# Patient Record
Sex: Female | Born: 1955
Health system: Southern US, Community
[De-identification: ages and names within clinical notes are randomized; demographics above are authoritative.]

## PROBLEM LIST (undated history)

## (undated) DIAGNOSIS — Z86011 Personal history of benign neoplasm of the brain: Secondary | ICD-10-CM

## (undated) DIAGNOSIS — E785 Hyperlipidemia, unspecified: Secondary | ICD-10-CM

## (undated) DIAGNOSIS — R112 Nausea with vomiting, unspecified: Secondary | ICD-10-CM

## (undated) DIAGNOSIS — I491 Atrial premature depolarization: Secondary | ICD-10-CM

## (undated) DIAGNOSIS — O24419 Gestational diabetes mellitus in pregnancy, unspecified control: Secondary | ICD-10-CM

## (undated) DIAGNOSIS — R55 Syncope and collapse: Secondary | ICD-10-CM

## (undated) DIAGNOSIS — H9192 Unspecified hearing loss, left ear: Secondary | ICD-10-CM

## (undated) DIAGNOSIS — G43909 Migraine, unspecified, not intractable, without status migrainosus: Secondary | ICD-10-CM

## (undated) DIAGNOSIS — I4891 Unspecified atrial fibrillation: Secondary | ICD-10-CM

## (undated) DIAGNOSIS — Z9889 Other specified postprocedural states: Secondary | ICD-10-CM

## (undated) DIAGNOSIS — K219 Gastro-esophageal reflux disease without esophagitis: Secondary | ICD-10-CM

## (undated) DIAGNOSIS — G709 Myoneural disorder, unspecified: Secondary | ICD-10-CM

## (undated) DIAGNOSIS — I471 Supraventricular tachycardia, unspecified: Secondary | ICD-10-CM

## (undated) DIAGNOSIS — T783XXA Angioneurotic edema, initial encounter: Secondary | ICD-10-CM

## (undated) DIAGNOSIS — R51 Headache: Secondary | ICD-10-CM

## (undated) DIAGNOSIS — M952 Other acquired deformity of head: Secondary | ICD-10-CM

## (undated) DIAGNOSIS — N3010 Interstitial cystitis (chronic) without hematuria: Secondary | ICD-10-CM

## (undated) DIAGNOSIS — E039 Hypothyroidism, unspecified: Secondary | ICD-10-CM

## (undated) DIAGNOSIS — I499 Cardiac arrhythmia, unspecified: Secondary | ICD-10-CM

## (undated) DIAGNOSIS — I493 Ventricular premature depolarization: Secondary | ICD-10-CM

## (undated) DIAGNOSIS — L509 Urticaria, unspecified: Secondary | ICD-10-CM

## (undated) HISTORY — DX: Supraventricular tachycardia: I47.1

## (undated) HISTORY — DX: Urticaria, unspecified: L50.9

## (undated) HISTORY — PX: OTHER SURGICAL HISTORY: SHX169

## (undated) HISTORY — DX: Atrial premature depolarization: I49.1

## (undated) HISTORY — DX: Angioneurotic edema, initial encounter: T78.3XXA

## (undated) HISTORY — PX: BRAIN SURGERY: SHX531

## (undated) HISTORY — DX: Supraventricular tachycardia, unspecified: I47.10

## (undated) HISTORY — PX: OVARIAN CYST SURGERY: SHX726

## (undated) HISTORY — DX: Ventricular premature depolarization: I49.3

---

## 1978-06-02 HISTORY — PX: WISDOM TOOTH EXTRACTION: SHX21

## 2002-06-02 HISTORY — PX: ACOUSTIC NEUROMA RESECTION: SHX5713

## 2003-06-03 HISTORY — PX: HEMANGIOMA EXCISION: SHX1734

## 2003-06-03 HISTORY — PX: MASTOIDECTOMY: SHX711

## 2010-05-16 ENCOUNTER — Ambulatory Visit
Admission: RE | Admit: 2010-05-16 | Discharge: 2010-05-16 | Payer: Self-pay | Source: Home / Self Care | Attending: Urology | Admitting: Urology

## 2010-08-12 LAB — POCT HEMOGLOBIN-HEMACUE: Hemoglobin: 13.1 g/dL (ref 12.0–15.0)

## 2013-06-10 ENCOUNTER — Other Ambulatory Visit: Payer: Self-pay | Admitting: Obstetrics and Gynecology

## 2013-06-10 DIAGNOSIS — Z1231 Encounter for screening mammogram for malignant neoplasm of breast: Secondary | ICD-10-CM

## 2013-07-18 ENCOUNTER — Telehealth (HOSPITAL_COMMUNITY): Payer: Self-pay | Admitting: *Deleted

## 2013-07-18 NOTE — Telephone Encounter (Signed)
Telephoned patient at home # and left message to return call to [facility] 

## 2013-07-19 ENCOUNTER — Ambulatory Visit (HOSPITAL_COMMUNITY): Payer: Self-pay

## 2013-07-19 ENCOUNTER — Ambulatory Visit (HOSPITAL_COMMUNITY): Payer: Self-pay | Attending: Obstetrics and Gynecology

## 2013-07-20 ENCOUNTER — Telehealth (HOSPITAL_COMMUNITY): Payer: Self-pay | Admitting: *Deleted

## 2013-07-20 NOTE — Telephone Encounter (Signed)
Telephoned patient at home # and left message to return call to [facility] 

## 2013-07-26 ENCOUNTER — Ambulatory Visit (HOSPITAL_COMMUNITY)
Admission: RE | Admit: 2013-07-26 | Discharge: 2013-07-26 | Disposition: A | Discharge status: Home / Self Care | Payer: Self-pay | Source: Ambulatory Visit | Attending: Obstetrics and Gynecology | Admitting: Obstetrics and Gynecology

## 2013-07-26 ENCOUNTER — Ambulatory Visit (HOSPITAL_COMMUNITY): Payer: Self-pay | Attending: Obstetrics and Gynecology

## 2013-07-26 NOTE — Progress Notes (Signed)
Opened in error by Mayo Ao, RN.

## 2013-08-16 ENCOUNTER — Ambulatory Visit (HOSPITAL_COMMUNITY)
Admission: RE | Admit: 2013-08-16 | Discharge: 2013-08-16 | Disposition: A | Discharge status: Home / Self Care | Payer: Self-pay | Source: Ambulatory Visit | Attending: Obstetrics and Gynecology | Admitting: Obstetrics and Gynecology

## 2013-08-16 ENCOUNTER — Other Ambulatory Visit: Payer: Self-pay | Admitting: Obstetrics and Gynecology

## 2013-08-16 ENCOUNTER — Encounter (HOSPITAL_COMMUNITY): Payer: Self-pay

## 2013-08-16 VITALS — BP 110/64 | Temp 98.3°F | Ht 71.0 in | Wt 190.8 lb

## 2013-08-16 DIAGNOSIS — Z1239 Encounter for other screening for malignant neoplasm of breast: Secondary | ICD-10-CM

## 2013-08-16 DIAGNOSIS — Z1231 Encounter for screening mammogram for malignant neoplasm of breast: Secondary | ICD-10-CM

## 2013-08-16 NOTE — Patient Instructions (Signed)
Taught Kristin Bradford how to perform BSE and gave educational materials to take home. Patient did not need a Pap smear today due to last Pap smear was in February 2014 per patient. Told patient about free cervical cancer screenings to receive a Pap smear if would like one next year. Let her know [facility] will cover Pap smears every 3 years unless has a history of abnormal Pap smears. Let [facility] know [facility] will [facility] with her within the next couple weeks with results. [name] verbalized understanding. Patient escorted to mammography for a screening mammogram.  [place], Arvil Chaco, RN 2:54 PM

## 2013-08-16 NOTE — Progress Notes (Signed)
No complaints today.  Pap Smear:  Pap smear not completed today. Last Pap smear was February 2014 at [facility] and normal per patient. Per patient has a history of an abnormal Pap smear 30 years ago that required a colposcopy and [facility] for follow-up. Patient stated all Pap smears since the CKC have been normal. No Pap smear results in EPIC.  Physical exam: Breasts Breasts symmetrical. No skin abnormalities bilateral breasts. No nipple retraction bilateral breasts. No nipple discharge bilateral breasts. No lymphadenopathy. No lumps palpated bilateral breasts. No complaints of pain or tenderness on exam. Patient escorted to mammography for a screening mammogram.        Pelvic/Bimanual No Pap smear completed today since last Pap smear was February 2014 per patient. Pap smear not indicated per [facility] guidelines.

## 2013-12-01 ENCOUNTER — Other Ambulatory Visit: Payer: Self-pay | Admitting: Family Medicine

## 2013-12-01 DIAGNOSIS — D333 Benign neoplasm of cranial nerves: Secondary | ICD-10-CM

## 2013-12-12 ENCOUNTER — Other Ambulatory Visit: Payer: Self-pay

## 2014-01-31 HISTORY — PX: CATARACT EXTRACTION W/ INTRAOCULAR LENS IMPLANT: SHX1309

## 2014-04-03 ENCOUNTER — Encounter (HOSPITAL_COMMUNITY): Payer: Self-pay

## 2014-05-03 ENCOUNTER — Other Ambulatory Visit: Payer: Self-pay | Admitting: Family Medicine

## 2014-05-03 ENCOUNTER — Other Ambulatory Visit (HOSPITAL_COMMUNITY)
Admission: RE | Admit: 2014-05-03 | Discharge: 2014-05-03 | Disposition: A | Discharge status: Home / Self Care | Payer: PRIVATE HEALTH INSURANCE | Source: Ambulatory Visit | Attending: Family Medicine | Admitting: Family Medicine

## 2014-05-03 DIAGNOSIS — Z1151 Encounter for screening for human papillomavirus (HPV): Secondary | ICD-10-CM | POA: Insufficient documentation

## 2014-05-03 DIAGNOSIS — Z124 Encounter for screening for malignant neoplasm of cervix: Secondary | ICD-10-CM | POA: Diagnosis present

## 2014-05-04 ENCOUNTER — Other Ambulatory Visit: Payer: Self-pay | Admitting: Family Medicine

## 2014-05-04 DIAGNOSIS — D259 Leiomyoma of uterus, unspecified: Secondary | ICD-10-CM

## 2014-05-04 DIAGNOSIS — Z86018 Personal history of other benign neoplasm: Secondary | ICD-10-CM

## 2014-05-05 LAB — CYTOLOGY - PAP

## 2014-05-08 ENCOUNTER — Ambulatory Visit
Admission: RE | Admit: 2014-05-08 | Discharge: 2014-05-08 | Disposition: A | Discharge status: Home / Self Care | Payer: PRIVATE HEALTH INSURANCE | Source: Ambulatory Visit | Attending: Family Medicine | Admitting: Family Medicine

## 2014-05-08 DIAGNOSIS — D259 Leiomyoma of uterus, unspecified: Secondary | ICD-10-CM

## 2014-05-10 ENCOUNTER — Ambulatory Visit
Admission: RE | Admit: 2014-05-10 | Discharge: 2014-05-10 | Disposition: A | Discharge status: Home / Self Care | Payer: PRIVATE HEALTH INSURANCE | Source: Ambulatory Visit | Attending: Family Medicine | Admitting: Family Medicine

## 2014-05-10 DIAGNOSIS — Z86018 Personal history of other benign neoplasm: Secondary | ICD-10-CM

## 2014-05-10 MED ORDER — GADOBENATE DIMEGLUMINE 529 MG/ML IV SOLN
17.0000 mL | Freq: Once | INTRAVENOUS | Status: AC | PRN
  Administered 2014-05-10: 17 mL via INTRAVENOUS

## 2014-11-01 HISTORY — PX: COLONOSCOPY: SHX174

## 2014-11-01 HISTORY — PX: ESOPHAGOGASTRODUODENOSCOPY: SHX1529

## 2015-05-02 ENCOUNTER — Other Ambulatory Visit: Payer: Self-pay | Admitting: Urology

## 2015-05-03 HISTORY — PX: HEMORRHOID BANDING: SHX5850

## 2015-05-03 MED ORDER — TRIAMCINOLONE ACETONIDE 40 MG/ML IJ SUSP: Freq: Once | INTRAMUSCULAR | Status: AC

## 2015-05-03 MED ORDER — PHENAZOPYRIDINE HCL 200 MG PO TABS: Freq: Once | ORAL | Status: AC

## 2015-05-16 ENCOUNTER — Encounter (HOSPITAL_BASED_OUTPATIENT_CLINIC_OR_DEPARTMENT_OTHER): Payer: Self-pay | Admitting: *Deleted

## 2015-05-17 NOTE — Progress Notes (Signed)
Unable to reach pt.  Lm on pt phone to arrive at [place] and npo after mn.  From current hx pt would need hg.

## 2015-05-18 ENCOUNTER — Other Ambulatory Visit: Payer: Self-pay

## 2015-05-18 ENCOUNTER — Encounter (HOSPITAL_COMMUNITY)
Admission: AD | Disposition: A | Discharge status: Home / Self Care | Payer: Self-pay | Source: Ambulatory Visit | Attending: Internal Medicine

## 2015-05-18 ENCOUNTER — Ambulatory Visit (HOSPITAL_BASED_OUTPATIENT_CLINIC_OR_DEPARTMENT_OTHER): Payer: PRIVATE HEALTH INSURANCE | Admitting: Anesthesiology

## 2015-05-18 ENCOUNTER — Inpatient Hospital Stay (HOSPITAL_BASED_OUTPATIENT_CLINIC_OR_DEPARTMENT_OTHER)
Admission: AD | Admit: 2015-05-18 | Discharge: 2015-05-19 | DRG: 982 | Disposition: A | Discharge status: Home / Self Care | Payer: PRIVATE HEALTH INSURANCE | Source: Ambulatory Visit | Attending: Internal Medicine | Admitting: Internal Medicine

## 2015-05-18 ENCOUNTER — Encounter (HOSPITAL_BASED_OUTPATIENT_CLINIC_OR_DEPARTMENT_OTHER): Payer: Self-pay | Admitting: Anesthesiology

## 2015-05-18 DIAGNOSIS — E039 Hypothyroidism, unspecified: Secondary | ICD-10-CM

## 2015-05-18 DIAGNOSIS — Z8632 Personal history of gestational diabetes: Secondary | ICD-10-CM

## 2015-05-18 DIAGNOSIS — Z888 Allergy status to other drugs, medicaments and biological substances status: Secondary | ICD-10-CM

## 2015-05-18 DIAGNOSIS — Z886 Allergy status to analgesic agent status: Secondary | ICD-10-CM

## 2015-05-18 DIAGNOSIS — Z880 Allergy status to penicillin: Secondary | ICD-10-CM

## 2015-05-18 DIAGNOSIS — R682 Dry mouth, unspecified: Secondary | ICD-10-CM | POA: Diagnosis present

## 2015-05-18 DIAGNOSIS — I9789 Other postprocedural complications and disorders of the circulatory system, not elsewhere classified: Principal | ICD-10-CM | POA: Diagnosis present

## 2015-05-18 DIAGNOSIS — D259 Leiomyoma of uterus, unspecified: Secondary | ICD-10-CM | POA: Diagnosis present

## 2015-05-18 DIAGNOSIS — Z91041 Radiographic dye allergy status: Secondary | ICD-10-CM

## 2015-05-18 DIAGNOSIS — Z86011 Personal history of benign neoplasm of the brain: Secondary | ICD-10-CM | POA: Diagnosis not present

## 2015-05-18 DIAGNOSIS — N3010 Interstitial cystitis (chronic) without hematuria: Secondary | ICD-10-CM | POA: Diagnosis present

## 2015-05-18 DIAGNOSIS — H918X2 Other specified hearing loss, left ear: Secondary | ICD-10-CM | POA: Diagnosis present

## 2015-05-18 DIAGNOSIS — I4891 Unspecified atrial fibrillation: Secondary | ICD-10-CM | POA: Diagnosis not present

## 2015-05-18 DIAGNOSIS — Z635 Disruption of family by separation and divorce: Secondary | ICD-10-CM

## 2015-05-18 DIAGNOSIS — Y838 Other surgical procedures as the cause of abnormal reaction of the patient, or of later complication, without mention of misadventure at the time of the procedure: Secondary | ICD-10-CM | POA: Diagnosis present

## 2015-05-18 DIAGNOSIS — F419 Anxiety disorder, unspecified: Secondary | ICD-10-CM | POA: Diagnosis present

## 2015-05-18 DIAGNOSIS — I480 Paroxysmal atrial fibrillation: Secondary | ICD-10-CM | POA: Diagnosis not present

## 2015-05-18 DIAGNOSIS — Z887 Allergy status to serum and vaccine status: Secondary | ICD-10-CM

## 2015-05-18 DIAGNOSIS — Z91013 Allergy to seafood: Secondary | ICD-10-CM

## 2015-05-18 DIAGNOSIS — Z885 Allergy status to narcotic agent status: Secondary | ICD-10-CM | POA: Diagnosis not present

## 2015-05-18 DIAGNOSIS — E785 Hyperlipidemia, unspecified: Secondary | ICD-10-CM | POA: Diagnosis present

## 2015-05-18 DIAGNOSIS — D333 Benign neoplasm of cranial nerves: Secondary | ICD-10-CM

## 2015-05-18 DIAGNOSIS — K219 Gastro-esophageal reflux disease without esophagitis: Secondary | ICD-10-CM | POA: Diagnosis present

## 2015-05-18 DIAGNOSIS — K2210 Ulcer of esophagus without bleeding: Secondary | ICD-10-CM | POA: Diagnosis present

## 2015-05-18 HISTORY — DX: Other acquired deformity of head: M95.2

## 2015-05-18 HISTORY — DX: Benign neoplasm of cranial nerves: D33.3

## 2015-05-18 HISTORY — PX: CYSTO WITH HYDRODISTENSION: SHX5453

## 2015-05-18 HISTORY — DX: Interstitial cystitis (chronic) without hematuria: N30.10

## 2015-05-18 HISTORY — DX: Personal history of benign neoplasm of the brain: Z86.011

## 2015-05-18 HISTORY — DX: Unspecified hearing loss, left ear: H91.92

## 2015-05-18 HISTORY — DX: Unspecified atrial fibrillation: I48.91

## 2015-05-18 LAB — COMPREHENSIVE METABOLIC PANEL
ALBUMIN: 4 g/dL (ref 3.5–5.0)
ALT: 19 U/L (ref 14–54)
AST: 34 U/L (ref 15–41)
Alkaline Phosphatase: 46 U/L (ref 38–126)
Anion gap: 11 (ref 5–15)
BUN: 11 mg/dL (ref 6–20)
CHLORIDE: 101 mmol/L (ref 101–111)
CO2: 27 mmol/L (ref 22–32)
CREATININE: 0.77 mg/dL (ref 0.44–1.00)
Calcium: 8.8 mg/dL — ABNORMAL LOW (ref 8.9–10.3)
GFR calc Af Amer: >60 mL/min (ref >60)
GLUCOSE: 167 mg/dL — AB (ref 65–99)
POTASSIUM: 3.7 mmol/L (ref 3.5–5.1)
Sodium: 139 mmol/L (ref 135–145)
Total Bilirubin: 0.8 mg/dL (ref 0.3–1.2)
Total Protein: 7.1 g/dL (ref 6.5–8.1)

## 2015-05-18 LAB — T4, FREE: Free T4: 0.86 ng/dL (ref 0.61–1.12)

## 2015-05-18 LAB — POCT I-STAT, CHEM 8
BUN: 15 mg/dL (ref 6–20)
Calcium, Ion: 1.21 mmol/L (ref 1.12–1.23)
Chloride: 104 mmol/L (ref 101–111)
Creatinine, Ser: 0.7 mg/dL (ref 0.44–1.00)
GLUCOSE: 100 mg/dL — AB (ref 65–99)
HCT: 43 % (ref 36.0–46.0)
HEMOGLOBIN: 14.6 g/dL (ref 12.0–15.0)
POTASSIUM: 3.9 mmol/L (ref 3.5–5.1)
Sodium: 143 mmol/L (ref 135–145)
TCO2: 26 mmol/L (ref 0–100)

## 2015-05-18 LAB — CBC WITH DIFFERENTIAL/PLATELET
Basophils Absolute: 0 10*3/uL (ref 0.0–0.1)
Basophils Relative: 0 %
EOS ABS: 0 10*3/uL (ref 0.0–0.7)
EOS PCT: 0 %
HCT: 39.1 % (ref 36.0–46.0)
Hemoglobin: 13 g/dL (ref 12.0–15.0)
LYMPHS ABS: 0.4 10*3/uL — AB (ref 0.7–4.0)
LYMPHS PCT: 5 %
MCH: 29.2 pg (ref 26.0–34.0)
MCHC: 33.2 g/dL (ref 30.0–36.0)
MCV: 87.9 fL (ref 78.0–100.0)
MONO ABS: 0 10*3/uL — AB (ref 0.1–1.0)
MONOS PCT: 0 %
Neutro Abs: 6.6 10*3/uL (ref 1.7–7.7)
Neutrophils Relative %: 95 %
PLATELETS: 257 10*3/uL (ref 150–400)
RBC: 4.45 MIL/uL (ref 3.87–5.11)
RDW: 12.9 % (ref 11.5–15.5)
WBC: 7 10*3/uL (ref 4.0–10.5)

## 2015-05-18 LAB — MRSA PCR SCREENING: MRSA BY PCR: NEGATIVE

## 2015-05-18 LAB — TROPONIN I

## 2015-05-18 LAB — TSH: TSH: 0.469 u[IU]/mL (ref 0.350–4.500)

## 2015-05-18 LAB — MAGNESIUM: MAGNESIUM: 1.8 mg/dL (ref 1.7–2.4)

## 2015-05-18 SURGERY — CYSTOSCOPY, WITH BLADDER HYDRODISTENSION
Anesthesia: General

## 2015-05-18 MED ORDER — DILTIAZEM HCL 30 MG PO TABS
30.0000 mg | ORAL_TABLET | Freq: Three times a day (TID) | ORAL | Status: DC
  Administered 2015-05-18 – 2015-05-19 (×3): 30 mg via ORAL
  Filled 2015-05-18 (×4): qty 1

## 2015-05-18 MED ORDER — PROPOFOL 10 MG/ML IV BOLUS
INTRAVENOUS | Status: DC | PRN
  Administered 2015-05-18: 200 mg via INTRAVENOUS

## 2015-05-18 MED ORDER — ACETAMINOPHEN 500 MG PO TABS
ORAL_TABLET | ORAL | Status: AC
  Filled 2015-05-18: qty 2

## 2015-05-18 MED ORDER — DILTIAZEM LOAD VIA INFUSION
10.0000 mg | Freq: Once | INTRAVENOUS | Status: DC
  Filled 2015-05-18 (×2): qty 10

## 2015-05-18 MED ORDER — STERILE WATER FOR IRRIGATION IR SOLN
Status: DC | PRN
  Administered 2015-05-18: 3000 mL

## 2015-05-18 MED ORDER — LIDOCAINE HCL (CARDIAC) 20 MG/ML IV SOLN
INTRAVENOUS | Status: AC
  Filled 2015-05-18: qty 5

## 2015-05-18 MED ORDER — LEVOTHYROXINE SODIUM 50 MCG PO TABS
50.0000 ug | ORAL_TABLET | Freq: Every day | ORAL | Status: DC
  Administered 2015-05-19: 50 ug via ORAL
  Filled 2015-05-18: qty 1

## 2015-05-18 MED ORDER — METOCLOPRAMIDE HCL 5 MG/ML IJ SOLN
INTRAMUSCULAR | Status: AC
  Filled 2015-05-18: qty 2

## 2015-05-18 MED ORDER — ACETAMINOPHEN 10 MG/ML IV SOLN
INTRAVENOUS | Status: AC
  Filled 2015-05-18: qty 100

## 2015-05-18 MED ORDER — TRIAMCINOLONE ACETONIDE 40 MG/ML IJ SUSP
INTRAMUSCULAR | Status: DC | PRN
  Administered 2015-05-18: 40 mg via INTRAMUSCULAR

## 2015-05-18 MED ORDER — KETOROLAC TROMETHAMINE 30 MG/ML IJ SOLN
INTRAMUSCULAR | Status: AC
  Filled 2015-05-18: qty 1

## 2015-05-18 MED ORDER — BUPIVACAINE HCL 0.5 % IJ SOLN
INTRAMUSCULAR | Status: DC | PRN
  Administered 2015-05-18: 10 mL

## 2015-05-18 MED ORDER — LIDOCAINE HCL (CARDIAC) 20 MG/ML IV SOLN
INTRAVENOUS | Status: DC | PRN
  Administered 2015-05-18: 60 mg via INTRAVENOUS

## 2015-05-18 MED ORDER — PANTOPRAZOLE SODIUM 40 MG PO TBEC
40.0000 mg | DELAYED_RELEASE_TABLET | Freq: Every day | ORAL | Status: DC
  Administered 2015-05-18: 40 mg via ORAL
  Filled 2015-05-18: qty 1

## 2015-05-18 MED ORDER — CIPROFLOXACIN IN D5W 400 MG/200ML IV SOLN
INTRAVENOUS | Status: AC
  Filled 2015-05-18: qty 200

## 2015-05-18 MED ORDER — DEXAMETHASONE SODIUM PHOSPHATE 4 MG/ML IJ SOLN
INTRAMUSCULAR | Status: DC | PRN
Start: 2015-05-18 — End: 2015-05-18
  Administered 2015-05-18: 10 mg via INTRAVENOUS

## 2015-05-18 MED ORDER — DEXAMETHASONE SODIUM PHOSPHATE 10 MG/ML IJ SOLN
INTRAMUSCULAR | Status: AC
  Filled 2015-05-18: qty 1

## 2015-05-18 MED ORDER — ONDANSETRON HCL 4 MG/2ML IJ SOLN
INTRAMUSCULAR | Status: AC
  Filled 2015-05-18: qty 2

## 2015-05-18 MED ORDER — HYDROMORPHONE HCL 1 MG/ML IJ SOLN
0.2500 mg | INTRAMUSCULAR | Status: DC | PRN
  Filled 2015-05-18: qty 1

## 2015-05-18 MED ORDER — PRAVASTATIN SODIUM 20 MG PO TABS
20.0000 mg | ORAL_TABLET | Freq: Every day | ORAL | Status: DC
  Administered 2015-05-18: 20 mg via ORAL
  Filled 2015-05-18: qty 1

## 2015-05-18 MED ORDER — KETOROLAC TROMETHAMINE 30 MG/ML IJ SOLN
INTRAMUSCULAR | Status: DC | PRN
  Administered 2015-05-18: 30 mg via INTRAVENOUS

## 2015-05-18 MED ORDER — DEXTROSE 5 % IV SOLN: 5.0000 mg/h | INTRAVENOUS | Status: DC

## 2015-05-18 MED ORDER — PHENAZOPYRIDINE HCL 200 MG PO TABS
ORAL | Status: DC | PRN
  Administered 2015-05-18: 15 mL via INTRAVESICAL

## 2015-05-18 MED ORDER — LABETALOL HCL 5 MG/ML IV SOLN
INTRAVENOUS | Status: AC
  Filled 2015-05-18: qty 4

## 2015-05-18 MED ORDER — HYDROMORPHONE HCL 4 MG/ML IJ SOLN
INTRAMUSCULAR | Status: AC
  Filled 2015-05-18: qty 1

## 2015-05-18 MED ORDER — MIDAZOLAM HCL 5 MG/5ML IJ SOLN
INTRAMUSCULAR | Status: DC | PRN
  Administered 2015-05-18: 2 mg via INTRAVENOUS

## 2015-05-18 MED ORDER — LACTATED RINGERS IV SOLN
1000.0000 mL | INTRAVENOUS | Status: DC
  Administered 2015-05-18: 1000 mL via INTRAVENOUS

## 2015-05-18 MED ORDER — BUPIVACAINE HCL (PF) 0.25 % IJ SOLN
INTRAMUSCULAR | Status: AC
  Filled 2015-05-18: qty 30

## 2015-05-18 MED ORDER — ENOXAPARIN SODIUM 40 MG/0.4ML ~~LOC~~ SOLN
40.0000 mg | SUBCUTANEOUS | Status: DC
  Filled 2015-05-18: qty 0.4

## 2015-05-18 MED ORDER — FENTANYL CITRATE (PF) 100 MCG/2ML IJ SOLN
INTRAMUSCULAR | Status: AC
  Filled 2015-05-18: qty 2

## 2015-05-18 MED ORDER — DILTIAZEM HCL 100 MG IV SOLR
5.0000 mg/h | INTRAVENOUS | Status: DC
  Administered 2015-05-18: 15 mg/h via INTRAVENOUS
  Administered 2015-05-18: 10 mg/h via INTRAVENOUS
  Filled 2015-05-18 (×2): qty 100

## 2015-05-18 MED ORDER — ONDANSETRON HCL 4 MG/2ML IJ SOLN: 4.0000 mg | Freq: Four times a day (QID) | INTRAMUSCULAR | Status: DC | PRN

## 2015-05-18 MED ORDER — ACETAMINOPHEN 325 MG PO TABS: 650.0000 mg | ORAL_TABLET | Freq: Four times a day (QID) | ORAL | Status: DC | PRN

## 2015-05-18 MED ORDER — DILTIAZEM HCL 100 MG IV SOLR: 10.0000 mg/h | INTRAVENOUS | Status: DC

## 2015-05-18 MED ORDER — PROMETHAZINE HCL 25 MG/ML IJ SOLN
6.2500 mg | INTRAMUSCULAR | Status: DC | PRN
  Filled 2015-05-18: qty 1

## 2015-05-18 MED ORDER — ACETAMINOPHEN 500 MG PO TABS
1000.0000 mg | ORAL_TABLET | Freq: Once | ORAL | Status: AC
  Administered 2015-05-18: 1000 mg via ORAL
  Filled 2015-05-18: qty 2

## 2015-05-18 MED ORDER — BUPIVACAINE HCL (PF) 0.5 % IJ SOLN
INTRAMUSCULAR | Status: AC
  Filled 2015-05-18: qty 30

## 2015-05-18 MED ORDER — MIDAZOLAM HCL 2 MG/2ML IJ SOLN
INTRAMUSCULAR | Status: AC
  Filled 2015-05-18: qty 2

## 2015-05-18 MED ORDER — ONDANSETRON HCL 4 MG/2ML IJ SOLN
4.0000 mg | Freq: Once | INTRAMUSCULAR | Status: AC
  Administered 2015-05-18: 4 mg via INTRAVENOUS
  Filled 2015-05-18: qty 2

## 2015-05-18 MED ORDER — METOCLOPRAMIDE HCL 5 MG/ML IJ SOLN
INTRAMUSCULAR | Status: DC | PRN
  Administered 2015-05-18: 10 mg via INTRAVENOUS

## 2015-05-18 MED ORDER — ACETAMINOPHEN 325 MG PO TABS: 650.0000 mg | ORAL_TABLET | ORAL | Status: DC | PRN

## 2015-05-18 MED ORDER — ONDANSETRON HCL 4 MG/2ML IJ SOLN
INTRAMUSCULAR | Status: DC | PRN
  Administered 2015-05-18: 4 mg via INTRAVENOUS

## 2015-05-18 MED ORDER — DEXLANSOPRAZOLE 60 MG PO CPDR
60.0000 mg | DELAYED_RELEASE_CAPSULE | Freq: Every day | ORAL | Status: DC
  Administered 2015-05-19: 60 mg via ORAL
  Filled 2015-05-18 (×3): qty 1

## 2015-05-18 MED ORDER — LABETALOL HCL 5 MG/ML IV SOLN
2.5000 mg | Freq: Once | INTRAVENOUS | Status: AC
  Administered 2015-05-18: 2.5 mg via INTRAVENOUS
  Filled 2015-05-18: qty 4

## 2015-05-18 MED ORDER — LACTATED RINGERS IV SOLN
INTRAVENOUS | Status: DC
  Administered 2015-05-18 (×2): via INTRAVENOUS
  Filled 2015-05-18: qty 1000

## 2015-05-18 MED ORDER — CIPROFLOXACIN IN D5W 400 MG/200ML IV SOLN
400.0000 mg | INTRAVENOUS | Status: AC
  Administered 2015-05-18: 400 mg via INTRAVENOUS
  Filled 2015-05-18: qty 200

## 2015-05-18 MED ORDER — HYDROMORPHONE HCL 1 MG/ML IJ SOLN
INTRAMUSCULAR | Status: DC | PRN
  Administered 2015-05-18: 1 mg via INTRAVENOUS

## 2015-05-18 MED ORDER — PROPOFOL 10 MG/ML IV BOLUS
INTRAVENOUS | Status: AC
  Filled 2015-05-18: qty 20

## 2015-05-18 SURGICAL SUPPLY — 30 items
ADAPTER CATH WHT DISP STRL (CATHETERS) IMPLANT
ADPR CATH MP STRL LF DISP BD (CATHETERS)
BAG DRAIN URO-CYSTO SKYTR STRL (DRAIN) ×3 IMPLANT
BAG DRN UROCATH (DRAIN) ×1
BOOTIES KNEE HIGH SLOAN (MISCELLANEOUS) ×3 IMPLANT
CANISTER SUCT LVC 12 LTR MEDI- (MISCELLANEOUS) IMPLANT
CATH ROBINSON RED A/P 16FR (CATHETERS) IMPLANT
CLOTH BEACON ORANGE TIMEOUT ST (SAFETY) ×3 IMPLANT
ELECT REM PT RETURN 9FT ADLT (ELECTROSURGICAL) ×3
ELECTRODE REM PT RTRN 9FT ADLT (ELECTROSURGICAL) ×1 IMPLANT
GLOVE BIO SURGEON STRL SZ7.5 (GLOVE) ×3 IMPLANT
GLOVE ECLIPSE 8.0 STRL XLNG CF (GLOVE) IMPLANT
GOWN STRL REUS W/ TWL LRG LVL3 (GOWN DISPOSABLE) ×1 IMPLANT
GOWN STRL REUS W/ TWL XL LVL3 (GOWN DISPOSABLE) ×1 IMPLANT
GOWN STRL REUS W/TWL LRG LVL3 (GOWN DISPOSABLE) ×3
GOWN STRL REUS W/TWL XL LVL3 (GOWN DISPOSABLE) ×3
KIT ROOM TURNOVER WOR (KITS) ×3 IMPLANT
MANIFOLD NEPTUNE II (INSTRUMENTS) IMPLANT
NDL SAFETY ECLIPSE 18X1.5 (NEEDLE) ×1 IMPLANT
NDL SPNL 22GX7 QUINCKE BK (NEEDLE) IMPLANT
NEEDLE HYPO 18GX1.5 SHARP (NEEDLE) ×3
NEEDLE HYPO 22GX1.5 SAFETY (NEEDLE) IMPLANT
NEEDLE SPNL 22GX7 QUINCKE BK (NEEDLE) IMPLANT
NS IRRIG 500ML POUR BTL (IV SOLUTION) IMPLANT
PACK CYSTO (CUSTOM PROCEDURE TRAY) ×3 IMPLANT
SYR 20CC LL (SYRINGE) ×6 IMPLANT
SYR BULB IRRIGATION 50ML (SYRINGE) IMPLANT
TUBE CONNECTING 12'X1/4 (SUCTIONS)
TUBE CONNECTING 12X1/4 (SUCTIONS) IMPLANT
WATER STERILE IRR 3000ML UROMA (IV SOLUTION) ×3 IMPLANT

## 2015-05-18 NOTE — Progress Notes (Signed)
Ms. Owens underwent uneventful cystoscopy and hydro-distension this morning. Postoperatively, she complained of nausea. Her HR and BP were normal and she was moved from first stage to second stage recovery(up in a chair and not monitored) in expectation of discharge. About 1305, I was asked to see her for continued nausea. She stated she did not feel well and felt like her heart rate was elevated. She was placed back on cardiac monitor which showed HR about 130 and irregular. Labetalol 2.5 mg IV ordered and after five minutes of no effect a 12 lead ECG was ordered which showed apparent atrial fibrillation at rate of 128. Blood pressure was 137/83 at this time.  Blood pressure has remained stable and she denies SOB. Carotid massage and coughing were ineffective. A diltiazem bolus and infusion were ordered. Oxygen administered.  Dr. Gaynelle Arabian was informed of need for admission.  Hospitalist (Dr. Dyann Kief) was consulted and will accept patient once she is transferred to [facility] stepdown unit.

## 2015-05-18 NOTE — H&P (Signed)
Active Problems Problems  1. Chronic interstitial cystitis without hematuria (N30.10) 2. Increased urinary frequency (R35.0) 3. Urinary urgency (R39.15)  History of Present Illness    Kristin Bradford is a 59 year-old female with a hx of IC. Previous PUFF score of 16, and now PUFF=15 in 2016. and a complaint " IC flair". She has urinary frequency, difficulty postponing urination, dysuria, and nocturia, as well as weak urinary stream. She has GERD, anxiety, and arrhythmia, history of gestational diabetes, hypothyroidism,.      Note that she is postop acoustic neuroma removal on 2004, with chronic dry mouth. Note that she has particular stressors including recent marital separation, brother with cancer, and mother worsening dementia. In addition she has GERD, fatigue, and headaches.      Urodynamics in the past have shown a maximum bladder capacity of 202 cc. The bladder is hypersensitive, with first sensation at 83 cc. The lead point pressure is non-an abdominal pressure of 105 cm water. The ECG is normal. Fluoroscopy was not accomplished secondary to iodine allergy and seafood allergy.     This patient appears to have a small capacity, hypersensitive bladder. She had pain with catheterization, and a maximum capacity. There is no instability, no stress incontinence, normal EMG.     She has not taken Buspar. Biggest problem is frequency and urgency. She failed Vesicare because of headache.   Past Medical History Problems  1. History of Anxiety (F41.9) 2. History of Hearing Loss 3. History of cardiac arrhythmia (Z86.79) 4. History of diabetes mellitus (Z86.39) 5. History of esophageal reflux (Z87.19) 6. History of hypercholesterolemia (Z86.39) 7. History of hypothyroidism (Z86.39)  Surgical History Problems  1. History of Bladder Irrigation 2. History of Complete Colonoscopy 3. History of Cystoscopy With Dilation Of Bladder 4. History of Cystoscopy With Dilation Of Bladder 5. History of Dental  Surgery 6. History of Dilation And Curettage 7. History of Facial Surgery 8. History of Vestibular Nerve Section  Current Meds 1. Dexilant 60 MG Oral Capsule Delayed Release;  Therapy: (Recorded:30Nov2016) to Recorded 2. Levothyroxine Sodium 50 MCG Oral Tablet;  Therapy: (Recorded:02Nov2011) to Recorded 3. Melatonin TABS;  Therapy: (Recorded:02Nov2011) to Recorded 4. Pravastatin Sodium 20 MG Oral Tablet;  Therapy: (Recorded:02Nov2011) to Recorded 5. Prometrium 100 MG Oral Capsule;  Therapy: 13Sep2011 to Recorded  Allergies Medication  1. Betadine RTU SOLN 2. BuSpar TABS 3. Gelnique GEL 4. Penicillins 5. Tetanus Toxoids 6. VESIcare TABS  Family History Problems  1. Family history of Family Health Status Number Of Children   1 daughter 2. Family history of FH Unobtainable - Patient Adopted  Social History Problems  1. Alcohol Use (History)   2 per wk 2. Caffeine Use   1-2 per day 3. Never a smoker 4. Occupation:   Medical records//talent buyer/booker 5. Separated from significant other (Z63.5) 6. Denied: History of Tobacco Use  Alcohol Use (History)   - 2 per wk    Caffeine Use   - 1-2 per day    Marital History - Currently Married    Occupation:   - Water quality scientist buyer/booker   Review of Systems Genitourinary, constitutional, skin, eye, otolaryngeal, hematologic/lymphatic, cardiovascular, pulmonary, endocrine, musculoskeletal, gastrointestinal, neurological and psychiatric system(s) were reviewed and pertinent findings if present are noted and are otherwise negative.  Genitourinary: urinary frequency, urinary urgency, dysuria and nocturia, but no hematuria, no urethral discharge, no foul smelling urine and no pelvic pain and weak flow.  Gastrointestinal: heartburn, but no diarrhea and no constipation.  Constitutional: feeling tired (fatigue), but  no fever and no night sweats.  Neurological: headache.    Physical Exam Constitutional: Well  nourished and well developed . No acute distress.  ENT:. The ears and nose are normal in appearance.  Pulmonary: No respiratory distress and normal respiratory rhythm and effort.  Cardiovascular: Heart rate and rhythm are normal . No peripheral edema.  Abdomen: The abdomen is soft and nontender. No masses are palpated. No CVA tenderness. No hernias are palpable. No hepatosplenomegaly noted.  Lymphatics: The femoral and inguinal nodes are not enlarged or tender.  Skin: Normal skin turgor, no visible rash and no visible skin lesions.  Neuro/Psych:. Mood and affect are appropriate.    Assessment Assessed  1. Chronic interstitial cystitis without hematuria (N30.10) 2. Increased urinary frequency (R35.0) 3. Urinary urgency (R39.15)  Kristin Bradford is a 59 yo female with IC, and recent stressors of marital separation, brother with cancer, and mother with worsening dementia. She is living by herself,and feels the need for cysto, HOD, which has helped her IC symptoms of urinary frequency, urgency, dysuria, weak stream, and nocturia in the past, PUFF score = 15 today.    Note her additionsl problems of vestibvular surgery (L) 2004; titanium tooth implant, D and C; GERD.   Plan Cysto, HOD, place Pyridium, Marcaine, and Marcaine/Kenalog.   Signatures Electronically signed by : Carolan Clines, M.D.; May 02 2015  3:51PM EST

## 2015-05-18 NOTE — Progress Notes (Signed)
Patient c/o nausea, MD Denenny notified order for Zofran obtained. Will continue to monitor. C.Roddie Riegler,RN

## 2015-05-18 NOTE — Anesthesia Postprocedure Evaluation (Signed)
Anesthesia Post Note  Patient: Kristin Bradford  Procedure(s) Performed: Procedure(s) (LRB): CYSTOSCOPY/HYDRODISTENSION (N/A)  Patient location during evaluation: PACU Anesthesia Type: General Level of consciousness: awake and alert Pain management: pain level controlled Vital Signs Assessment: post-procedure vital signs reviewed and stable Respiratory status: spontaneous breathing, nonlabored ventilation, respiratory function stable and patient connected to nasal cannula oxygen Cardiovascular status: blood pressure returned to baseline and stable Postop Assessment: no signs of nausea or vomiting Anesthetic complications: no Comments: Nausea requiring second dose of zofran. She declined phenergan which she says can make her blood pressure fall.    Last Vitals:  Filed Vitals:   05/18/15 0747 05/18/15 0945  BP: 103/59 104/66  Pulse: 70 66  Temp: 36.9 C 36.5 C  Resp: 16 11    Last Pain: There were no vitals filed for this visit.               Keylor Rands J

## 2015-05-18 NOTE — Transfer of Care (Signed)
Immediate Anesthesia Transfer of Care Note  Patient: Kristin Bradford  Procedure(s) Performed: Procedure(s): CYSTOSCOPY/HYDRODISTENSION (N/A)  Patient Location: PACU  Anesthesia Type:General  Level of Consciousness: awake, alert , oriented and patient cooperative  Airway & Oxygen Therapy: Patient Spontanous Breathing and Patient connected to nasal cannula oxygen  Post-op Assessment: Report given to RN and Post -op Vital signs reviewed and stable  Post vital signs: Reviewed and stable  Last Vitals:  Filed Vitals:   05/18/15 0747  BP: 103/59  Pulse: 70  Temp: 36.9 C  Resp: 16    Complications: No apparent anesthesia complications

## 2015-05-18 NOTE — Progress Notes (Signed)
Ekg obtained, Dr. Delma Post in room. Placed on stretcher and moved back into recovery area and place on monitor.

## 2015-05-18 NOTE — Addendum Note (Signed)
Addendum  created 05/18/15 1356 by Franne Grip, MD   Modules edited: Orders

## 2015-05-18 NOTE — Progress Notes (Signed)
Received call from anesthesiologist Dr. Delma Post regarding Kristin Bradford who presented for Cystoscopy and HOD, due to recurrent chronic cystitis. After procedure patient developed atrial fibrillation and [facility] was called to assist with treatment and work up. Per. Dr. Delma Post VS are stable and patient main complaint is nausea. Plan is to admit her to step down for further evaluation and treatment.  Plan: -will need troponin, TSH, CBC, CMET and 2-D echo -CHADsVasc Score is 1 -patient is on cardizem drip currently  -Cardiology consult if she failed to convert or further needs  -?? Use of adenosine    -urology will follow her along  Barton Dubois [id number]

## 2015-05-18 NOTE — H&P (Signed)
[facility] History and Physical  Kristin Bradford [id number] DOB: 01-01-1956 DOA: 05/18/2015  Referring physician: Dr. Gaynelle Arabian PCP: No primary care provider on file.   Chief Complaint: new a fib  HPI: Kristin Bradford is a 59 y.o. female  With PMHx of IC, acoustic neuroma.  She was undergoing a cystoscopy today.  After the procedure, she developed nausea and dizziness.  She then described feels of her heart racing.  EKG showed atrial fibrillation with rate of 125-150.  She was given dose of labetalol.  And then started on cardiazem gtt.    Patient denies h/o atrial fib or heart racing in this same manner although, patient has h/o PVCs-- has worn a holter monitor x 2 (In [place]).  She also had a normal stress test in early 2000s.  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  Once arriving to the SDU at [facility], she converted around 4:30 back to NSR.     Hospitalist were asked to admit for atrial fib evaluation.     Review of Systems:  All systems reviewed, negative unless stated above    Past Medical History  Diagnosis Date  . IC (interstitial cystitis)   . Acquired deafness of left ear     S/P  RESECTION ACOUSTIC NEUROMA  2004  . 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 Acquired facial asymmetry     post surgery   Past Surgical History  Procedure Laterality Date  . Craniotomy w/ left acoustic neuroma removal  2004  . Left facial angioma removal /  left mastoidectomy  2005  . Cysto/  hydrodistention/  instillation therapy  x4  last one 05-16-2010    since 1990's  . Colonoscopy  JUNE 1016  . Upper gi endoscopy  JUNE 1016  . Cataract extraction w/ intraocular lens implant  SEPTEMBER 1015   Social History:  reports that she has never smoked. She has never used smokeless tobacco. She reports that she drinks alcohol. She reports that she does not use illicit drugs.  Allergies  Allergen Reactions  . Betadine [Povidone  Iodine] Rash  . Codeine   . Fentanyl Nausea And Vomiting  . Penicillins Itching  . Tetanus Toxoids Nausea And Vomiting and Other (See Comments)    High fever    Family History  Problem Relation Age of Onset  . Adopted: Yes    Prior to Admission medications   Medication Sig Start Date End Date Taking? Authorizing Provider  acetaminophen (TYLENOL) 325 MG tablet Take 650 mg by mouth every 6 (six) hours as needed.   Yes Historical Provider, MD  dexlansoprazole (DEXILANT) 60 MG capsule Take 60 mg by mouth daily.   Yes Historical Provider, MD  levothyroxine (SYNTHROID, LEVOTHROID) 50 MCG tablet Take 50 mcg by mouth daily before breakfast.   Yes Historical Provider, MD  pravastatin (PRAVACHOL) 20 MG tablet Take 20 mg by mouth at bedtime.   Yes Historical Provider, MD  progesterone (PROMETRIUM) 100 MG capsule Take 100 mg by mouth at bedtime.   Yes Historical Provider, MD   Physical Exam: Filed Vitals:   05/18/15 1515 05/18/15 1530 05/18/15 1545 05/18/15 1600  BP: 111/69  135/77   Pulse: 142 135 137 145  Temp: 98.1 F (36.7 C)     TempSrc:      Resp: 16 16 17 14   Height:      Weight:      SpO2: 99% 100% 99% 100%  Wt Readings from Last 3 Encounters:  05/18/15 87.544 kg (193 lb)  05/10/14 83.915 kg (185 lb)  08/16/13 86.546 kg (190 lb 12.8 oz)    General:  Appears calm and comfortable Eyes: PERRL, normal lids, irises & conjunctiva ENT: grossly normal hearing, lips & tongue Neck: no LAD, masses or thyromegaly Cardiovascular: RRR, no m/r/g. No LE edema. Telemetry: currently sinus Respiratory: CTA bilaterally, no w/r/r. Normal respiratory effort. Abdomen: soft, ntnd Skin: no rash or induration seen on limited exam Musculoskeletal: grossly normal tone BUE/BLE Psychiatric: grossly normal mood and affect, speech fluent and appropriate Neurologic: grossly non-focal.          Labs on Admission:  Basic Metabolic Panel:  Recent Labs Lab 05/18/15 0817  NA 143  K 3.9  CL  104  GLUCOSE 100*  BUN 15  CREATININE 0.70   Liver Function Tests: No results for input(s): AST, ALT, ALKPHOS, BILITOT, PROT, ALBUMIN in the last 168 hours. No results for input(s): LIPASE, AMYLASE in the last 168 hours. No results for input(s): AMMONIA in the last 168 hours. CBC:  Recent Labs Lab 05/18/15 0817  HGB 14.6  HCT 43.0   Cardiac Enzymes: No results for input(s): CKTOTAL, CKMB, CKMBINDEX, TROPONINI in the last 168 hours.  BNP (last 3 results) No results for input(s): BNP in the last 8760 hours.  ProBNP (last 3 results) No results for input(s): PROBNP in the last 8760 hours.  CBG: No results for input(s): GLUCAP in the last 168 hours.  Radiological Exams on Admission: No results found.  EKG: Independently reviewed. A fib rate of 128 on EKG at 13:30---- NSR on EKG at 4:51PM  Assessment/Plan Active Problems:   Atrial fibrillation, new onset (HCC)   Atrial fibrillation (HCC)   Hypothyroidism   Interstitial cystitis   Acoustic neuroma (HCC)   Atrial fib- new - converted on cardiazem gtt when arrived at hospital -[place] VASC2-- female (1) + old infarct from MRI in 2015 (2)--- 3 -echo -full dose lovenox -cycle CE  Hypothyroid -check TSH -on synthroid  IC -s/p cystoscopy  Acoustic neuroma -s/p resection  Fibroid -takes progesterone per GYN  GERD with esophageal erosions -protonix vs patient's home med  HLD Statin   Code Status: full DVT Prophylaxis: Family Communication: daughter and husband at bedside Disposition Plan:   Time spent: 44 min  [facility] Pager [phone]

## 2015-05-18 NOTE — Progress Notes (Signed)
Patient continues to c/o nausea, and now states she feels dizzy  And like her heart is racing. MD Denenny notified. Per MD patient placed on monitor. HR shows 125, BP 139/84, O2 100% RA. Denenny notified of vitals, order for Labatelol given. Will continue to monitor patient. CLexine Baton

## 2015-05-18 NOTE — Interval H&P Note (Signed)
History and Physical Interval Note:  05/18/2015 8:48 AM  Kristin Bradford  has presented today for surgery, with the diagnosis of INTERSTITIAL CYSTITIS  The various methods of treatment have been discussed with the patient and family. After consideration of risks, benefits and other options for treatment, the patient has consented to  Procedure(s): CYSTOSCOPY/HYDRODISTENSION (N/A) as a surgical intervention .  The patient's history has been reviewed, patient examined, no change in status, stable for surgery.  I have reviewed the patient's chart and labs.  Questions were answered to the patient's satisfaction.     Gar Kristin Bradford

## 2015-05-18 NOTE — Addendum Note (Signed)
Addendum  created 05/18/15 1452 by Franne Grip, MD   Modules edited: Clinical Notes, Orders   Clinical Notes:  File: [id number]; Pend: [id number]; Raelyn Number: [id number]; Pend: [id number]

## 2015-05-18 NOTE — Anesthesia Preprocedure Evaluation (Signed)
Anesthesia Evaluation  Patient identified by MRN, date of birth, ID band Patient awake    Reviewed: Allergy & Precautions, NPO status , Patient's Chart, lab work & pertinent test results  Airway Mallampati: II  TM Distance: >3 FB Neck ROM: Full    Dental no notable dental hx.    Pulmonary neg pulmonary ROS,    Pulmonary exam normal breath sounds clear to auscultation       Cardiovascular negative cardio ROS Normal cardiovascular exam Rhythm:Regular Rate:Normal     Neuro/Psych negative neurological ROS  negative psych ROS   GI/Hepatic negative GI ROS, Neg liver ROS,   Endo/Other  negative endocrine ROS  Renal/GU negative Renal ROS  negative genitourinary   Musculoskeletal negative musculoskeletal ROS (+)   Abdominal   Peds negative pediatric ROS (+)  Hematology negative hematology ROS (+)   Anesthesia Other Findings   Reproductive/Obstetrics negative OB ROS                             Anesthesia Physical Anesthesia Plan  ASA: I  Anesthesia Plan: General   Post-op Pain Management:    Induction: Intravenous  Airway Management Planned: LMA  Additional Equipment:   Intra-op Plan:   Post-operative Plan: Extubation in OR  Informed Consent: I have reviewed the patients History and Physical, chart, labs and discussed the procedure including the risks, benefits and alternatives for the proposed anesthesia with the patient or authorized representative who has indicated his/her understanding and acceptance.   Dental advisory given  Plan Discussed with: CRNA  Anesthesia Plan Comments:         Anesthesia Quick Evaluation  

## 2015-05-19 ENCOUNTER — Inpatient Hospital Stay (HOSPITAL_COMMUNITY): Payer: PRIVATE HEALTH INSURANCE

## 2015-05-19 DIAGNOSIS — I4891 Unspecified atrial fibrillation: Secondary | ICD-10-CM

## 2015-05-19 DIAGNOSIS — I480 Paroxysmal atrial fibrillation: Secondary | ICD-10-CM

## 2015-05-19 LAB — BASIC METABOLIC PANEL
ANION GAP: 9 (ref 5–15)
BUN: 16 mg/dL (ref 6–20)
CHLORIDE: 105 mmol/L (ref 101–111)
CO2: 26 mmol/L (ref 22–32)
Calcium: 9.2 mg/dL (ref 8.9–10.3)
Creatinine, Ser: 0.74 mg/dL (ref 0.44–1.00)
GFR calc Af Amer: >60 mL/min (ref >60)
GFR calc non Af Amer: >60 mL/min (ref >60)
GLUCOSE: 138 mg/dL — AB (ref 65–99)
POTASSIUM: 4.1 mmol/L (ref 3.5–5.1)
Sodium: 140 mmol/L (ref 135–145)

## 2015-05-19 LAB — CBC
HEMATOCRIT: 37.2 % (ref 36.0–46.0)
HEMOGLOBIN: 12.3 g/dL (ref 12.0–15.0)
MCH: 29.2 pg (ref 26.0–34.0)
MCHC: 33.1 g/dL (ref 30.0–36.0)
MCV: 88.4 fL (ref 78.0–100.0)
Platelets: 253 10*3/uL (ref 150–400)
RBC: 4.21 MIL/uL (ref 3.87–5.11)
RDW: 13.3 % (ref 11.5–15.5)
WBC: 12.5 10*3/uL — ABNORMAL HIGH (ref 4.0–10.5)

## 2015-05-19 LAB — TROPONIN I
Troponin I: <0.03 ng/mL (ref <0.031)
Troponin I: <0.03 ng/mL (ref <0.031)

## 2015-05-19 MED ORDER — APIXABAN 5 MG PO TABS
5.0000 mg | ORAL_TABLET | Freq: Two times a day (BID) | ORAL | Status: DC
  Administered 2015-05-19: 5 mg via ORAL
  Filled 2015-05-19: qty 1

## 2015-05-19 MED ORDER — DILTIAZEM HCL ER COATED BEADS 120 MG PO CP24: 120.0000 mg | ORAL_CAPSULE | Freq: Every day | ORAL | Status: DC

## 2015-05-19 MED ORDER — APIXABAN 5 MG PO TABS: 5.0000 mg | ORAL_TABLET | Freq: Two times a day (BID) | ORAL | Status: DC

## 2015-05-19 NOTE — Discharge Instructions (Signed)
CYSTOSCOPY HOME CARE INSTRUCTIONS  Activity: Rest for the remainder of the day.  Do not drive or operate equipment today.  You may resume normal activities in one to two days as instructed by your physician.   Meals: Drink plenty of liquids and eat light foods such as gelatin or soup this evening.  You may return to a normal meal plan tomorrow.  Return to Work: You may return to work in one to two days or as instructed by your physician.  Special Instructions / Symptoms: Call your physician if any of these symptoms occur:   -persistent or heavy bleeding  -bleeding which continues after first few urination  -large blood clots that are difficult to pass  -urine stream diminishes or stops completely  -fever equal to or higher than 101 degrees Farenheit.  -cloudy urine with a strong, foul odor  -severe pain  Females should always wipe from front to back after elimination.  You may feel some burning pain when you urinate.  This should disappear with time.  Applying moist heat to the lower abdomen or a hot tub bath may help relieve the pain. \  Follow-Up / Date of Return Visit to Your Physician:   Call for an appointment to arrange follow-up.  Patient Signature:  ________________________________________________________  Nurse's Signature:  ________________________________________________________  Post Anesthesia Home Care Instructions  Activity: Get plenty of rest for the remainder of the day. A responsible adult should stay with you for 24 hours following the procedure.  For the next 24 hours, DO NOT: -Drive a car -Paediatric nurse -Drink alcoholic beverages -Take any medication unless instructed by your physician -Make any legal decisions or sign important papers.  Meals: Start with liquid foods such as gelatin or soup. Progress to regular foods as tolerated. Avoid greasy, spicy, heavy foods. If nausea and/or vomiting occur, drink only clear liquids until the nausea and/or  vomiting subsides. Call your physician if vomiting continues.  Special Instructions/Symptoms: Your throat may feel dry or sore from the anesthesia or the breathing tube placed in your throat during surgery. If this causes discomfort, gargle with warm salt water. The discomfort should disappear within 24 hours.  If you had a scopolamine patch placed behind your ear for the management of post- operative nausea and/or vomiting:  1. The medication in the patch is effective for 72 hours, after which it should be removed.  Wrap patch in a tissue and discard in the trash. Wash hands thoroughly with soap and water. 2. You may remove the patch earlier than 72 hours if you experience unpleasant side effects which may include dry mouth, dizziness or visual disturbances. 3. Avoid touching the patch. Wash your hands with soap and water after contact with the patch.    Information on my medicine - ELIQUIS (apixaban)  This medication education was reviewed with me or my healthcare representative as part of my discharge preparation.    Why was Eliquis prescribed for you? Eliquis was prescribed for you to reduce the risk of a blood clot forming that can cause a stroke if you have a medical condition called atrial fibrillation (a type of irregular heartbeat).  What do You need to know about Eliquis ? Take your Eliquis TWICE DAILY - one tablet in the morning and one tablet in the evening with or without food. If you have difficulty swallowing the tablet whole please discuss with your pharmacist how to take the medication safely.  Take Eliquis exactly as prescribed by your doctor and DO  NOT stop taking Eliquis without talking to the doctor who prescribed the medication.  Stopping may increase your risk of developing a stroke.  Refill your prescription before you run out.  After discharge, you should have regular check-up appointments with your healthcare provider that is prescribing your Eliquis.  In the  future your dose may need to be changed if your kidney function or weight changes by a significant amount or as you get older.  What do you do if you miss a dose? If you miss a dose, take it as soon as you remember on the same day and resume taking twice daily.  Do not take more than one dose of ELIQUIS at the same time to make up a missed dose.  Important Safety Information A possible side effect of Eliquis is bleeding. You should call your healthcare provider right away if you experience any of the following: ? Bleeding from an injury or your nose that does not stop. ? Unusual colored urine (red or dark brown) or unusual colored stools (red or black). ? Unusual bruising for unknown reasons. ? A serious fall or if you hit your head (even if there is no bleeding).  Some medicines may interact with Eliquis and might increase your risk of bleeding or clotting while on Eliquis. To help avoid this, consult your healthcare provider or pharmacist prior to using any new prescription or non-prescription medications, including herbals, vitamins, non-steroidal anti-inflammatory drugs (NSAIDs) and supplements.  This website has more information on Eliquis (apixaban): http://www.eliquis.com/eliquis/home

## 2015-05-19 NOTE — Progress Notes (Signed)
No urologic events overnight. Tachycardia improving  Filed Vitals:   05/18/15 2036 05/19/15 0400 05/19/15 0746 05/19/15 0800  BP:  109/47  108/70  Pulse:  68  70  Temp: 98.2 F (36.8 C)  98.5 F (36.9 C)   TempSrc: Oral     Resp:  18  17  Height:      Weight:      SpO2:  96%  98%   I/O last 3 completed shifts: In: 1439.1 [I.V.:1439.1] Out: 1800 [Urine:1800]     NAD Soft NT ND No foley  CBC    Component Value Date/Time   WBC 12.5* 05/19/2015 0502   RBC 4.21 05/19/2015 0502   HGB 12.3 05/19/2015 0502   HCT 37.2 05/19/2015 0502   PLT 253 05/19/2015 0502   MCV 88.4 05/19/2015 0502   MCH 29.2 05/19/2015 0502   MCHC 33.1 05/19/2015 0502   RDW 13.3 05/19/2015 0502   LYMPHSABS 0.4* 05/18/2015 1657   MONOABS 0.0* 05/18/2015 1657   EOSABS 0.0 05/18/2015 1657   BASOSABS 0.0 05/18/2015 1657    BMP Latest Ref Rng 05/19/2015 05/18/2015 05/18/2015  Glucose 65 - 99 mg/dL 138(H) 167(H) 100(H)  BUN 6 - 20 mg/dL 16 11 15   Creatinine 0.44 - 1.00 mg/dL 0.74 0.77 0.70  Sodium 135 - 145 mmol/L 140 139 143  Potassium 3.5 - 5.1 mmol/L 4.1 3.7 3.9  Chloride 101 - 111 mmol/L 105 101 104  CO2 22 - 32 mmol/L 26 27 -  Calcium 8.9 - 10.3 mg/dL 9.2 8.8(L) -    POD 1 cysto, hydrodistension with post op afib -doing better -ok for d/c home from urology standpoint when cleared by internal medicine

## 2015-05-19 NOTE — Discharge Summary (Signed)
Physician Discharge Summary  Kristin Bradford [id number] DOB: [birth year] DOA: 05/18/2015  PCP: No primary care provider on file.  Admit date: 05/18/2015 Discharge date: 05/19/2015  Time spent: 35 minutes  Recommendations for Outpatient Follow-up:  1. 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 female  With PMHx of IC, acoustic neuroma. She was undergoing a cystoscopy today. After the procedure, she developed nausea and dizziness. She then described feels of her heart racing. EKG showed atrial fibrillation with rate of 125-150. She was given dose of labetalol. And then started on cardiazem gtt.   Patient denies h/o atrial fib or heart racing in this same manner although, patient has h/o PVCs-- has worn a holter monitor x 2 (In [place]). She also had a normal stress test in early 2000s. 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  Once arriving to the SDU at [facility], she converted around 4:30 back to NSR.    Hospital Course:  A fib- new after urology procedure -[place] VASC2-- female (1) + old infarcts from MRI in 2015 (2)--- 3 -cardiazem 120 CD -eliquis -echo: Left ventricle: The cavity size was normal. Wall thickness was normal. Systolic function was normal. The estimated ejection fraction was in the range of 60% to 65%. Wall motion was normal; there were no regional wall motion abnormalities. Left ventricular diastolic function parameters were normal for the patient&'s age. - Right ventricle: The cavity size was mildly dilated. Wall thickness was normal.  Procedures:  echo  Consultations:  Cards  (phone)  Discharge Exam: Filed Vitals:   05/19/15 0800 05/19/15 1200  BP: 108/70 136/49  Pulse: 70   Temp:  98.3 F (36.8 C)  Resp: 17 12     Discharge Instructions   Discharge Instructions    Diet - low sodium heart healthy    Complete by:  As directed      Discharge instructions    Complete by:  As directed   Follow up with [facility]     Increase activity slowly    Complete by:  As directed           Current Discharge Medication List    START taking these medications   Details  apixaban (ELIQUIS) 5 MG TABS tablet Take 1 tablet (5 mg total) by mouth 2 (two) times daily. Qty: 60 tablet, Refills: 0    diltiazem (CARDIZEM CD) 120 MG 24 hr capsule Take 1 capsule (120 mg total) by mouth daily. Qty: 30 capsule, Refills: 0      CONTINUE these medications which have NOT CHANGED   Details  acetaminophen (TYLENOL) 325 MG tablet Take 650 mg by mouth every 6 (six) hours as needed for moderate pain.     dexlansoprazole (DEXILANT) 60 MG capsule Take 60 mg by mouth daily.    levothyroxine (SYNTHROID, LEVOTHROID) 50 MCG tablet Take 50 mcg by mouth daily before breakfast.    pravastatin (PRAVACHOL) 20 MG tablet Take 20 mg by mouth at bedtime.    progesterone (PROMETRIUM) 100 MG capsule Take 100 mg by mouth at bedtime.       Allergies  Allergen Reactions  .  Betadine [Povidone Iodine] Rash  . Codeine   . Fentanyl Nausea And Vomiting  . Penicillins Itching  . Tetanus Toxoids Nausea And Vomiting and Other (See Comments)    High fever   Follow-up Information    Please follow up.   Why:  PCP 1 week      Follow up with [facility].   Why:  you should hear from them regarding an appointment early next week, if not call: [phone]   Contact information:   [address] [place] [phone]        The results of significant diagnostics from this hospitalization (including imaging, microbiology, ancillary and laboratory) are  listed below for reference.    Significant Diagnostic Studies: No results found.  Microbiology: Recent Results (from the past 240 hour(s))  MRSA PCR Screening     Status: None   Collection Time: 05/18/15  4:26 PM  Result Value Ref Range Status   MRSA by PCR NEGATIVE NEGATIVE Final    Comment:        The GeneXpert MRSA Assay (FDA approved for NASAL specimens only), is one component of a comprehensive MRSA colonization surveillance program. It is not intended to diagnose MRSA infection nor to guide or monitor treatment for MRSA infections.      Labs: Basic Metabolic Panel:  Recent Labs Lab 05/18/15 0817 05/18/15 1724 05/19/15 0502  NA 143 139 140  K 3.9 3.7 4.1  CL 104 101 105  CO2  --  27 26  GLUCOSE 100* 167* 138*  BUN 15 11 16   CREATININE 0.70 0.77 0.74  CALCIUM  --  8.8* 9.2  MG  --  1.8  --    Liver Function Tests:  Recent Labs Lab 05/18/15 1724  AST 34  ALT 19  ALKPHOS 46  BILITOT 0.8  PROT 7.1  ALBUMIN 4.0   No results for input(s): LIPASE, AMYLASE in the last 168 hours. No results for input(s): AMMONIA in the last 168 hours. CBC:  Recent Labs Lab 05/18/15 0817 05/18/15 1657 05/19/15 0502  WBC  --  7.0 12.5*  NEUTROABS  --  6.6  --   HGB 14.6 13.0 12.3  HCT 43.0 39.1 37.2  MCV  --  87.9 88.4  PLT  --  257 253   Cardiac Enzymes:  Recent Labs Lab 05/18/15 1724 05/18/15 2250 05/19/15 0502  TROPONINI <0.03 <0.03 <0.03   BNP: BNP (last 3 results) No results for input(s): BNP in the last 8760 hours.  ProBNP (last 3 results) No results for input(s): PROBNP in the last 8760 hours.  CBG: No results for input(s): GLUCAP in the last 168 hours.     Signed:  Geradine Girt  [facility] 05/19/2015, 2:21 PM

## 2015-05-19 NOTE — Progress Notes (Signed)
Echocardiogram 2D Echocardiogram has been performed.  Tresa Res 05/19/2015, 9:23 AM

## 2015-05-19 NOTE — Progress Notes (Signed)
ANTICOAGULATION CONSULT NOTE - Initial Consult  Pharmacy Consult for apixaban Indication: atrial fibrillation  Allergies  Allergen Reactions  . Betadine [Povidone Iodine] Rash  . Codeine   . Fentanyl Nausea And Vomiting  . Penicillins Itching  . Tetanus Toxoids Nausea And Vomiting and Other (See Comments)    High fever    Patient Measurements: Height: 5\' 11"  (180.3 cm) Weight: 193 lb (87.544 kg) IBW/kg (Calculated) : 70.8 Heparin Dosing Weight:   Vital Signs: Temp: 98.3 F (36.8 C) (12/17 1200) Temp Source: Oral (12/17 1200) BP: 136/49 mmHg (12/17 1200) Pulse Rate: 70 (12/17 0800)  Labs:  Recent Labs  05/18/15 0817 05/18/15 1657 05/18/15 1724 05/18/15 2250 05/19/15 0502  HGB 14.6 13.0  --   --  12.3  HCT 43.0 39.1  --   --  37.2  PLT  --  257  --   --  253  CREATININE 0.70  --  0.77  --  0.74  TROPONINI  --   --  <0.03 <0.03 <0.03    Estimated Creatinine Clearance: 94.9 mL/min (by C-G formula based on Cr of 0.74).   Medical History: Past Medical History  Diagnosis Date  . IC (interstitial cystitis)   . Acquired deafness of left ear     S/P  RESECTION ACOUSTIC NEUROMA  2004  . 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 Acquired facial asymmetry     post surgery    Assessment: 5 yoF with new onset atrial fibrillation.  CHADS-VASc = 3 (female + old infarct from MRI in 2015).  Pharmacy consulted to dose apixaban.  CrCl~94 ml/min.  Hgb, platelets WNL.  Goal of Therapy:  Prevention of stroke and systemic embolism   Plan:  1.  Apixaban 5 mg PO BID. 2.  Pharmacy will provide education prior to discharge.  Hershal Coria 05/19/2015,1:59 PM

## 2015-05-19 NOTE — Care Management Note (Signed)
Case Management Note  Patient Details  Name: Kristin Bradford MRN: [id number] Date of Birth: 1955-11-24  Subjective/Objective:                  new a fib  Action/Plan: CM spoke with patient at the bedside. Patient provided with an Eliquis 30-day free trial card.   Expected Discharge Date:    05/19/15              Expected Discharge Plan:  Home/Self Care  In-House Referral:     Discharge planning Services  CM Consult, Medication Assistance  Post Acute Care Choice:    Choice offered to:  NA  DME Arranged:  N/A DME Agency:  NA  HH Arranged:  NA HH Agency:     Status of Service:  Completed, signed off  Medicare Important Message Given:    Date Medicare IM Given:    Medicare IM give by:    Date Additional Medicare IM Given:    Additional Medicare Important Message give by:     If discussed at [place] of Stay Meetings, dates discussed:    Additional Comments:  Apolonio Schneiders, RN 05/19/2015, 2:40 PM

## 2015-05-19 NOTE — Progress Notes (Signed)
Patient reviewed discharge instructions, and also reviewed Eliquis information.  Patient signed discharge instructions  And pharmacy came to review information on Eliquis.  Husband at bedside.  Patient with no questions and discharged to home with husband.  Prescriptions sent to [facility].  Both NSL's d/c'd intact.  Lidiya Reise Roselie Awkward RN

## 2015-05-20 NOTE — Op Note (Signed)
Pre-operative diagnosis :  Interstitial cystitis  Postoperative diagnosis:  Same  Operation: cystourethroscopy,hydrodistention of bladder (600 cc); insertion of Marcaine, Pyridium solution;injection of Marcaine Kenalog in the subtrigonal space  Surgeon:  S. Gaynelle Arabian, MD  First assistant:  none  Anesthesia: General LMA  Preparation:  After appropriate pre-anesthesia, the patient was brought the operative room,placed on the operating table in the dorsal supine position where general LMA anesthesia was introduced.  She was replaced in the dorsal lithotomy position with pubis was prepped with Betadine solution and draped in the usual fashion.  The history was reviewed.  Review history:   Kristin Bradford is a 59 year-old female with a hx of IC. Previous PUFF score of 16, and now PUFF=15 in 2016. and a complaint " IC flair". She has urinary frequency, difficulty postponing urination, dysuria, and nocturia, as well as weak urinary stream. She has GERD, anxiety, and arrhythmia, history of gestational diabetes, hypothyroidism,.     Note that she is postop acoustic neuroma removal on 2004, with chronic dry mouth. Note that she has particular stressors including recent marital separation, brother with cancer, and mother worsening dementia. In addition she has GERD, fatigue, and headaches.   Urodynamics in the past have shown a maximum bladder capacity of 202 cc. The bladder is hypersensitive, with first sensation at 83 cc. The lead point pressure is non-an abdominal pressure of 105 cm water. The ECG is normal. Fluoroscopy was not accomplished secondary to iodine allergy and seafood allergy.   This patient appears to have a small capacity, hypersensitive bladder. She had pain with catheterization, and a maximum capacity. There is no instability, no stress incontinence, normal EMG.    She has not taken Buspar. Biggest problem is frequency and urgency. She failed Vesicare because of headache.   Past  Medical History Problems  1. History of Anxiety (F41.9) 2. History of Hearing Loss 3. History of cardiac arrhythmia (Z86.79) 4. History of diabetes mellitus (Z86.39) 5. History of esophageal reflux (Z87.19) 6. History of hypercholesterolemia (Z86.39) 7. History of hypothyroidism (Z86.39)  Statement of  Likelihood of Success: Excellent. TIME-OUT observed.:  Procedure:  ystourethroscopy was accomplished.  The external vagina was normal.  The urethra appeared normal.  The bladder base was normal.  Clear reflux was seen from both overseas. The bladder was filled to 600 cc, but would fill no more.  The bladder was refilled to hydrodistended the bladder, bore holes more than 600 cc with hydrodistention under gravity distention.  Photodocumentation was made of multiple areas of bladder mucosal ulcerations.  The bladder was drained of fluid, and a red Robinson catheter was placed.  Pyridium Marcaine solution was inserted in the bladder (20 cc), and Marcaine Kenalog solution was injectedat the bladder base(20 cc).  The patient was then awakened and taken to recovery room in good condition.  She received IV antibiotics.

## 2015-05-21 ENCOUNTER — Inpatient Hospital Stay (HOSPITAL_COMMUNITY)
Admission: EM | Admit: 2015-05-21 | Discharge: 2015-05-24 | DRG: 392 | Disposition: A | Discharge status: Home / Self Care | Payer: PRIVATE HEALTH INSURANCE | Attending: Internal Medicine | Admitting: Internal Medicine

## 2015-05-21 ENCOUNTER — Telehealth: Payer: Self-pay | Admitting: Cardiovascular Disease

## 2015-05-21 ENCOUNTER — Encounter (HOSPITAL_BASED_OUTPATIENT_CLINIC_OR_DEPARTMENT_OTHER): Payer: Self-pay | Admitting: Urology

## 2015-05-21 ENCOUNTER — Emergency Department (HOSPITAL_COMMUNITY): Payer: PRIVATE HEALTH INSURANCE

## 2015-05-21 ENCOUNTER — Observation Stay (HOSPITAL_COMMUNITY): Payer: PRIVATE HEALTH INSURANCE

## 2015-05-21 DIAGNOSIS — E039 Hypothyroidism, unspecified: Secondary | ICD-10-CM | POA: Diagnosis not present

## 2015-05-21 DIAGNOSIS — K529 Noninfective gastroenteritis and colitis, unspecified: Secondary | ICD-10-CM

## 2015-05-21 DIAGNOSIS — K219 Gastro-esophageal reflux disease without esophagitis: Secondary | ICD-10-CM | POA: Diagnosis present

## 2015-05-21 DIAGNOSIS — R55 Syncope and collapse: Secondary | ICD-10-CM | POA: Diagnosis not present

## 2015-05-21 DIAGNOSIS — E860 Dehydration: Secondary | ICD-10-CM | POA: Diagnosis present

## 2015-05-21 DIAGNOSIS — I480 Paroxysmal atrial fibrillation: Secondary | ICD-10-CM

## 2015-05-21 DIAGNOSIS — A084 Viral intestinal infection, unspecified: Secondary | ICD-10-CM | POA: Diagnosis not present

## 2015-05-21 DIAGNOSIS — Z880 Allergy status to penicillin: Secondary | ICD-10-CM

## 2015-05-21 DIAGNOSIS — H9192 Unspecified hearing loss, left ear: Secondary | ICD-10-CM | POA: Diagnosis present

## 2015-05-21 DIAGNOSIS — Z79899 Other long term (current) drug therapy: Secondary | ICD-10-CM

## 2015-05-21 DIAGNOSIS — N3010 Interstitial cystitis (chronic) without hematuria: Secondary | ICD-10-CM | POA: Diagnosis not present

## 2015-05-21 DIAGNOSIS — I4891 Unspecified atrial fibrillation: Secondary | ICD-10-CM | POA: Diagnosis present

## 2015-05-21 DIAGNOSIS — D333 Benign neoplasm of cranial nerves: Secondary | ICD-10-CM | POA: Diagnosis present

## 2015-05-21 DIAGNOSIS — I951 Orthostatic hypotension: Secondary | ICD-10-CM | POA: Diagnosis present

## 2015-05-21 DIAGNOSIS — E785 Hyperlipidemia, unspecified: Secondary | ICD-10-CM | POA: Diagnosis present

## 2015-05-21 DIAGNOSIS — R197 Diarrhea, unspecified: Secondary | ICD-10-CM | POA: Diagnosis present

## 2015-05-21 DIAGNOSIS — R112 Nausea with vomiting, unspecified: Secondary | ICD-10-CM | POA: Diagnosis present

## 2015-05-21 DIAGNOSIS — Z7901 Long term (current) use of anticoagulants: Secondary | ICD-10-CM

## 2015-05-21 DIAGNOSIS — Z887 Allergy status to serum and vaccine status: Secondary | ICD-10-CM

## 2015-05-21 DIAGNOSIS — Z888 Allergy status to other drugs, medicaments and biological substances status: Secondary | ICD-10-CM

## 2015-05-21 DIAGNOSIS — Z885 Allergy status to narcotic agent status: Secondary | ICD-10-CM

## 2015-05-21 HISTORY — DX: Unspecified atrial fibrillation: I48.91

## 2015-05-21 HISTORY — DX: Gestational diabetes mellitus in pregnancy, unspecified control: O24.419

## 2015-05-21 HISTORY — DX: Gastro-esophageal reflux disease without esophagitis: K21.9

## 2015-05-21 HISTORY — DX: Hyperlipidemia, unspecified: E78.5

## 2015-05-21 HISTORY — DX: Syncope and collapse: R55

## 2015-05-21 HISTORY — DX: Hypothyroidism, unspecified: E03.9

## 2015-05-21 HISTORY — DX: Other specified postprocedural states: R11.2

## 2015-05-21 HISTORY — DX: Headache: R51

## 2015-05-21 HISTORY — DX: Migraine, unspecified, not intractable, without status migrainosus: G43.909

## 2015-05-21 HISTORY — DX: Other specified postprocedural states: Z98.890

## 2015-05-21 LAB — BASIC METABOLIC PANEL
Anion gap: 10 (ref 5–15)
BUN: 16 mg/dL (ref 6–20)
CHLORIDE: 108 mmol/L (ref 101–111)
CO2: 21 mmol/L — AB (ref 22–32)
CREATININE: 0.94 mg/dL (ref 0.44–1.00)
Calcium: 9.3 mg/dL (ref 8.9–10.3)
GFR calc non Af Amer: >60 mL/min (ref >60)
Glucose, Bld: 121 mg/dL — ABNORMAL HIGH (ref 65–99)
Potassium: 3.6 mmol/L (ref 3.5–5.1)
Sodium: 139 mmol/L (ref 135–145)

## 2015-05-21 LAB — URINALYSIS, ROUTINE W REFLEX MICROSCOPIC
Bilirubin Urine: NEGATIVE
GLUCOSE, UA: NEGATIVE mg/dL
Ketones, ur: 15 mg/dL — AB
Nitrite: NEGATIVE
PROTEIN: 30 mg/dL — AB
Specific Gravity, Urine: 1.025 (ref 1.005–1.030)
pH: 6 (ref 5.0–8.0)

## 2015-05-21 LAB — CBC
HCT: 43.5 % (ref 36.0–46.0)
Hemoglobin: 14.5 g/dL (ref 12.0–15.0)
MCH: 29.3 pg (ref 26.0–34.0)
MCHC: 33.3 g/dL (ref 30.0–36.0)
MCV: 87.9 fL (ref 78.0–100.0)
PLATELETS: 261 10*3/uL (ref 150–400)
RBC: 4.95 MIL/uL (ref 3.87–5.11)
RDW: 13.6 % (ref 11.5–15.5)
WBC: 9.3 10*3/uL (ref 4.0–10.5)

## 2015-05-21 LAB — HEPATIC FUNCTION PANEL
ALBUMIN: 4 g/dL (ref 3.5–5.0)
ALK PHOS: 47 U/L (ref 38–126)
ALT: 20 U/L (ref 14–54)
AST: 33 U/L (ref 15–41)
Bilirubin, Direct: 0.1 mg/dL (ref 0.1–0.5)
Indirect Bilirubin: 0.7 mg/dL (ref 0.3–0.9)
TOTAL PROTEIN: 6.9 g/dL (ref 6.5–8.1)
Total Bilirubin: 0.8 mg/dL (ref 0.3–1.2)

## 2015-05-21 LAB — TSH: TSH: 0.355 u[IU]/mL (ref 0.350–4.500)

## 2015-05-21 LAB — LIPASE, BLOOD: Lipase: 30 U/L (ref 11–51)

## 2015-05-21 LAB — URINE MICROSCOPIC-ADD ON

## 2015-05-21 LAB — I-STAT TROPONIN, ED: Troponin i, poc: 0.01 ng/mL (ref 0.00–0.08)

## 2015-05-21 LAB — CBG MONITORING, ED: Glucose-Capillary: 78 mg/dL (ref 65–99)

## 2015-05-21 MED ORDER — SODIUM CHLORIDE 0.9 % IV SOLN
INTRAVENOUS | Status: DC
  Administered 2015-05-22 – 2015-05-23 (×2): via INTRAVENOUS

## 2015-05-21 MED ORDER — SODIUM CHLORIDE 0.9 % IJ SOLN
3.0000 mL | Freq: Two times a day (BID) | INTRAMUSCULAR | Status: DC
  Administered 2015-05-22 – 2015-05-23 (×3): 3 mL via INTRAVENOUS

## 2015-05-21 MED ORDER — LEVOTHYROXINE SODIUM 50 MCG PO TABS
50.0000 ug | ORAL_TABLET | Freq: Every day | ORAL | Status: DC
  Administered 2015-05-22 – 2015-05-24 (×3): 50 ug via ORAL
  Filled 2015-05-21 (×3): qty 1

## 2015-05-21 MED ORDER — ONDANSETRON HCL 4 MG/2ML IJ SOLN: 4.0000 mg | Freq: Four times a day (QID) | INTRAMUSCULAR | Status: DC | PRN

## 2015-05-21 MED ORDER — ACETAMINOPHEN 325 MG PO TABS
650.0000 mg | ORAL_TABLET | Freq: Four times a day (QID) | ORAL | Status: DC | PRN
Start: 2015-05-21 — End: 2015-05-24
  Administered 2015-05-22: 650 mg via ORAL
  Filled 2015-05-21: qty 2

## 2015-05-21 MED ORDER — PRAVASTATIN SODIUM 20 MG PO TABS
20.0000 mg | ORAL_TABLET | Freq: Every day | ORAL | Status: DC
  Administered 2015-05-21 – 2015-05-23 (×3): 20 mg via ORAL
  Filled 2015-05-21 (×3): qty 1

## 2015-05-21 MED ORDER — SODIUM CHLORIDE 0.9 % IV BOLUS (SEPSIS)
1000.0000 mL | Freq: Once | INTRAVENOUS | Status: AC
  Administered 2015-05-21: 1000 mL via INTRAVENOUS

## 2015-05-21 MED ORDER — APIXABAN 5 MG PO TABS
5.0000 mg | ORAL_TABLET | Freq: Two times a day (BID) | ORAL | Status: DC
  Administered 2015-05-21 – 2015-05-24 (×6): 5 mg via ORAL
  Filled 2015-05-21 (×6): qty 1

## 2015-05-21 MED ORDER — PROGESTERONE MICRONIZED 100 MG PO CAPS
100.0000 mg | ORAL_CAPSULE | Freq: Every day | ORAL | Status: DC
  Administered 2015-05-21 – 2015-05-23 (×3): 100 mg via ORAL
  Filled 2015-05-21 (×4): qty 1

## 2015-05-21 MED ORDER — DILTIAZEM HCL 25 MG/5ML IV SOLN
5.0000 mg | Freq: Three times a day (TID) | INTRAVENOUS | Status: DC | PRN
  Filled 2015-05-21: qty 5

## 2015-05-21 MED ORDER — SODIUM CHLORIDE 0.9 % IV SOLN: Freq: Once | INTRAVENOUS | Status: DC

## 2015-05-21 MED ORDER — ONDANSETRON HCL 4 MG PO TABS: 4.0000 mg | ORAL_TABLET | Freq: Four times a day (QID) | ORAL | Status: DC | PRN

## 2015-05-21 MED ORDER — ALUM & MAG HYDROXIDE-SIMETH 200-200-20 MG/5ML PO SUSP: 30.0000 mL | Freq: Four times a day (QID) | ORAL | Status: DC | PRN

## 2015-05-21 MED ORDER — DILTIAZEM HCL ER COATED BEADS 120 MG PO CP24
120.0000 mg | ORAL_CAPSULE | Freq: Every day | ORAL | Status: DC
  Administered 2015-05-21: 120 mg via ORAL
  Filled 2015-05-21: qty 1

## 2015-05-21 MED ORDER — ACETAMINOPHEN 650 MG RE SUPP: 650.0000 mg | Freq: Four times a day (QID) | RECTAL | Status: DC | PRN

## 2015-05-21 NOTE — Telephone Encounter (Signed)
He says he needs to talk to you again.

## 2015-05-21 NOTE — ED Notes (Signed)
Pt "passed out" at least 7 times during the night time. With violent vomiting . And diarrhea. per husband

## 2015-05-21 NOTE — ED Notes (Signed)
Pt has had a cystoscopy on Friday. Went into a fib, got admitted to [facility], went home Saturday, was on cardizem. Sent home on elaquis and cardizem. Pt states has had vomiting all night and diarrhea also.

## 2015-05-21 NOTE — ED Provider Notes (Signed)
CSN: [id number]     Arrival date & time 05/21/15  1132 History   First MD Initiated Contact with Patient 05/21/15 1204     Chief Complaint  Patient presents with  . Loss of Consciousness  . Emesis  . Diarrhea     (Consider location/radiation/quality/duration/timing/severity/associated sxs/prior Treatment) HPI  59 year old female presents with multiple syncopal episodes. Had a cystoscopy performed on 12/17. Went into A. fib afterwards. This is a first-time she's never been in A. fib. Was admitted to the hospital and placed on Cardizem and Eliquis. As far as patient no she has been out of A. fib since. Patient then developed nausea, vomiting, diarrhea, and crampy abdominal pain yesterday. Has been having multiple bowel movements as well as multiple doses of emesis. Has passed out around 7 times. One time she passed out 4 times back-to-back to back-to-back. Most of the time it seems to be related to when she is having diarrhea. She does have a history of vasovagal syncope per her. Denies new palpitations. Her chest pain or shortness of breath. One time she thinks she maybe her head when she passed out. Has been having tingling in her fingertips bilaterally. Has cardiology follow up scheduled but has not seen yet.  Past Medical History  Diagnosis Date  . IC (interstitial cystitis)   . Acquired deafness of left ear     S/P  RESECTION ACOUSTIC NEUROMA  2004  . 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 Acquired facial asymmetry     post surgery   Past Surgical History  Procedure Laterality Date  . Craniotomy w/ left acoustic neuroma removal  2004  . Left facial angioma removal /  left mastoidectomy  2005  . Cysto/  hydrodistention/  instillation therapy  x4  last one 05-16-2010    since 1990's  . Colonoscopy  JUNE 1016  . Upper gi endoscopy  JUNE 1016  . Cataract extraction w/ intraocular lens implant  SEPTEMBER 1015  . Cysto with hydrodistension N/A  05/18/2015    Procedure: CYSTOSCOPY/HYDRODISTENSION;  Surgeon: Carolan Clines, MD;  Location: [facility];  Service: Urology;  Laterality: N/A;   Family History  Problem Relation Age of Onset  . Adopted: Yes   Social History  Substance Use Topics  . Smoking status: Never Smoker   . Smokeless tobacco: Never Used  . Alcohol Use: Yes     Comment: rarely   OB History    Gravida Para Term Preterm AB TAB SAB Ectopic Multiple Living   3 2 2  1  1   2      Review of Systems  Constitutional: Negative for fever.  Respiratory: Negative for shortness of breath.   Cardiovascular: Negative for chest pain.  Gastrointestinal: Positive for nausea, vomiting, abdominal pain and diarrhea.  All other systems reviewed and are negative.     Allergies  Betadine; Codeine; Fentanyl; Penicillins; and Tetanus toxoids  Home Medications   Prior to Admission medications   Medication Sig Start Date End Date Taking? Authorizing Provider  apixaban (ELIQUIS) 5 MG TABS tablet Take 1 tablet (5 mg total) by mouth 2 (two) times daily. 05/19/15  Yes Geradine Girt, DO  dexlansoprazole (DEXILANT) 60 MG capsule Take 60 mg by mouth daily.   Yes Historical Provider, MD  diltiazem (CARDIZEM CD) 120 MG 24 hr capsule Take 1 capsule (120 mg total) by mouth daily. 05/19/15  Yes Geradine Girt, DO  levothyroxine (SYNTHROID, LEVOTHROID)  50 MCG tablet Take 50 mcg by mouth daily before breakfast.   Yes Historical Provider, MD  polyethylene glycol powder (GLYCOLAX/MIRALAX) powder Take 0.25 Containers by mouth once.   Yes Historical Provider, MD  pravastatin (PRAVACHOL) 20 MG tablet Take 20 mg by mouth at bedtime.   Yes Historical Provider, MD  progesterone (PROMETRIUM) 100 MG capsule Take 100 mg by mouth at bedtime.   Yes Historical Provider, MD   BP 115/72 mmHg  Pulse 105  Temp(Src) 98.5 F (36.9 C) (Oral)  Resp 18  Ht 5\' 11"  (1.803 m)  Wt 190 lb (86.183 kg)  BMI 26.51 kg/m2  SpO2 100% Physical  Exam  Constitutional: She is oriented to person, place, and time. She appears well-developed and well-nourished.  HENT:  Head: Normocephalic and atraumatic.  Right Ear: External ear normal.  Left Ear: External ear normal.  Nose: Nose normal.  Mouth/Throat: Mucous membranes are dry.  Eyes: Right eye exhibits no discharge. Left eye exhibits no discharge.  Cardiovascular: Normal rate, regular rhythm and normal heart sounds.   Pulmonary/Chest: Effort normal and breath sounds normal.  Abdominal: Soft. She exhibits no distension. There is no tenderness.  Neurological: She is alert and oriented to person, place, and time.  Skin: Skin is warm and dry.  Nursing note and vitals reviewed.   ED Course  Procedures (including critical care time) Labs Review Labs Reviewed  BASIC METABOLIC PANEL - Abnormal; Notable for the following:    CO2 21 (*)    Glucose, Bld 121 (*)    All other components within normal limits  URINALYSIS, ROUTINE W REFLEX MICROSCOPIC (NOT AT [facility]) - Abnormal; Notable for the following:    Color, Urine AMBER (*)    APPearance HAZY (*)    Hgb urine dipstick SMALL (*)    Ketones, ur 15 (*)    Protein, ur 30 (*)    Leukocytes, UA SMALL (*)    All other components within normal limits  URINE MICROSCOPIC-ADD ON - Abnormal; Notable for the following:    Squamous Epithelial / LPF 0-5 (*)    Bacteria, UA RARE (*)    All other components within normal limits  CBC  HEPATIC FUNCTION PANEL  LIPASE, BLOOD  CBG MONITORING, ED  I-STAT TROPOININ, ED    Imaging Review Ct Head Wo Contrast  05/21/2015  CLINICAL DATA:  Nausea and vomiting since last night. Syncopal episode with fall last night. Headache today. EXAM: CT HEAD WITHOUT CONTRAST TECHNIQUE: Contiguous axial images were obtained from the base of the skull through the vertex without intravenous contrast. COMPARISON:  MRI brain 05/10/2014 FINDINGS: Examination demonstrates evidence of previous left mastoidectomy for resection  of vestibular schwannoma 2004 and left facial angioma resection 2005. Focal hardware along the superior aspect of the cranial defect causing streak artifact. The ventricles, cisterns and other CSF spaces are within normal. There is no mass, mass effect, shift of midline structures or acute hemorrhage. There is no evidence of acute infarction. 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 Remainder of the exam is unchanged. IMPRESSION: No acute intracranial findings. 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 Postsurgical change compatible with previous left mastoidectomy. Electronically Signed   By: Marin Olp M.D.   On: 05/21/2015 12:29   I have personally reviewed and evaluated these images and lab results as part of my medical decision-making.   EKG Interpretation   Date/Time:  Monday May 21 2015 11:42:05 EST Ventricular Rate:  108 PR Interval:  116 QRS  Duration: 74 QT Interval:  318 QTC Calculation: 426 R Axis:   79 Text Interpretation:  Sinus tachycardia with occasional Premature  ventricular complexes ST \\T \ T wave abnormality, consider inferior  ischemia Abnormal ECG ST/T changes new compared to May 18 2015 Confirmed  by Regenia Skeeter  MD, [place] [id number]) on 05/21/2015 11:50:46 AM       EKG Interpretation  Date/Time:  Monday May 21 2015 14:25:40 EST Ventricular Rate:  67 PR Interval:  135 QRS Duration: 100 QT Interval:  398 QTC Calculation: 420 R Axis:   63 Text Interpretation:  Sinus rhythm ST/T changes resolved Confirmed by Adolph Clutter  MD, Karrisa Didio ([id number]) on 05/21/2015 3:14:48 PM       MDM   Final diagnoses:  Syncope, unspecified syncope type    Patient appears dehydrated here which is likely contribute into her lightheadedness and syncope. Her abdominal exam is benign, this is likely a viral gastroenteritis. I believe that now that she is over her A. fib, having Cardizem and doing vagal maneuvers for going to the bathroom likely has caused her to  become bradycardic and passed out. Given this happened multiple times I consulted cardiology, Dr. Radford Pax recommends admission for 23 hour observation to internal medicine for fluids and cardiac monitoring. Discussed with the hospitalist, they agree for observation and further workup.    Sherwood Gambler, MD 05/21/15 [phone]

## 2015-05-21 NOTE — ED Notes (Signed)
Pt has a hx of Interstitial Cystitis.  Right arm BP less than left arm BP on Friday while at [facility].

## 2015-05-21 NOTE — H&P (Signed)
[facility] History and Physical  Kristin Bradford [id number] DOB: August 28, 1955 DOA: 05/21/2015  Referring physician: Regenia Skeeter PCP: No primary care provider on file. shaw  Chief Complaint: syncope/emesis/diarrhea  HPI: Kristin Bradford is a 59 y.o. female with a past medical history that includes interstitial cystitis for 30 years status post recent cystoscopy, benign brain tumor status post craniotomy, A. fib recently diagnosed after cystoscopy presents to the emergency department with chief complaint of syncope persistent nausea vomiting diarrhea. Initial evaluation in the emergency department reveals some dehydration with persistent nausea.  Information is obtained from the patient and her husband at bedside. She underwent a cystoscopy on 1217 postoperatively went into A. fib. She was started on our question Cardizem and admitted. She converted and was discharged on this medications. Thereafter she developed nausea vomiting and diarrhea with intermittent abdominal pain. Last night she had multiple episodes of diarrhea and vomiting. She denies coffee ground emesis. Reports vomiting undigested food and then dry heaving the rest of the night. Husband reports her "passing out" while on the commode. Patient has a history 2007 of syncope with emesis and 2 years prior to that. She states she does not have a syncopal episode every time she vomits it usually after severe episodes of dry heaving. Associated symptoms include tingling of her fingertips.  In the emergency department she is afebrile hemodynamically stable and not hypoxic  Review of Systems:   10 point review of systems complete and all systems are negative except as indicated in the history of present illness  Past Medical History  Diagnosis Date  . IC (interstitial cystitis)   . Acquired deafness of left ear     S/P  RESECTION ACOUSTIC NEUROMA  2004  . 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  Acquired facial asymmetry     post surgery  . Syncope    Past Surgical History  Procedure Laterality Date  . Craniotomy w/ left acoustic neuroma removal  2004  . Left facial angioma removal /  left mastoidectomy  2005  . Cysto/  hydrodistention/  instillation therapy  x4  last one 05-16-2010    since 1990's  . Colonoscopy  JUNE 1016  . Upper gi endoscopy  JUNE 1016  . Cataract extraction w/ intraocular lens implant  SEPTEMBER 1015  . Cysto with hydrodistension N/A 05/18/2015    Procedure: CYSTOSCOPY/HYDRODISTENSION;  Surgeon: Carolan Clines, MD;  Location: [facility];  Service: Urology;  Laterality: N/A;   Social History:  reports that she has never smoked. She has never used smokeless tobacco. She reports that she drinks alcohol. She reports that she does not use illicit drugs. Was at home with her husband is independent with ADLs Allergies  Allergen Reactions  . Betadine [Povidone Iodine] Rash  . Codeine   . Fentanyl Nausea And Vomiting  . Penicillins Itching    Has patient had a PCN reaction causing immediate rash, facial/tongue/throat swelling, SOB or lightheadedness with hypotension: Yes Has patient had a PCN reaction causing severe rash involving mucus membranes or skin necrosis: No Has patient had a PCN reaction that required hospitalization No Has patient had a PCN reaction occurring within the last 10 years: No If all of the above answers are "NO", then may proceed with Cephalosporin use.   . Tetanus Toxoids Nausea And Vomiting and Other (See Comments)    High fever    Family History  Problem Relation Age of Onset  .  Adopted: Yes    Prior to Admission medications   Medication Sig Start Date End Date Taking? Authorizing Provider  apixaban (ELIQUIS) 5 MG TABS tablet Take 1 tablet (5 mg total) by mouth 2 (two) times daily. 05/19/15  Yes Geradine Girt, DO  dexlansoprazole (DEXILANT) 60 MG capsule Take 60 mg by mouth daily.   Yes Historical Provider,  MD  diltiazem (CARDIZEM CD) 120 MG 24 hr capsule Take 1 capsule (120 mg total) by mouth daily. 05/19/15  Yes Geradine Girt, DO  levothyroxine (SYNTHROID, LEVOTHROID) 50 MCG tablet Take 50 mcg by mouth daily before breakfast.   Yes Historical Provider, MD  polyethylene glycol powder (GLYCOLAX/MIRALAX) powder Take 0.25 Containers by mouth once.   Yes Historical Provider, MD  pravastatin (PRAVACHOL) 20 MG tablet Take 20 mg by mouth at bedtime.   Yes Historical Provider, MD  progesterone (PROMETRIUM) 100 MG capsule Take 100 mg by mouth at bedtime.   Yes Historical Provider, MD   Physical Exam: Filed Vitals:   05/21/15 1415 05/21/15 1418 05/21/15 1420 05/21/15 1500  BP: 113/70 116/70 115/60 111/60  Pulse: 68 69  64  Temp:      TempSrc:      Resp: 15 18  16   Height:      Weight:      SpO2: 100% 100%  99%    Wt Readings from Last 3 Encounters:  05/21/15 86.183 kg (190 lb)  05/18/15 87.544 kg (193 lb)  05/10/14 83.915 kg (185 lb)    General:  Appears calm and comfortable slightly ill Eyes: PERRL, normal lids, irises & conjunctiva ENT: grossly normal hearing, does membranes of her mouth are pink slightly dry Neck: no LAD, masses or thyromegaly Cardiovascular: RRR, no m/r/g. No LE edema. Telemetry: SR, no arrhythmias  Respiratory: CTA bilaterally, no w/r/r. Normal respiratory effort. Abdomen: soft, ntnd positive bowel sounds nondistended Skin: no rash or induration seen on limited exam Musculoskeletal: grossly normal tone BUE/BLE Psychiatric: grossly normal mood and affect, speech fluent and appropriate Neurologic: grossly non-focal.          Labs on Admission:  Basic Metabolic Panel:  Recent Labs Lab 05/18/15 0817 05/18/15 1724 05/19/15 0502 05/21/15 1211  NA 143 139 140 139  K 3.9 3.7 4.1 3.6  CL 104 101 105 108  CO2  --  27 26 21*  GLUCOSE 100* 167* 138* 121*  BUN 15 11 16 16   CREATININE 0.70 0.77 0.74 0.94  CALCIUM  --  8.8* 9.2 9.3  MG  --  1.8  --   --     Liver Function Tests:  Recent Labs Lab 05/18/15 1724 05/21/15 1201  AST 34 33  ALT 19 20  ALKPHOS 46 47  BILITOT 0.8 0.8  PROT 7.1 6.9  ALBUMIN 4.0 4.0    Recent Labs Lab 05/21/15 1201  LIPASE 30   No results for input(s): AMMONIA in the last 168 hours. CBC:  Recent Labs Lab 05/18/15 0817 05/18/15 1657 05/19/15 0502 05/21/15 1211  WBC  --  7.0 12.5* 9.3  NEUTROABS  --  6.6  --   --   HGB 14.6 13.0 12.3 14.5  HCT 43.0 39.1 37.2 43.5  MCV  --  87.9 88.4 87.9  PLT  --  257 253 261   Cardiac Enzymes:  Recent Labs Lab 05/18/15 1724 05/18/15 2250 05/19/15 0502  TROPONINI <0.03 <0.03 <0.03    BNP (last 3 results) No results for input(s): BNP in the last 8760 hours.  ProBNP (last 3 results) No results for input(s): PROBNP in the last 8760 hours.  CBG:  Recent Labs Lab 05/21/15 1510  GLUCAP 78    Radiological Exams on Admission: Ct Head Wo Contrast  05/21/2015  CLINICAL DATA:  Nausea and vomiting since last night. Syncopal episode with fall last night. Headache today. EXAM: CT HEAD WITHOUT CONTRAST TECHNIQUE: Contiguous axial images were obtained from the base of the skull through the vertex without intravenous contrast. 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 Focal hardware along the superior aspect of the cranial defect causing streak artifact. The ventricles, cisterns and other CSF spaces are within normal. There is no mass, mass effect, shift of midline structures or acute hemorrhage. There is no evidence of acute infarction. 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 Remainder of the exam is unchanged. IMPRESSION: No acute intracranial findings. 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 Postsurgical change compatible with previous left mastoidectomy. Electronically Signed   By:  Marin Olp M.D.   On: 05/21/2015 12:29    EKG: Independently reviewed. Sinus rhythm  Assessment/Plan Principal Problem:   Syncope Active Problems:   Atrial fibrillation (HCC)   Hypothyroidism   Interstitial cystitis   Acoustic neuroma (HCC)   Nausea and vomiting   Diarrhea   IC (interstitial cystitis)  #1. Syncope. History of syncope with vasovagal during emesis. Suspect this is same secondary to a viral gastroenteritis -Admit to telemetry -follow syncope protocol -Vigorous IV fluids -Clear liquids to be advanced as tolerated -TSH -Prostatic vital signs  #2. A. fib. New diagnosis as of 2 days ago. Question as to anesthesia related as it was postop from cystoscopy. Currently in sinus rhythm. Patient feels like nausea and vomiting are reaction to our questioning Cardizem -Started on request and Cardizem -We'll hold this for now. -Per ED physician recommendation from Dr. Radford Pax was admission for observation -Follow-up with cardiology outpatient per Dr. Radford Pax  #3. Nausea/vomiting/diarrhea. Likely viral gastroenteritis -Stool for studies -Clear liquids as tolerated -Advanced diet as tolerated -Anti-emetic -IV fluids as noted above  #4. Interstitial cystitis. Cystoscopy on December 17. -Appears stable at baseline   Code Status: full DVT Prophylaxis: Family Communication: husband at bedside Disposition Plan: home hopefully 24 hours  Time spent: 70 minutes  [facility]

## 2015-05-21 NOTE — Progress Notes (Signed)
Patients heart rhythm in and out of a-fib.  Pt. Had a few runs of a-fib with HR into the 140s.  Patient can feel her heart racing when this happens.  Vitals otherwise stable.  Dr. Marily Memos made aware.  See new orders.  Will continue to monitor.

## 2015-05-21 NOTE — Telephone Encounter (Signed)
Received a call from patient's husband.Stated he would like wife seen sooner than 06/06/15.Stated he was up with wife all of last night.Stated she had vomiting and diarrhea 7 times last night.Stated every time she had diarrhea she passed put.Stated she passed out 7 times last night.No fast heart beat.She continues to have palpitations.Stated she is weak this morning.Advised she needs to go to ER.Advised I will send message to [facility].

## 2015-05-21 NOTE — ED Notes (Signed)
Pt reports she had a procedure done on Friday at [place] and then was found to be in a-fib. Was admitting to the [facility] there and then dc'ed home with medication. Reports she was feeling well until last night and had a couple of episodes of diarrhea/vomiting and has passed out about 7 times.

## 2015-05-22 ENCOUNTER — Encounter (HOSPITAL_COMMUNITY): Payer: Self-pay | Admitting: Anesthesiology

## 2015-05-22 ENCOUNTER — Encounter (HOSPITAL_COMMUNITY): Payer: Self-pay | Admitting: General Practice

## 2015-05-22 DIAGNOSIS — R55 Syncope and collapse: Secondary | ICD-10-CM | POA: Diagnosis not present

## 2015-05-22 DIAGNOSIS — E038 Other specified hypothyroidism: Secondary | ICD-10-CM | POA: Diagnosis not present

## 2015-05-22 DIAGNOSIS — Z887 Allergy status to serum and vaccine status: Secondary | ICD-10-CM | POA: Diagnosis not present

## 2015-05-22 DIAGNOSIS — E039 Hypothyroidism, unspecified: Secondary | ICD-10-CM | POA: Diagnosis present

## 2015-05-22 DIAGNOSIS — Z79899 Other long term (current) drug therapy: Secondary | ICD-10-CM | POA: Diagnosis not present

## 2015-05-22 DIAGNOSIS — Z888 Allergy status to other drugs, medicaments and biological substances status: Secondary | ICD-10-CM | POA: Diagnosis not present

## 2015-05-22 DIAGNOSIS — I480 Paroxysmal atrial fibrillation: Secondary | ICD-10-CM | POA: Diagnosis present

## 2015-05-22 DIAGNOSIS — I951 Orthostatic hypotension: Secondary | ICD-10-CM | POA: Diagnosis present

## 2015-05-22 DIAGNOSIS — Z885 Allergy status to narcotic agent status: Secondary | ICD-10-CM | POA: Diagnosis not present

## 2015-05-22 DIAGNOSIS — H9192 Unspecified hearing loss, left ear: Secondary | ICD-10-CM | POA: Diagnosis present

## 2015-05-22 DIAGNOSIS — E860 Dehydration: Secondary | ICD-10-CM | POA: Diagnosis present

## 2015-05-22 DIAGNOSIS — Z880 Allergy status to penicillin: Secondary | ICD-10-CM | POA: Diagnosis not present

## 2015-05-22 DIAGNOSIS — K529 Noninfective gastroenteritis and colitis, unspecified: Secondary | ICD-10-CM | POA: Diagnosis not present

## 2015-05-22 DIAGNOSIS — K219 Gastro-esophageal reflux disease without esophagitis: Secondary | ICD-10-CM | POA: Diagnosis present

## 2015-05-22 DIAGNOSIS — E785 Hyperlipidemia, unspecified: Secondary | ICD-10-CM | POA: Diagnosis present

## 2015-05-22 DIAGNOSIS — Z7901 Long term (current) use of anticoagulants: Secondary | ICD-10-CM | POA: Diagnosis not present

## 2015-05-22 DIAGNOSIS — A084 Viral intestinal infection, unspecified: Secondary | ICD-10-CM | POA: Diagnosis present

## 2015-05-22 DIAGNOSIS — N3010 Interstitial cystitis (chronic) without hematuria: Secondary | ICD-10-CM | POA: Diagnosis present

## 2015-05-22 DIAGNOSIS — D333 Benign neoplasm of cranial nerves: Secondary | ICD-10-CM | POA: Diagnosis not present

## 2015-05-22 LAB — URINE MICROSCOPIC-ADD ON

## 2015-05-22 LAB — CBC
HCT: 35 % — ABNORMAL LOW (ref 36.0–46.0)
HEMOGLOBIN: 11.4 g/dL — AB (ref 12.0–15.0)
MCH: 28.7 pg (ref 26.0–34.0)
MCHC: 32.6 g/dL (ref 30.0–36.0)
MCV: 88.2 fL (ref 78.0–100.0)
PLATELETS: 193 10*3/uL (ref 150–400)
RBC: 3.97 MIL/uL (ref 3.87–5.11)
RDW: 13.4 % (ref 11.5–15.5)
WBC: 5.9 10*3/uL (ref 4.0–10.5)

## 2015-05-22 LAB — BASIC METABOLIC PANEL
ANION GAP: 6 (ref 5–15)
BUN: 6 mg/dL (ref 6–20)
CALCIUM: 8.6 mg/dL — AB (ref 8.9–10.3)
CO2: 24 mmol/L (ref 22–32)
CREATININE: 0.84 mg/dL (ref 0.44–1.00)
Chloride: 109 mmol/L (ref 101–111)
Glucose, Bld: 111 mg/dL — ABNORMAL HIGH (ref 65–99)
Potassium: 3.6 mmol/L (ref 3.5–5.1)
SODIUM: 139 mmol/L (ref 135–145)

## 2015-05-22 LAB — URINALYSIS, ROUTINE W REFLEX MICROSCOPIC
Bilirubin Urine: NEGATIVE
Glucose, UA: NEGATIVE mg/dL
Hgb urine dipstick: NEGATIVE
KETONES UR: NEGATIVE mg/dL
NITRITE: NEGATIVE
PROTEIN: NEGATIVE mg/dL
Specific Gravity, Urine: 1.007 (ref 1.005–1.030)
pH: 6.5 (ref 5.0–8.0)

## 2015-05-22 LAB — LACTIC ACID, PLASMA
LACTIC ACID, VENOUS: 0.7 mmol/L (ref 0.5–2.0)
Lactic Acid, Venous: 1 mmol/L (ref 0.5–2.0)

## 2015-05-22 LAB — GLUCOSE, CAPILLARY: GLUCOSE-CAPILLARY: 104 mg/dL — AB (ref 65–99)

## 2015-05-22 NOTE — Telephone Encounter (Signed)
Patient admitted to [facility] hospital on 05/22/15

## 2015-05-22 NOTE — Progress Notes (Signed)
PROGRESS NOTE  Kristin Bradford [id number] DOB: 1955/08/13 DOA: 05/21/2015 PCP: Mayra Neer, MD  Brief History 59 year old female with a history of paroxysmal fibrillation, benign brain tumor, interstitial cystitis presented with syncope in the setting of intractable vomiting and diarrhea. The patient was recently discharged from the hospital after developing atrial fibrillation after cystoscopy on 05/18/2015. The patient spontaneously converted to sinus rhythm during that admission and was discharged home with apixiban and diltiazem. On the evening of 05/20/2015, the patient developed intractable vomiting and diarrhea without any hematemesis, hematochezia, melena.  During the episodes of vomiting and diarrhea, the patient had approximate 5 episodes of syncope lasting seconds in duration. The patient states that she has had a history of vasovagal syncope in the past associated with vomiting. Upon presentation, the patient was noted to have a fever 101.84F. Assessment/Plan: Vasovagal syncope -05/19/2015 echocardiogram EF 60-65%, no major valvular abnormalities -Orthostatics positive -Continue intravenous fluids Orthostatic hypotension/dehydration -Secondary to vomiting and diarrhea -Continue intravenous fluids Vomiting and diarrhea -Likely viral gastroenteritis as the patient has clinically improved after 24 hours of symptoms -Start with clear liquid diet and advance as tolerated Paroxysmal atrial fibrillation -ChadsVASc-3 -Continue apixiban -Hold diltiazem as the patient's blood pressure is soft in the setting of dehydration  -The patient has outpatient cardiology appointment 06/06/2015 -Presently in sinus rhythm  Fever -Likely due to viral syndrome  -Blood cultures 2 sets -Check lactic acid -Urinalysis without any pyuria -Chest x-ray negative for infiltrates   Family Communication:   Pt at beside Disposition Plan:   Home 05/23/2015 or  05/24/2015       Procedures/Studies: X-ray Chest Pa And Lateral  05/21/2015  CLINICAL DATA:  Syncope for 1 day EXAM: CHEST  2 VIEW COMPARISON:  None. FINDINGS: Lungs are clear. Heart size and pulmonary vascularity are normal. No adenopathy. No bone lesions. IMPRESSION: No edema or consolidation. Electronically Signed   By: Lowella Grip III M.D.   On: 05/21/2015 19:48   Ct Head Wo Contrast  05/21/2015  CLINICAL DATA:  Nausea and vomiting since last night. Syncopal episode with fall last night. Headache today. EXAM: CT HEAD WITHOUT CONTRAST TECHNIQUE: Contiguous axial images were obtained from the base of the skull through the vertex without intravenous contrast. 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 Focal hardware along the superior aspect of the cranial defect causing streak artifact. The ventricles, cisterns and other CSF spaces are within normal. There is no mass, mass effect, shift of midline structures or acute hemorrhage. There is no evidence of acute infarction. 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 Remainder of the exam is unchanged. IMPRESSION: No acute intracranial findings. 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 Postsurgical change compatible with previous left mastoidectomy. Electronically Signed   By: Marin Olp M.D.   On: 05/21/2015 12:29        Subjective: Patient denies fevers, chills, headache, chest pain, dyspnea, nausea, vomiting, diarrhea, abdominal pain, dysuria, hematuria   Objective: Filed Vitals:   05/22/15 0247 05/22/15 0427 05/22/15 0430 05/22/15 0435  BP:    86/51  Pulse:  61  67  Temp: 98.5 F (36.9 C) 98.6 F (37 C)    TempSrc: Oral Oral    Resp:  18    Height:      Weight:   86.728 kg (191 lb 3.2 oz)  SpO2:  96%      Intake/Output Summary (Last 24 hours)  at 05/22/15 0828 Last data filed at 05/22/15 0441  Gross per 24 hour  Intake 736.25 ml  Output   1925 ml  Net -1188.75 ml   Weight change:  Exam:   General:  Pt is alert, follows commands appropriately, not in acute distress  HEENT: No icterus, No thrush, No neck mass, [place]/AT  Cardiovascular: RRR, S1/S2, no rubs, no gallops  Respiratory: CTA bilaterally, no wheezing, no crackles, no rhonchi  Abdomen: Soft/+BS, non tender, non distended, no guarding; no hepatosplenomegaly  Extremities: trace LE edema, No lymphangitis, No petechiae, No rashes, no synovitis; no cyanosis or clubbing   Data Reviewed: Basic Metabolic Panel:  Recent Labs Lab 05/18/15 0817 05/18/15 1724 05/19/15 0502 05/21/15 1211 05/22/15 0525  NA 143 139 140 139 139  K 3.9 3.7 4.1 3.6 3.6  CL 104 101 105 108 109  CO2  --  27 26 21* 24  GLUCOSE 100* 167* 138* 121* 111*  BUN 15 11 16 16 6   CREATININE 0.70 0.77 0.74 0.94 0.84  CALCIUM  --  8.8* 9.2 9.3 8.6*  MG  --  1.8  --   --   --    Liver Function Tests:  Recent Labs Lab 05/18/15 1724 05/21/15 1201  AST 34 33  ALT 19 20  ALKPHOS 46 47  BILITOT 0.8 0.8  PROT 7.1 6.9  ALBUMIN 4.0 4.0    Recent Labs Lab 05/21/15 1201  LIPASE 30   No results for input(s): AMMONIA in the last 168 hours. CBC:  Recent Labs Lab 05/18/15 0817 05/18/15 1657 05/19/15 0502 05/21/15 1211 05/22/15 0525  WBC  --  7.0 12.5* 9.3 5.9  NEUTROABS  --  6.6  --   --   --   HGB 14.6 13.0 12.3 14.5 11.4*  HCT 43.0 39.1 37.2 43.5 35.0*  MCV  --  87.9 88.4 87.9 88.2  PLT  --  257 253 261 193   Cardiac Enzymes:  Recent Labs Lab 05/18/15 1724 05/18/15 2250 05/19/15 0502  TROPONINI <0.03 <0.03 <0.03   BNP: Invalid input(s): POCBNP CBG:  Recent Labs Lab 05/21/15 1510 05/22/15 0658  GLUCAP 78 104*    Recent Results (from the past 240 hour(s))  MRSA PCR Screening     Status: None   Collection Time: 05/18/15  4:26 PM  Result Value Ref Range Status    MRSA by PCR NEGATIVE NEGATIVE Final    Comment:        The GeneXpert MRSA Assay (FDA approved for NASAL specimens only), is one component of a comprehensive MRSA colonization surveillance program. It is not intended to diagnose MRSA infection nor to guide or monitor treatment for MRSA infections.      Scheduled Meds: . apixaban  5 mg Oral BID  . levothyroxine  50 mcg Oral QAC breakfast  . pravastatin  20 mg Oral QHS  . progesterone  100 mg Oral QHS  . sodium chloride  3 mL Intravenous Q12H   Continuous Infusions: . sodium chloride 75 mL/hr at 05/21/15 1821     Larsen Dungan, DO  [facility] Pager [phone]  If 7PM-7AM, please contact night-coverage [email or web address] Password [facility] 05/22/2015, 8:28 AM

## 2015-05-22 NOTE — Progress Notes (Signed)
Hospitalist paged x2. Temperature rechecked and has come down to 98.5. Patient is asymptomatic at this time. Will continue to monitor patient to the end of shift.

## 2015-05-22 NOTE — Anesthesia Preprocedure Evaluation (Deleted)
Anesthesia Evaluation  ° ° °Airway ° ° ° ° ° ° ° Dental °  °Pulmonary ° °  ° ° ° ° ° ° ° Cardiovascular ° ° ° °  °Neuro/Psych °  ° GI/Hepatic °  °Endo/Other  °diabetes ° Renal/GU °  ° °  °Musculoskeletal ° ° Abdominal °  °Peds ° Hematology °  °Anesthesia Other Findings ° ° Reproductive/Obstetrics ° °  ° ° ° ° ° ° ° ° ° ° ° ° ° °  °  ° ° ° ° ° ° ° ° °Anesthesia Physical °Anesthesia Plan °Anesthesia Quick Evaluation ° °

## 2015-05-22 NOTE — Progress Notes (Signed)
Notified on-call hospitalist via text page of elevated temperature of 101.6. Awaiting return page and or orders.

## 2015-05-23 ENCOUNTER — Telehealth: Payer: Self-pay | Admitting: Cardiovascular Disease

## 2015-05-23 DIAGNOSIS — D333 Benign neoplasm of cranial nerves: Secondary | ICD-10-CM

## 2015-05-23 LAB — BASIC METABOLIC PANEL
ANION GAP: 7 (ref 5–15)
BUN: 6 mg/dL (ref 6–20)
CHLORIDE: 106 mmol/L (ref 101–111)
CO2: 26 mmol/L (ref 22–32)
CREATININE: 0.81 mg/dL (ref 0.44–1.00)
Calcium: 8.8 mg/dL — ABNORMAL LOW (ref 8.9–10.3)
GFR calc non Af Amer: >60 mL/min (ref >60)
Glucose, Bld: 120 mg/dL — ABNORMAL HIGH (ref 65–99)
POTASSIUM: 3.5 mmol/L (ref 3.5–5.1)
Sodium: 139 mmol/L (ref 135–145)

## 2015-05-23 LAB — CBC
HEMATOCRIT: 34 % — AB (ref 36.0–46.0)
HEMOGLOBIN: 11.5 g/dL — AB (ref 12.0–15.0)
MCH: 29.9 pg (ref 26.0–34.0)
MCHC: 33.8 g/dL (ref 30.0–36.0)
MCV: 88.5 fL (ref 78.0–100.0)
Platelets: 208 10*3/uL (ref 150–400)
RBC: 3.84 MIL/uL — AB (ref 3.87–5.11)
RDW: 13.5 % (ref 11.5–15.5)
WBC: 5.1 10*3/uL (ref 4.0–10.5)

## 2015-05-23 LAB — C DIFFICILE QUICK SCREEN W PCR REFLEX
C DIFFICILE (CDIFF) INTERP: NEGATIVE
C Diff antigen: NEGATIVE
C Diff toxin: NEGATIVE

## 2015-05-23 LAB — GLUCOSE, CAPILLARY: Glucose-Capillary: 107 mg/dL — ABNORMAL HIGH (ref 65–99)

## 2015-05-23 NOTE — Progress Notes (Signed)
PROGRESS NOTE  Kristin Bradford [id number] DOB: January 30, 1956 DOA: 05/21/2015 PCP: Mayra Neer, MD  Brief History 59 year old female with a history of paroxysmal fibrillation, benign brain tumor, interstitial cystitis presented with syncope in the setting of intractable vomiting and diarrhea. The patient was recently discharged from the hospital after developing atrial fibrillation after cystoscopy on 05/18/2015. The patient spontaneously converted to sinus rhythm during that admission and was discharged home with apixiban and diltiazem. On the evening of 05/20/2015, the patient developed intractable vomiting and diarrhea without any hematemesis, hematochezia, melena. During the episodes of vomiting and diarrhea, the patient had approximate 5 episodes of syncope lasting seconds in duration. The patient states that she has had a history of vasovagal syncope in the past associated with vomiting. Upon presentation, the patient was noted to have a fever 101.19F. The patient remained hemodynamically stable and has remained in sinus rhythm.   The patient showed clinical improvement with intravenous fluids, but the patient's husband requested cardiology evaluation. Assessment/Plan: Vasovagal syncope -05/19/2015 echocardiogram EF 60-65%, no major valvular abnormalities -Orthostatics positive -Continue intravenous fluids Orthostatic hypotension/dehydration -Secondary to vomiting and diarrhea -Continue intravenous fluids-->improving Vomiting and diarrhea -Likely viral gastroenteritis as the patient has clinically improved after 24 hours of symptoms -Start with clear liquid diet and advance as tolerated--tolerating diet - C. Difficile negative Paroxysmal atrial fibrillation -ChadsVASc-3 (female and lacunar brain infarcts) -Continue apixiban -Hold diltiazem as the patient's blood pressure is soft in the setting of dehydration  -The patient has outpatient cardiology appointment  06/06/2015 -Presently in sinus rhythm - patient's husband requested cardiology consult while the patient is in the hospital Fever -Likely due to viral syndrome  -Blood cultures 2 sets--neg at 24 hours -Check lactic acid--1.0 -Urinalysis without any pyuria -Chest x-ray negative for infiltrates   Family Communication: Husband updated at beside Disposition Plan: Home 05/24/2015 if stable    Procedures/Studies: X-ray Chest Pa And Lateral  05/21/2015  CLINICAL DATA:  Syncope for 1 day EXAM: CHEST  2 VIEW COMPARISON:  None. FINDINGS: Lungs are clear. Heart size and pulmonary vascularity are normal. No adenopathy. No bone lesions. IMPRESSION: No edema or consolidation. Electronically Signed   By: Lowella Grip III M.D.   On: 05/21/2015 19:48   Ct Head Wo Contrast  05/21/2015  CLINICAL DATA:  Nausea and vomiting since last night. Syncopal episode with fall last night. Headache today. EXAM: CT HEAD WITHOUT CONTRAST TECHNIQUE: Contiguous axial images were obtained from the base of the skull through the vertex without intravenous contrast. 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 Focal hardware along the superior aspect of the cranial defect causing streak artifact. The ventricles, cisterns and other CSF spaces are within normal. There is no mass, mass effect, shift of midline structures or acute hemorrhage. There is no evidence of acute infarction. 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 Remainder of the exam is unchanged. IMPRESSION: No acute intracranial findings. 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 Postsurgical change compatible with previous left mastoidectomy. Electronically Signed   By: Marin Olp M.D.   On: 05/21/2015 12:29        Subjective: Patient denies fevers, chills, headache, chest  pain, dyspnea, nausea, vomiting, diarrhea, abdominal pain, dysuria, hematuria   Objective: Filed Vitals:   05/22/15 1851 05/22/15 2102 05/23/15 0357 05/23/15 1312  BP: 102/64 106/49 104/63 102/42  Pulse:  57 58 51  Temp:  98.2 F (36.8 C) 98.2 F (36.8 C) 98.3 F (36.8 C)  TempSrc:  Oral Oral Oral  Resp:  18 18 20   Height:      Weight:   86.32 kg (190 lb 4.8 oz)   SpO2:  100% 100% 99%    Intake/Output Summary (Last 24 hours) at 05/23/15 1438 Last data filed at 05/23/15 [id number]  Gross per 24 hour  Intake   1308 ml  Output   2200 ml  Net   -892 ml   Weight change: 0.136 kg (4.8 oz) Exam:   General:  Pt is alert, follows commands appropriately, not in acute distress  HEENT: No icterus, No thrush, No neck mass, [place]/AT  Cardiovascular: RRR, S1/S2, no rubs, no gallops  Respiratory: CTA bilaterally, no wheezing, no crackles, no rhonchi  Abdomen: Soft/+BS, non tender, non distended, no guarding; no hepatosplenomegaly  Extremities: No edema, No lymphangitis, No petechiae, No rashes, no synovitis;  No cyanosis or clubbing  Data Reviewed: Basic Metabolic Panel:  Recent Labs Lab 05/18/15 1724 05/19/15 0502 05/21/15 1211 05/22/15 0525 05/23/15 0555  NA 139 140 139 139 139  K 3.7 4.1 3.6 3.6 3.5  CL 101 105 108 109 106  CO2 27 26 21* 24 26  GLUCOSE 167* 138* 121* 111* 120*  BUN 11 16 16 6 6   CREATININE 0.77 0.74 0.94 0.84 0.81  CALCIUM 8.8* 9.2 9.3 8.6* 8.8*  MG 1.8  --   --   --   --    Liver Function Tests:  Recent Labs Lab 05/18/15 1724 05/21/15 1201  AST 34 33  ALT 19 20  ALKPHOS 46 47  BILITOT 0.8 0.8  PROT 7.1 6.9  ALBUMIN 4.0 4.0    Recent Labs Lab 05/21/15 1201  LIPASE 30   No results for input(s): AMMONIA in the last 168 hours. CBC:  Recent Labs Lab 05/18/15 1657 05/19/15 0502 05/21/15 1211 05/22/15 0525 05/23/15 0555  WBC 7.0 12.5* 9.3 5.9 5.1  NEUTROABS 6.6  --   --   --   --   HGB 13.0 12.3 14.5 11.4* 11.5*  HCT 39.1 37.2 43.5  35.0* 34.0*  MCV 87.9 88.4 87.9 88.2 88.5  PLT 257 253 261 193 208   Cardiac Enzymes:  Recent Labs Lab 05/18/15 1724 05/18/15 2250 05/19/15 0502  TROPONINI <0.03 <0.03 <0.03   BNP: Invalid input(s): POCBNP CBG:  Recent Labs Lab 05/21/15 1510 05/22/15 0658 05/23/15 0457  GLUCAP 78 104* 107*    Recent Results (from the past 240 hour(s))  MRSA PCR Screening     Status: None   Collection Time: 05/18/15  4:26 PM  Result Value Ref Range Status   MRSA by PCR NEGATIVE NEGATIVE Final    Comment:        The GeneXpert MRSA Assay (FDA approved for NASAL specimens only), is one component of a comprehensive MRSA colonization surveillance program. It is not intended to diagnose MRSA infection nor to guide or monitor treatment for MRSA infections.   Culture, blood (Routine X 2) w Reflex to ID Panel     Status: None (Preliminary result)   Collection Time: 05/22/15  9:05 AM  Result Value Ref Range Status   Specimen Description BLOOD LEFT ARM  Final   Special Requests IN PEDIATRIC BOTTLE 2CC  Final   Culture NO GROWTH 1 DAY  Final   Report Status PENDING  Incomplete  Culture, blood (Routine X 2) w Reflex to  ID Panel     Status: None (Preliminary result)   Collection Time: 05/22/15  9:10 AM  Result Value Ref Range Status   Specimen Description BLOOD LEFT HAND  Final   Special Requests IN PEDIATRIC BOTTLE 3 CC  Final   Culture NO GROWTH 1 DAY  Final   Report Status PENDING  Incomplete  C difficile quick screen w PCR reflex     Status: None   Collection Time: 05/23/15 10:06 AM  Result Value Ref Range Status   C Diff antigen NEGATIVE NEGATIVE Final   C Diff toxin NEGATIVE NEGATIVE Final   C Diff interpretation Negative for toxigenic C. difficile  Final     Scheduled Meds: . apixaban  5 mg Oral BID  . levothyroxine  50 mcg Oral QAC breakfast  . pravastatin  20 mg Oral QHS  . progesterone  100 mg Oral QHS  . sodium chloride  3 mL Intravenous Q12H   Continuous  Infusions: . sodium chloride 75 mL/hr at 05/23/15 0500     Lashayla Armes, DO  [facility] Pager [phone]  If 7PM-7AM, please contact night-coverage [email or web address] Password [email or web address] 05/23/2015, 2:38 PM   LOS: 1 day

## 2015-05-23 NOTE — Telephone Encounter (Signed)
Returned call to patient's husband he stated wife was admitted to hospital.Stated she has a bacterial infection.Stated since she has been there she has not seen cardiologist.Stated he would like her to see cardiologist while she is there.Advised to let nurse know you want a cardiology consult.

## 2015-05-23 NOTE — Telephone Encounter (Signed)
Please call,he wants to give you an update. He said you helped him on Monday.

## 2015-05-23 NOTE — Consult Note (Signed)
Patient ID: Kristin Bradford MRN: [id number], DOB/AGE: 1956-02-27   Admit date: 05/21/2015   Primary Physician: Mayra Neer, MD Primary Cardiologist: NEW  Reason for Consult: Syncope/ Atrial Fibrillation  Problem List  Past Medical History  Diagnosis Date  . IC (interstitial cystitis)   . Acquired deafness of left ear     S/P  RESECTION ACOUSTIC NEUROMA  2004  . 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 Acquired facial asymmetry     post surgery  . Syncope   . A-fib ([place])   . PONV (postoperative nausea and vomiting)   . Hyperlipidemia   . Hypothyroidism   . Gestational diabetes mellitus   . GERD (gastroesophageal reflux disease)   . Headache     "weekly" (05/22/2015)  . Migraine     "q several months" (05/22/2015)    Past Surgical History  Procedure Laterality Date  . Acoustic neuroma resection Left 2004  . Mastoidectomy Left 2005    LEFT FACIAL ANGIOMA REMOVAL /  LEFT MASTOIDECTOMY [Other]  . Cysto/  hydrodistention/  instillation therapy  x4  last one 05-16-2010    since 1990's  . Colonoscopy  11/2014  . Esophagogastroduodenoscopy  11/2014  . Cataract extraction w/ intraocular lens implant Right 01/2014  . Hemorrhoid banding  05/2015  . Brain surgery    . Hemangioma excision Left 2005     FACIAL ANGIOMA REMOVAL / MASTOIDECTOMY  . Wisdom tooth extraction  1980    "all 4"  . Cysto with hydrodistension N/A 05/18/2015    Procedure: CYSTOSCOPY/HYDRODISTENSION;  Surgeon: Carolan Clines, MD;  Location: [facility];  Service: Urology;  Laterality: N/A;     Allergies  Allergies  Allergen Reactions  . Betadine [Povidone Iodine] Rash  . Codeine   . Fentanyl Nausea And Vomiting  . Penicillins Itching    Has patient had a PCN reaction causing immediate rash, facial/tongue/throat swelling, SOB or lightheadedness with hypotension: Yes Has patient had a PCN reaction causing severe rash involving mucus membranes  or skin necrosis: No Has patient had a PCN reaction that required hospitalization No Has patient had a PCN reaction occurring within the last 10 years: No If all of the above answers are "NO", then may proceed with Cephalosporin use.   . Tetanus Toxoids Nausea And Vomiting and Other (See Comments)    High fever    HPI  59 y/o female with newly diagnosed atrial fibrillation, discovered last week during an outpatient procedure. She was scheduled to undergo a cystoscopy as part of a urology w/u given chronic interstitial cystitis and voiding problems. Procedure was performed by Dr. Gaynelle Arabian 05/18/15. Post-procedure, she developed new onset atrial fibrillation with rates in the 125-150. She was given a dose of labetalol and started on IV cardizem in the ED and converted to NSR. She was admitted by Internal medicine. 2D echo showed normal LVEF of 60-65%. Normal wall motion. TSH was normal. She was placed on PO Cardizem and Eliquis and set up for outpatient cardiac evaluation. A new patient appointment was schedule for her to see Dr. Oval Linsey on 06/06/15. Additional past medical history is notable for benign brain tumor s/p craniotomy. She is adopted thus no unsure of her family h/o. She denies any previous history of HTN. She has h/o gestational diabetes but no issues after pregnancy. She has h/o HLD and takes pravastatin daily. Denies h/o tobacco abuse. She is not aware of any h/o stroke/TIA however MRI  from 2015 shows 2 remote punctate infarcts.  Since her recent discharge from the hospital on 05/19/15, she has been troubled by nausea, vomiting, diarrhea and abdominal pain. During this period of time, she has several episodes of syncope/ near syncope while on the commode. The patient reports that she has a h/o syncope with emesis in the past. The patient thinks that her recent n/v/d is related to Cardizem (lower BP), which was recently started, in addition to possible GI bug.  She is currently in NSR. K is  normal at 3.5. BP is stable. Her GI symptoms have resolved.     Home Medications  Prior to Admission medications   Medication Sig Start Date End Date Taking? Authorizing Provider  apixaban (ELIQUIS) 5 MG TABS tablet Take 1 tablet (5 mg total) by mouth 2 (two) times daily. 05/19/15  Yes Geradine Girt, DO  dexlansoprazole (DEXILANT) 60 MG capsule Take 60 mg by mouth daily.   Yes Historical Provider, MD  diltiazem (CARDIZEM CD) 120 MG 24 hr capsule Take 1 capsule (120 mg total) by mouth daily. 05/19/15  Yes Geradine Girt, DO  levothyroxine (SYNTHROID, LEVOTHROID) 50 MCG tablet Take 50 mcg by mouth daily before breakfast.   Yes Historical Provider, MD  polyethylene glycol powder (GLYCOLAX/MIRALAX) powder Take 0.25 Containers by mouth once.   Yes Historical Provider, MD  pravastatin (PRAVACHOL) 20 MG tablet Take 20 mg by mouth at bedtime.   Yes Historical Provider, MD  progesterone (PROMETRIUM) 100 MG capsule Take 100 mg by mouth at bedtime.   Yes Historical Provider, MD    Family History  Family History  Problem Relation Age of Onset  . Adopted: Yes    Social History  Social History   Social History  . Marital Status: Legally Separated    Spouse Name: N/A  . Number of Children: N/A  . Years of Education: N/A   Occupational History  . Not on file.   Social History Main Topics  . Smoking status: Never Smoker   . Smokeless tobacco: Never Used  . Alcohol Use: 0.6 oz/week    1 Glasses of wine per week  . Drug Use: No  . Sexual Activity: Not Currently    Birth Control/ Protection: None   Other Topics Concern  . Not on file   Social History Narrative     Review of Systems General:  No chills, fever, night sweats or weight changes.  Cardiovascular:  No chest pain, dyspnea on exertion, edema, orthopnea, palpitations, paroxysmal nocturnal dyspnea. Dermatological: No rash, lesions/masses Respiratory: No cough, dyspnea Urologic: No hematuria, dysuria Abdominal:   No  nausea, vomiting, diarrhea, bright red blood per rectum, melena, or hematemesis Neurologic:  No visual changes, wkns, changes in mental status. All other systems reviewed and are otherwise negative except as noted above.  Physical Exam  Blood pressure 102/42, pulse 51, temperature 98.3 F (36.8 C), temperature source Oral, resp. rate 20, height 5\' 11"  (1.803 m), weight 190 lb 4.8 oz (86.32 kg), SpO2 99 %.  General: Pleasant, NAD Psych: Normal affect. Neuro: Alert and oriented X 3. Moves all extremities spontaneously. HEENT: Normal  Neck: Supple without bruits or JVD. Lungs:  Resp regular and unlabored, CTA. Heart: RRR no s3, s4, or murmurs. Abdomen: Soft, non-tender, non-distended, BS + x 4.  Extremities: No clubbing, cyanosis or edema. DP/PT/Radials 2+ and equal bilaterally.  Labs  Troponin [facility] Test)  Recent Labs  05/21/15 1242  TROPIPOC 0.01   No results for input(s):  CKTOTAL, CKMB, TROPONINI in the last 72 hours. Lab Results  Component Value Date   WBC 5.1 05/23/2015   HGB 11.5* 05/23/2015   HCT 34.0* 05/23/2015   MCV 88.5 05/23/2015   PLT 208 05/23/2015    Recent Labs Lab 05/21/15 1201  05/23/15 0555  NA  --   < > 139  K  --   < > 3.5  CL  --   < > 106  CO2  --   < > 26  BUN  --   < > 6  CREATININE  --   < > 0.81  CALCIUM  --   < > 8.8*  PROT 6.9  --   --   BILITOT 0.8  --   --   ALKPHOS 47  --   --   ALT 20  --   --   AST 33  --   --   GLUCOSE  --   < > 120*  < > = values in this interval not displayed. No results found for: CHOL, HDL, LDLCALC, TRIG No results found for: DDIMER   Radiology/Studies  X-ray Chest Pa And Lateral  05/21/2015  CLINICAL DATA:  Syncope for 1 day EXAM: CHEST  2 VIEW COMPARISON:  None. FINDINGS: Lungs are clear. Heart size and pulmonary vascularity are normal. No adenopathy. No bone lesions. IMPRESSION: No edema or consolidation. Electronically Signed   By: Lowella Grip III M.D.   On: 05/21/2015 19:48   Ct  Head Wo Contrast  05/21/2015  CLINICAL DATA:  Nausea and vomiting since last night. Syncopal episode with fall last night. Headache today. EXAM: CT HEAD WITHOUT CONTRAST TECHNIQUE: Contiguous axial images were obtained from the base of the skull through the vertex without intravenous contrast. 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 Focal hardware along the superior aspect of the cranial defect causing streak artifact. The ventricles, cisterns and other CSF spaces are within normal. There is no mass, mass effect, shift of midline structures or acute hemorrhage. There is no evidence of acute infarction. 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 Remainder of the exam is unchanged. IMPRESSION: No acute intracranial findings. 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 Postsurgical change compatible with previous left mastoidectomy. Electronically Signed   By: Marin Olp M.D.   On: 05/21/2015 12:29    ECG  NSR on telemetry.   Echocardiogram 05/19/15  Study Conclusions  - Left ventricle: The cavity size was normal. Wall thickness was normal. Systolic function was normal. The estimated ejection fraction was in the range of 60% to 65%. Wall motion was normal; there were no regional wall motion abnormalities. Left ventricular diastolic function parameters were normal for the patient&'s age. - Right ventricle: The cavity size was mildly dilated. Wall thickness was normal.    ASSESSMENT AND PLAN  Principal Problem:   Syncope Active Problems:   Atrial fibrillation (H[facility])   Hypothyroidism   Interstitial cystitis   Acoustic neuroma ([facility])   Nausea and vomiting   Diarrhea   IC (interstitial cystitis)   Gastroenteritis   1. Syncope: patient has prior h/o syncope related to emesis. Her recent syncopal spell  also occurred in the setting of excessive vomitting/ dry heaving. This is likely vagal mediated syncope.  Her BP has also been on the lower side with use of Cardizem. Her recent events are likely due to  a combination of the two. Recent 2D echo 05/19/15 showed normal LVEF w/o structural abnormalities.   2. Atrial Fibrillation: Patient currently in NSR. HR rate is controlled/ brady in the 67s and at time 49s.  Cardizem is currently on hold due to concerns regarding borderline hypotension. The patient also feels that her recent symptoms are possible secondary to Cardizem. She was on 120 mg daily.  She was started on Eliquis during previous admission. CT imaging studies show old infarcts.  She has been tolerating Eliquis well w/o abnormal bleeding.   3. Gastroenteritis: n/v/d resolved. K is stable.    Signed, Lyda Jester, PA-C 05/23/2015, 2:49 PM Patient seen and examined and history reviewed. Agree with above findings and plan. Pleasant 59 yo WF with long history of vasovagal syncope. She has actually had syncope with loss of teeth, head contusion and significant ankle injury related to this. Recently had afib post urologic procedure. This resolved spontaneously. She was placed on Cardizem po. Yesterday developed protracted diarrhea and N/V associated with syncope. No recurrent AFib. Noted to be bradycardic on monitor with HR dropping into high 40s. Since she has only had one episode of AFib related to a procedure I would not commit her to long term medication except for anticoagulation. Medical therapy for Afib will be problematic due to resting bradycardia and history of vasovagal episodes. In order to treat she may need a pacemaker. She may potentially be a candidate for ablation if Afib recurs. She has a follow up with Dr. Oval Linsey on Jan 4. May consider an event monitor as outpatient.   Allsion Nogales [place], [place] 05/23/2015 5:01 PM

## 2015-05-24 DIAGNOSIS — R55 Syncope and collapse: Secondary | ICD-10-CM | POA: Insufficient documentation

## 2015-05-24 DIAGNOSIS — E038 Other specified hypothyroidism: Secondary | ICD-10-CM

## 2015-05-24 LAB — GLUCOSE, CAPILLARY: Glucose-Capillary: 95 mg/dL (ref 65–99)

## 2015-05-24 NOTE — Progress Notes (Signed)
Patient Name: Kristin Bradford Date of Encounter: 05/24/2015  Principal Problem:   Syncope Active Problems:   Atrial fibrillation (HCC)   Hypothyroidism   Interstitial cystitis   Acoustic neuroma ([facility])   Nausea and vomiting   Diarrhea   IC (interstitial cystitis)   Gastroenteritis   Faintness    Primary Cardiologist: Dr. Oval Linsey Patient Profile: 59 y/o female with PMH of newly diagnosed atrial fibrillation, discovered last week during an outpatient procedure who presented to [facility] on 05/21/2015 for nausea, vomiting, and syncopal events.  SUBJECTIVE: Reports feeling significantly better. Denies any chest pain, palpitations, or shortness of breath.  OBJECTIVE Filed Vitals:   05/23/15 0357 05/23/15 1312 05/23/15 2038 05/24/15 0551  BP: 104/63 102/42 108/53 102/55  Pulse: 58 51 67 59  Temp: 98.2 F (36.8 C) 98.3 F (36.8 C) 98.5 F (36.9 C) 98.1 F (36.7 C)  TempSrc: Oral Oral Oral Oral  Resp: 18 20 18 18   Height:      Weight: 190 lb 4.8 oz (86.32 kg)   186 lb 9.6 oz (84.641 kg)  SpO2: 100% 99% 99% 97%    Intake/Output Summary (Last 24 hours) at 05/24/15 0937 Last data filed at 05/24/15 [id number]  Gross per 24 hour  Intake    480 ml  Output   1070 ml  Net   -590 ml   Filed Weights   05/22/15 0430 05/23/15 0357 05/24/15 0551  Weight: 191 lb 3.2 oz (86.728 kg) 190 lb 4.8 oz (86.32 kg) 186 lb 9.6 oz (84.641 kg)    PHYSICAL EXAM General: Well developed, well nourished, female in no acute distress. Head: Normocephalic, atraumatic.  Neck: Supple without bruits, JVD not elevated. Lungs:  Resp regular and unlabored, CTA without wheezing or rales. Heart: RRR, S1, S2, no S3, S4, or murmur; no rub. Abdomen: Soft, non-tender, non-distended with normoactive bowel sounds. No hepatomegaly. No rebound/guarding. No obvious abdominal masses. Extremities: No clubbing, cyanosis, or edema. Distal pedal pulses are 2+ bilaterally. Neuro: Alert and oriented X 3. Moves all  extremities spontaneously. Psych: Normal affect.    LABS: CBC: Recent Labs  05/22/15 0525 05/23/15 0555  WBC 5.9 5.1  HGB 11.4* 11.5*  HCT 35.0* 34.0*  MCV 88.2 88.5  PLT 193 208   INR:No results for input(s): INR in the last 72 hours. Basic Metabolic Panel: Recent Labs  05/22/15 0525 05/23/15 0555  NA 139 139  K 3.6 3.5  CL 109 106  CO2 24 26  GLUCOSE 111* 120*  BUN 6 6  CREATININE 0.84 0.81  CALCIUM 8.6* 8.8*   Liver Function Tests: Recent Labs  05/21/15 1201  AST 33  ALT 20  ALKPHOS 47  BILITOT 0.8  PROT 6.9  ALBUMIN 4.0    Recent Labs  05/21/15 1242  TROPIPOC 0.01   Thyroid Function Tests: Recent Labs  05/21/15 1718  TSH 0.355    TELE: NSR with rate in mid-50's - 70's.  No atopic events. No evidence of atrial fibrillation.     ECG: NSR with rate in 60's.   Current Medications:  . apixaban  5 mg Oral BID  . levothyroxine  50 mcg Oral QAC breakfast  . pravastatin  20 mg Oral QHS  . progesterone  100 mg Oral QHS  . sodium chloride  3 mL Intravenous Q12H      ASSESSMENT AND PLAN:  1. Syncope:  - patient has prior h/o syncope related to emesis. Her recent syncopal spell also occurred in the  setting of excessive vomitting/ dry heaving. This is likely vagal mediated syncope.  - Hr was noted to be in the 40's at times. Cardizem has been discontinued.  2. Paroxysmal Atrial Fibrillation:  - Patient currently in NSR. HR improved in the 50's - 70's.  - Continue Eliquis for anticoagulation. - Would not resume Cardizem or BB in setting of recent hypotension and vasovagal events.  3. Gastroenteritis:  - reports her symptoms are currently resolved.   Has scheduled Cardiology follow-up with Dr. Oval Linsey on 06/06/2015.  Arna Medici , PA-C 9:37 AM 05/24/2015 Pager: [phone] Patient seen and examined and history reviewed. Agree with above findings and plan. HR improved off meds. OK for DC with follow up as  scheduled.  Peter [place], [place] 05/24/2015 9:52 AM

## 2015-05-24 NOTE — Discharge Summary (Signed)
Physician Discharge Summary  Kristin Bradford [id number] DOB: 1956/03/07 DOA: 05/21/2015  PCP: Kristin Neer, MD  Admit date: 05/21/2015 Discharge date: 05/24/2015  Recommendations for Outpatient Follow-up:  1. Pt will need to follow up with PCP in 2 weeks post discharge  Discharge Diagnoses:  Vasovagal syncope -05/19/2015 echocardiogram EF 60-65%, no major valvular abnormalities -Orthostatics positive -Continue intravenous fluids-->improved Orthostatic hypotension/dehydration -Secondary to vomiting and diarrhea -Continue intravenous fluids-->improving Vomiting and diarrhea -Likely viral gastroenteritis as the patient has clinically improved after 24 hours of symptoms -Start with clear liquid diet and advance as tolerated--tolerating diet - C. Difficile negative Paroxysmal atrial fibrillation -ChadsVASc-3 (female and lacunar brain infarcts) -Continue apixiban -Hold diltiazem as the patient's blood pressure is soft in the setting of dehydration  -The patient has outpatient cardiology appointment 06/06/2015 -Presently in sinus rhythm - patient's husband requested cardiology consult while the patient is in the hospital -appreciate Dr. Martinique consult-->follow up in office 06/06/15, remain off diltiazem Fever -Likely due to viral syndrome  -Blood cultures 2 sets--neg at 48 hours -remained afebrile over 48 hours prior to d/c -hemodynamically stable off of antibiotics -Check lactic acid--1.0 -Urinalysis without any pyuria -Chest x-ray negative for infiltrates  Discharge Condition: stable  Disposition: home  Diet:heart healthy Wt Readings from Last 3 Encounters:  05/24/15 84.641 kg (186 lb 9.6 oz)  05/18/15 87.544 kg (193 lb)  05/10/14 83.915 kg (185 lb)    History of present illness:  59 year old female with a history of paroxysmal fibrillation, benign brain tumor, interstitial cystitis presented with syncope in the setting of intractable vomiting and diarrhea. The  patient was recently discharged from the hospital after developing atrial fibrillation after cystoscopy on 05/18/2015. The patient spontaneously converted to sinus rhythm during that admission and was discharged home with apixiban and diltiazem. On the evening of 05/20/2015, the patient developed intractable vomiting and diarrhea without any hematemesis, hematochezia, melena. During the episodes of vomiting and diarrhea, the patient had approximate 5 episodes of syncope lasting seconds in duration. The patient states that she has had a history of vasovagal syncope in the past associated with vomiting. Upon presentation, the patient was noted to have a fever 101.75F. The patient remained hemodynamically stable and has remained in sinus rhythm. The patient showed clinical improvement with intravenous fluids, but the patient's husband requested cardiology evaluation.  Dr. Martinique saw the pt, and he recommended staying off of diltiazem and continue apixaban and follow up in office  Consultants: Cardiology  Discharge Exam: Filed Vitals:   05/23/15 2038 05/24/15 0551  BP: 108/53 102/55  Pulse: 67 59  Temp: 98.5 F (36.9 C) 98.1 F (36.7 C)  Resp: 18 18   Filed Vitals:   05/23/15 0357 05/23/15 1312 05/23/15 2038 05/24/15 0551  BP: 104/63 102/42 108/53 102/55  Pulse: 58 51 67 59  Temp: 98.2 F (36.8 C) 98.3 F (36.8 C) 98.5 F (36.9 C) 98.1 F (36.7 C)  TempSrc: Oral Oral Oral Oral  Resp: 18 20 18 18   Height:      Weight: 86.32 kg (190 lb 4.8 oz)   84.641 kg (186 lb 9.6 oz)  SpO2: 100% 99% 99% 97%   General: A&O x 3, NAD, pleasant, cooperative Cardiovascular: RRR, no rub, no gallop, no S3 Respiratory: CTAB, no wheeze, no rhonchi Abdomen:soft, nontender, nondistended, positive bowel sounds Extremities: trace LE edema, No lymphangitis, no petechiae  Discharge Instructions      Discharge Instructions    Diet - low sodium heart healthy    Complete by:  As directed      Increase  activity slowly    Complete by:  As directed             Medication List    STOP taking these medications        diltiazem 120 MG 24 hr capsule  Commonly known as:  CARDIZEM CD      TAKE these medications        apixaban 5 MG Tabs tablet  Commonly known as:  ELIQUIS  Take 1 tablet (5 mg total) by mouth 2 (two) times daily.     DEXILANT 60 MG capsule  Generic drug:  dexlansoprazole  Take 60 mg by mouth daily.     levothyroxine 50 MCG tablet  Commonly known as:  SYNTHROID, LEVOTHROID  Take 50 mcg by mouth daily before breakfast.     polyethylene glycol powder powder  Commonly known as:  GLYCOLAX/MIRALAX  Take 0.25 Containers by mouth once.     pravastatin 20 MG tablet  Commonly known as:  PRAVACHOL  Take 20 mg by mouth at bedtime.     progesterone 100 MG capsule  Commonly known as:  PROMETRIUM  Take 100 mg by mouth at bedtime.         The results of significant diagnostics from this hospitalization (including imaging, microbiology, ancillary and laboratory) are listed below for reference.    Significant Diagnostic Studies: X-ray Chest Pa And Lateral  05/21/2015  CLINICAL DATA:  Syncope for 1 day EXAM: CHEST  2 VIEW COMPARISON:  None. FINDINGS: Lungs are clear. Heart size and pulmonary vascularity are normal. No adenopathy. No bone lesions. IMPRESSION: No edema or consolidation. Electronically Signed   By: Lowella Grip III M.D.   On: 05/21/2015 19:48   Ct Head Wo Contrast  05/21/2015  CLINICAL DATA:  Nausea and vomiting since last night. Syncopal episode with fall last night. Headache today. EXAM: CT HEAD WITHOUT CONTRAST TECHNIQUE: Contiguous axial images were obtained from the base of the skull through the vertex without intravenous contrast. 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 Focal hardware along the  superior aspect of the cranial defect causing streak artifact. The ventricles, cisterns and other CSF spaces are within normal. There is no mass, mass effect, shift of midline structures or acute hemorrhage. There is no evidence of acute infarction. 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 Remainder of the exam is unchanged. IMPRESSION: No acute intracranial findings. 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 Postsurgical change compatible with previous left mastoidectomy. Electronically Signed   By: Marin Olp M.D.   On: 05/21/2015 12:29     Microbiology: Recent Results (from the past 240 hour(s))  MRSA PCR Screening     Status: None   Collection Time: 05/18/15  4:26 PM  Result Value Ref Range Status   MRSA by PCR NEGATIVE NEGATIVE Final    Comment:        The GeneXpert MRSA Assay (FDA approved for NASAL specimens only), is one component of a comprehensive MRSA colonization surveillance program. It is not intended to diagnose MRSA infection nor to guide or monitor treatment for MRSA infections.   Culture, blood (Routine X 2) w Reflex to ID Panel     Status: None (Preliminary result)   Collection Time: 05/22/15  9:05 AM  Result Value Ref Range Status   Specimen Description BLOOD LEFT ARM  Final   Special Requests IN PEDIATRIC BOTTLE 2CC  Final   Culture NO GROWTH 1 DAY  Final   Report Status PENDING  Incomplete  Culture, blood (Routine X 2) w Reflex to ID Panel     Status: None (Preliminary result)   Collection Time: 05/22/15  9:10 AM  Result Value Ref Range Status   Specimen Description BLOOD LEFT HAND  Final   Special Requests IN PEDIATRIC BOTTLE 3 CC  Final   Culture NO GROWTH 1 DAY  Final   Report Status PENDING  Incomplete  C difficile quick screen w PCR reflex     Status: None   Collection Time: 05/23/15 10:06 AM  Result Value Ref Range Status   C Diff antigen NEGATIVE NEGATIVE Final   C Diff toxin NEGATIVE NEGATIVE Final   C Diff  interpretation Negative for toxigenic C. difficile  Final     Labs: Basic Metabolic Panel:  Recent Labs Lab 05/18/15 1724 05/19/15 0502 05/21/15 1211 05/22/15 0525 05/23/15 0555  NA 139 140 139 139 139  K 3.7 4.1 3.6 3.6 3.5  CL 101 105 108 109 106  CO2 27 26 21* 24 26  GLUCOSE 167* 138* 121* 111* 120*  BUN 11 16 16 6 6   CREATININE 0.77 0.74 0.94 0.84 0.81  CALCIUM 8.8* 9.2 9.3 8.6* 8.8*  MG 1.8  --   --   --   --    Liver Function Tests:  Recent Labs Lab 05/18/15 1724 05/21/15 1201  AST 34 33  ALT 19 20  ALKPHOS 46 47  BILITOT 0.8 0.8  PROT 7.1 6.9  ALBUMIN 4.0 4.0    Recent Labs Lab 05/21/15 1201  LIPASE 30   No results for input(s): AMMONIA in the last 168 hours. CBC:  Recent Labs Lab 05/18/15 1657 05/19/15 0502 05/21/15 1211 05/22/15 0525 05/23/15 0555  WBC 7.0 12.5* 9.3 5.9 5.1  NEUTROABS 6.6  --   --   --   --   HGB 13.0 12.3 14.5 11.4* 11.5*  HCT 39.1 37.2 43.5 35.0* 34.0*  MCV 87.9 88.4 87.9 88.2 88.5  PLT 257 253 261 193 208   Cardiac Enzymes:  Recent Labs Lab 05/18/15 1724 05/18/15 2250 05/19/15 0502  TROPONINI <0.03 <0.03 <0.03   BNP: Invalid input(s): POCBNP CBG:  Recent Labs Lab 05/21/15 1510 05/22/15 0658 05/23/15 0457 05/24/15 0548  GLUCAP 78 104* 107* 95    Time coordinating discharge:  Greater than 30 minutes  Signed:  Kamylle Axelson, DO [facility] Pager: [phone] 05/24/2015, 9:27 AM

## 2015-05-27 LAB — CULTURE, BLOOD (ROUTINE X 2)
CULTURE: NO GROWTH
Culture: NO GROWTH

## 2015-06-05 NOTE — Progress Notes (Signed)
Cardiology Office Note   Date:  06/06/2015   ID:  Kristin Bradford, DOB 1955/12/14, MRN [id number]  PCP:  Mayra Neer, MD  Cardiologist:   Sharol Harness, MD   Chief Complaint  Patient presents with  . Follow-up     new eval.//PAF// pt c/o chest tightness accompnied by nausea, feels like "dropping," pt states she does not feel like herself., loss of apetite  . Loss of Consciousness    passed out 7 times in a 6 hour period last saturday--went to ED  . Shortness of Breath    pt states it comes and goes, whether exerting herself or resting     History of Present Illness: Kristin Bradford is a 60 y.o. female with atrial fibrillation, syncope, esophagitis, and hypothyroidism who presents for management of new-onset atrial fibrillation.  [name] was diagnosed with atrial fibrillation during and outpatient cystoscopy procedure.  After the procedure she developed atrial fibrillation with rates ranging form 125-150 bpm.  She was seen in the ED where she converted spontaneously to sinus rhythm after receiving diltiazem and labetalol.  Echo revealed LVEF 60-65% and thryroid function was within normal limits.  She was started on oral diltiazem and apixaban.  She was seen in the ED on 12/21 with recurrent syncope.  The episodes occurred while using the bathroom.  She was evaluated by Dr. Martinique, who felt that the syncopal episodes were vagally mediated.  Diltiazem was discontinued and her heart rate remained well-controlled.  She hasn't been feeling like herself for the last 3 weeks.  She has been feeling weak and fatigued.  For the last several weeks she feels especially fatigued in the mornings.  She has a longstanding history of PVCs.  Her PVCs have been more bothersome in the mornings.  It is associated with nausea and poor appetite.  Her symptoms typically improve as the day continues.  There is no associated chest pain or shortness of breath.  These symptoms are distinct from when she was in  atrial fibrillation.  At that time she felt her heart racing and could barely lift her head.  She also had jaw pain.  [name] typically walks for 1 mild daily at work.  She also walks her dogs twice each day, 15 minutes each time.  She has noted that her breathing is more labored, but she denies exertional chest pain.  She feels more fatigued with exercise since September.  She denies lower extremity edema, orthopnea or PND.  [name] reports emotional stressors lately. She has been separated from her husband, though he has been very supportive through these recent health issues.     Past Medical History  Diagnosis Date  . IC (interstitial cystitis)   . Acquired deafness of left ear     S/P  RESECTION ACOUSTIC NEUROMA  2004  . 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 Acquired facial asymmetry     post surgery  . Syncope   . A-fib ([place])   . PONV (postoperative nausea and vomiting)   . Hyperlipidemia   . Hypothyroidism   . Gestational diabetes mellitus   . GERD (gastroesophageal reflux disease)   . Headache     "weekly" (05/22/2015)  . Migraine     "q several months" (05/22/2015)    Past Surgical History  Procedure Laterality Date  . Acoustic neuroma resection Left 2004  . Mastoidectomy Left 2005    LEFT FACIAL ANGIOMA  REMOVAL /  LEFT MASTOIDECTOMY [Other]  . Cysto/  hydrodistention/  instillation therapy  x4  last one 05-16-2010    since 1990's  . Colonoscopy  11/2014  . Esophagogastroduodenoscopy  11/2014  . Cataract extraction w/ intraocular lens implant Right 01/2014  . Hemorrhoid banding  05/2015  . Brain surgery    . Hemangioma excision Left 2005     FACIAL ANGIOMA REMOVAL / MASTOIDECTOMY  . Wisdom tooth extraction  1980    "all 4"  . Cysto with hydrodistension N/A 05/18/2015    Procedure: CYSTOSCOPY/HYDRODISTENSION;  Surgeon: Carolan Clines, MD;  Location: [facility];  Service: Urology;  Laterality: N/A;      Current Outpatient Prescriptions  Medication Sig Dispense Refill  . apixaban (ELIQUIS) 5 MG TABS tablet Take 1 tablet (5 mg total) by mouth 2 (two) times daily. 60 tablet 0  . dexlansoprazole (DEXILANT) 60 MG capsule Take 60 mg by mouth daily.    Marland Kitchen levothyroxine (SYNTHROID, LEVOTHROID) 50 MCG tablet Take 50 mcg by mouth daily before breakfast.    . polyethylene glycol powder (GLYCOLAX/MIRALAX) powder Take 0.25 Containers by mouth once.    . pravastatin (PRAVACHOL) 20 MG tablet Take 20 mg by mouth at bedtime.    . progesterone (PROMETRIUM) 100 MG capsule Take 100 mg by mouth at bedtime.     No current facility-administered medications for this visit.   Facility-Administered Medications Ordered in Other Visits  Medication Dose Route Frequency Provider Last Rate Last Dose  . bupivacaine (MARCAINE) 0.5 % 10 mL, triamcinolone acetonide (KENALOG-40) 40 mg injection   Subcutaneous Once Carolan Clines, MD      . bupivacaine (MARCAINE) 0.5 % 15 mL, phenazopyridine (PYRIDIUM) 400 mg bladder mixture   Bladder Instillation Once Carolan Clines, MD        Allergies:   Betadine; Codeine; Fentanyl; Penicillins; and Tetanus toxoids    Social History:  The patient  reports that she has never smoked. She has never used smokeless tobacco. She reports that she drinks about 0.6 oz of alcohol per week. She reports that she does not use illicit drugs.   Family History:  The patient's family history is not on file. She was adopted.    ROS:  Please see the history of present illness.   Otherwise, review of systems are positive for none.   All other systems are reviewed and negative.    PHYSICAL EXAM: VS:  BP 118/74 mmHg  Pulse 66  Ht 5\' 11"  (1.803 m)  Wt 85.911 kg (189 lb 6.4 oz)  BMI 26.43 kg/m2 , BMI Body mass index is 26.43 kg/(m^2). GENERAL:  Well appearing HEENT:  Pupils equal round and reactive, fundi not visualized, oral mucosa unremarkable NECK:  No jugular venous distention,  waveform within normal limits, carotid upstroke brisk and symmetric, no bruits, no thyromegaly LYMPHATICS:  No cervical adenopathy LUNGS:  Clear to auscultation bilaterally HEART:  RRR.  PMI not displaced or sustained,S1 and S2 within normal limits, no S3, no S4, no clicks, no rubs, no murmurs ABD:  Flat, positive bowel sounds normal in frequency in pitch, no bruits, no rebound, no guarding, no midline pulsatile mass, no hepatomegaly, no splenomegaly EXT:  2 plus pulses throughout, no edema, no cyanosis no clubbing SKIN:  No rashes no nodules NEURO:  Cranial nerves II through XII grossly intact, motor grossly intact throughout PSYCH:  Cognitively intact, oriented to person place and time    EKG:  EKG is ordered today. The ekg ordered today demonstrates  sinus arrhythmia.  Rate 66 bpm.    Echo 05/19/15: Study Conclusions  - Left ventricle: The cavity size was normal. Wall thickness was normal. Systolic function was normal. The estimated ejection fraction was in the range of 60% to 65%. Wall motion was normal; there were no regional wall motion abnormalities. Left ventricular diastolic function parameters were normal for the patient&'s age. - Right ventricle: The cavity size was mildly dilated. Wall thickness was normal.  Recent Labs: 05/18/2015: Magnesium 1.8 05/21/2015: ALT 20; TSH 0.355 05/23/2015: BUN 6; Creatinine, Ser 0.81; Hemoglobin 11.5*; Platelets 208; Potassium 3.5; Sodium 139    Lipid Panel No results found for: CHOL, TRIG, HDL, CHOLHDL, VLDL, LDLCALC, LDLDIRECT    Wt Readings from Last 3 Encounters:  06/06/15 85.911 kg (189 lb 6.4 oz)  05/24/15 84.641 kg (186 lb 9.6 oz)  05/18/15 87.544 kg (193 lb)      ASSESSMENT AND PLAN:  # Atrial fibrillation: Ms. Newhard has not had any recurrent episodes of atrial fibrillation.  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   This patients  CHA2DS2-VASc Score and unadjusted Ischemic Stroke Rate (% per year) is equal to 0.6 % stroke rate/year from a score of 1 Above score calculated as 1 point each if present [CHF, HTN, DM, Vascular=MI/PAD/Aortic Plaque, Age if 65-74, or Female] Above score calculated as 2 points each if present [Age > 75, or Stroke/TIA/TE]  # Fatigue: Ms. Easterlin has exertional fatigue and shortness of breath that are concerning for angina. Given that she was so symptomatic in atrial fibrillation, it is unlikely that she is having recurrent episodes that are causing her fatigue.  We will obtain a treadmill stress test to evaluate her symptoms.  Thyroid function and BMP were unremarkable.  She is mildly anemic, but not enough to cause fatigue.  She is concerned that Eliquis could be causing fatigue.  We discussed switching to another NOAC if her stress test is unremarkable.  # CV Disease Prevention: Ms. Comas reports that her lipids were checked by her PCP last year and her total cholesterol was in the 190s.    Current medicines are reviewed at length with the patient today.  The patient does not have concerns regarding medicines.  The following changes have been made:  no change  Labs/ tests ordered today include:  Orders Placed This Encounter  Procedures  . Myocardial Perfusion Imaging  . EKG 12-Lead     Disposition:   FU with Chenita Ruda C. Oval Linsey, MD, [facility] in 3 months   This note was written with the assistance of speech recognition software.  Please excuse any transcriptional errors.  Signed, Ali Mohl C. Oval Linsey, MD, [facility]  06/06/2015 1:13 PM    [place]

## 2015-06-06 ENCOUNTER — Ambulatory Visit (INDEPENDENT_AMBULATORY_CARE_PROVIDER_SITE_OTHER): Payer: PRIVATE HEALTH INSURANCE | Admitting: Cardiovascular Disease

## 2015-06-06 ENCOUNTER — Encounter: Payer: Self-pay | Admitting: Cardiovascular Disease

## 2015-06-06 VITALS — BP 118/74 | HR 66 | Ht 71.0 in | Wt 189.4 lb

## 2015-06-06 DIAGNOSIS — R079 Chest pain, unspecified: Secondary | ICD-10-CM | POA: Diagnosis not present

## 2015-06-06 DIAGNOSIS — R5383 Other fatigue: Secondary | ICD-10-CM

## 2015-06-06 NOTE — Patient Instructions (Addendum)
Medication Instructions:   Your physician recommends that you continue on your current medications as directed. Please refer to the Current Medication list given to you today.   If you need a refill on your cardiac medications before your next appointment, please call your pharmacy.  Labwork: NONE ORDER TODAY    Testing/Procedures:  Your physician has requested that you have en exercise stress myoview. For further information please visit [email or web address]. Please follow instruction sheet, as given.   Follow-Up:  IN 3 MONTHS WITH DR [facility]   Any Other Special Instructions Will Be Listed Below (If Applicable).

## 2015-06-08 ENCOUNTER — Telehealth (HOSPITAL_COMMUNITY): Payer: Self-pay

## 2015-06-08 NOTE — Telephone Encounter (Signed)
Encounter complete. 

## 2015-06-13 ENCOUNTER — Ambulatory Visit (HOSPITAL_COMMUNITY)
Admission: RE | Admit: 2015-06-13 | Discharge: 2015-06-13 | Disposition: A | Discharge status: Home / Self Care | Payer: PRIVATE HEALTH INSURANCE | Source: Ambulatory Visit | Attending: Internal Medicine | Admitting: Internal Medicine

## 2015-06-13 DIAGNOSIS — R079 Chest pain, unspecified: Secondary | ICD-10-CM | POA: Diagnosis present

## 2015-06-13 DIAGNOSIS — R5383 Other fatigue: Secondary | ICD-10-CM | POA: Diagnosis not present

## 2015-06-13 LAB — MYOCARDIAL PERFUSION IMAGING
CHL CUP NUCLEAR SRS: 0
CHL CUP NUCLEAR SSS: 0
CHL CUP RESTING HR STRESS: 59 {beats}/min
CHL RATE OF PERCEIVED EXERTION: 17
CSEPED: 8 min
Estimated workload: 7 METS
LV dias vol: 86 mL
LVSYSVOL: 30 mL
MPHR: 163 {beats}/min
Peak HR: 151 {beats}/min
Percent HR: 92 %
SDS: 0
TID: 1.06

## 2015-06-13 MED ORDER — TECHNETIUM TC 99M SESTAMIBI GENERIC - CARDIOLITE
30.1000 | Freq: Once | INTRAVENOUS | Status: AC | PRN
  Administered 2015-06-13: 30.1 via INTRAVENOUS

## 2015-06-13 MED ORDER — TECHNETIUM TC 99M SESTAMIBI GENERIC - CARDIOLITE
10.4000 | Freq: Once | INTRAVENOUS | Status: AC | PRN
  Administered 2015-06-13: 10.4 via INTRAVENOUS

## 2015-06-14 ENCOUNTER — Telehealth: Payer: Self-pay | Admitting: *Deleted

## 2015-06-14 NOTE — Telephone Encounter (Signed)
-----   Message from Skeet Latch, MD sent at 06/14/2015  5:15 PM EST ----- Normal stress test.

## 2015-06-14 NOTE — Telephone Encounter (Signed)
Left message to call back  

## 2015-06-19 MED ORDER — APIXABAN 5 MG PO TABS: 5.0000 mg | ORAL_TABLET | Freq: Two times a day (BID) | ORAL | Status: DC

## 2015-06-19 NOTE — Telephone Encounter (Signed)
Spoke to patient. Result given . Verbalized understanding  Patient wanted to know if she should continue with Eliquis ( she has one tablet left) ,since the afib was only one time episode or can she take An ASPIRIN in the place of Eliquis.  2) should she keep a prescription of the Cardizem on hand just in case - AFIB reoccurs. And if so how should she take the medications  Will defer to  Dr Oval Linsey  And contact her back

## 2015-06-19 NOTE — Telephone Encounter (Signed)
Pt is calling back in regards to her stress test results . Please f/u with her   Thanks

## 2015-06-19 NOTE — Telephone Encounter (Signed)
Spoke to husband  informed him - patient would need to continue Eliquis- Prescription was e-sent to pharmacy. Spoke to [facility]- awaiting for prescription to be processed.    informed husband - to let patient know will contact her to tomorrow with details.  Per Dr Oval Linsey , MAY KEEP CARDIZEM FOR EMERGENCY, FOLLOW UP APPOINTMENT IN 3 MONTHS

## 2015-06-19 NOTE — Telephone Encounter (Signed)
Continue Eliquis for 3 months.  Follow up in March.

## 2015-06-20 NOTE — Telephone Encounter (Signed)
SPOKE TO PATIENT,INFORMATION GIVEN  VERBALIZED UNDERSTANDING. AWARE MEDICATION IS READY FOR PICK UP- TO USE SAVING CARD.

## 2015-06-20 NOTE — Telephone Encounter (Signed)
LEFT MESSAGE TO CALL BACK

## 2015-08-20 MED FILL — PROGESTERONE 100 MG CAPSULE: 100 | 30 days supply | Qty: 30 | Fill #0

## 2015-08-31 MED FILL — DOXYCYCLINE HYCLATE 100 MG: 100 | 10 days supply | Qty: 20 | Fill #0

## 2015-09-18 MED FILL — PROGESTERONE 100 MG CAPSULE: 100 | 30 days supply | Qty: 30 | Fill #1

## 2015-09-23 NOTE — Progress Notes (Signed)
Cardiology Office Note   Date:  09/24/2015   ID:  Kristin Bradford, DOB July 19, 1955, MRN [id number]  PCP:  Mayra Neer, MD  Cardiologist:   Skeet Latch, MD   Chief Complaint  Patient presents with  . Follow-up    shortness of breath all week, felt like she was going to pass out yesterday,      Patient ID: Kristin Bradford is a 60 y.o. female with atrial fibrillation, syncope, esophagitis, and hypothyroidism who presents for management of new-onset atrial fibrillation.    Interval History 09/24/15:  After her last appointment Kristin Bradford underwent exercise Cardiolite that was negative for ischemia.  Yesterday while driving she felt like she was going to pass out.  Her face felt very full.  There was no warning prior to the episode.  She denied palpitations or shortness of breath.  She managed to get home and her blood pressure was 90/35 with a heart rate of 109.  Earlier that day she had a [place] massage.  She is unsure whether her heart rate was irregular.  This episode felt different that her atrial fibrillation postoperatively. She does that her blood pressure has been low all week. She asked the nurses at work to check it and it was in the upper 90s over 39s. Of note, she does not drink much fluid due to bladder irritation from interstitial cystitis. She usually has frequent two drinks throughout the day.  [name] also notes that her stress level has been very high lately. She is in the process of separating from her husband and he is moving out of the state this weekend.    History of Present Illness 06/06/15: Kristin Bradford was diagnosed with atrial fibrillation during and outpatient cystoscopy procedure.  After the procedure she developed atrial fibrillation with rates ranging form 125-150 bpm.  She was seen in the ED where she converted spontaneously to sinus rhythm after receiving diltiazem and labetalol.  Echo revealed LVEF 60-65% and thryroid function was within normal limits.  She was  started on oral diltiazem and apixaban.  She was seen in the ED on 12/21 with recurrent syncope.  The episodes occurred while using the bathroom.  She was evaluated by Dr. Martinique, who felt that the syncopal episodes were vagally mediated.  Diltiazem was discontinued and her heart rate remained well-controlled.  She hasn't been feeling like herself for the last 3 weeks.  She has been feeling weak and fatigued.  For the last several weeks she feels especially fatigued in the mornings.  She has a longstanding history of PVCs.  Her PVCs have been more bothersome in the mornings.  It is associated with nausea and poor appetite.  Her symptoms typically improve as the day continues.  There is no associated chest pain or shortness of breath.  These symptoms are distinct from when she was in atrial fibrillation.  At that time she felt her heart racing and could barely lift her head.  She also had jaw pain.  Kristin Bradford typically walks for 1 mild daily at work.  She also walks her dogs twice each day, 15 minutes each time.  She has noted that her breathing is more labored, but she denies exertional chest pain.  She feels more fatigued with exercise since September.  She denies lower extremity edema, orthopnea or PND.  Kristin Bradford reports emotional stressors lately. She has been separated from her husband, though he has been very supportive through these recent health issues.  Past Medical History  Diagnosis Date  . IC (interstitial cystitis)   . Acquired deafness of left ear     S/P  RESECTION ACOUSTIC NEUROMA  2004  . 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 Acquired facial asymmetry     post surgery  . Syncope   . A-fib ([place])   . PONV (postoperative nausea and vomiting)   . Hyperlipidemia   . Hypothyroidism   . Gestational diabetes mellitus   . GERD (gastroesophageal reflux disease)   . Headache     "weekly" (05/22/2015)  . Migraine     "q several months"  (05/22/2015)    Past Surgical History  Procedure Laterality Date  . Acoustic neuroma resection Left 2004  . Mastoidectomy Left 2005    LEFT FACIAL ANGIOMA REMOVAL /  LEFT MASTOIDECTOMY [Other]  . Cysto/  hydrodistention/  instillation therapy  x4  last one 05-16-2010    since 1990's  . Colonoscopy  11/2014  . Esophagogastroduodenoscopy  11/2014  . Cataract extraction w/ intraocular lens implant Right 01/2014  . Hemorrhoid banding  05/2015  . Brain surgery    . Hemangioma excision Left 2005     FACIAL ANGIOMA REMOVAL / MASTOIDECTOMY  . Wisdom tooth extraction  1980    "all 4"  . Cysto with hydrodistension N/A 05/18/2015    Procedure: CYSTOSCOPY/HYDRODISTENSION;  Surgeon: Carolan Clines, MD;  Location: [facility];  Service: Urology;  Laterality: N/A;     Current Outpatient Prescriptions  Medication Sig Dispense Refill  . apixaban (ELIQUIS) 5 MG TABS tablet Take 1 tablet (5 mg total) by mouth 2 (two) times daily. 60 tablet 6  . polyethylene glycol powder (GLYCOLAX/MIRALAX) powder Take 0.25 Containers by mouth once.    . progesterone (PROMETRIUM) 100 MG capsule Take 100 mg by mouth at bedtime.    . fludrocortisone (FLORINEF) 0.1 MG tablet Take 1 tablet (0.1 mg total) by mouth daily. 30 tablet 5   No current facility-administered medications for this visit.   Facility-Administered Medications Ordered in Other Visits  Medication Dose Route Frequency Provider Last Rate Last Dose  . bupivacaine (MARCAINE) 0.5 % 10 mL, triamcinolone acetonide (KENALOG-40) 40 mg injection   Subcutaneous Once Carolan Clines, MD      . bupivacaine (MARCAINE) 0.5 % 15 mL, phenazopyridine (PYRIDIUM) 400 mg bladder mixture   Bladder Instillation Once Carolan Clines, MD        Allergies:   Betadine; Codeine; Fentanyl; Penicillins; and Tetanus toxoids    Social History:  The patient  reports that she has never smoked. She has never used smokeless tobacco. She reports that she drinks  about 0.6 oz of alcohol per week. She reports that she does not use illicit drugs.   Family History:  The patient's family history is not on file. She was adopted.   ROS:  Please see the history of present illness.   Otherwise, review of systems are positive for none.   All other systems are reviewed and negative.    PHYSICAL EXAM: VS:  BP 100/78 mmHg  Pulse 60  Ht 5\' 11"  (1.803 m)  Wt 85.73 kg (189 lb)  BMI 26.37 kg/m2 , BMI Body mass index is 26.37 kg/(m^2). GENERAL:  Well appearing HEENT:  Pupils equal round and reactive, fundi not visualized, oral mucosa unremarkable NECK:  No jugular venous distention, waveform within normal limits, carotid upstroke brisk and symmetric, no bruits, no thyromegaly LYMPHATICS:  No cervical adenopathy  LUNGS:  Clear to auscultation bilaterally HEART:  RRR.  PMI not displaced or sustained,S1 and S2 within normal limits, no S3, no S4, no clicks, no rubs, no murmurs ABD:  Flat, positive bowel sounds normal in frequency in pitch, no bruits, no rebound, no guarding, no midline pulsatile mass, no hepatomegaly, no splenomegaly EXT:  2 plus pulses throughout, no edema, no cyanosis no clubbing SKIN:  No rashes no nodules NEURO:  Cranial nerves II through XII grossly intact, motor grossly intact throughout PSYCH:  Cognitively intact, oriented to person place and time  EKG:  EKG is not ordered today.  Exercise Cardiolite 06/13/15:  The left ventricular ejection fraction is normal (55-65%).  Nuclear stress EF: 64%.  There was no ST segment deviation noted during stress.  This is a low risk study.  The study is normal.  Breast attenuation artifact was noted both at rest and with stress.   Echo 05/19/15: Study Conclusions  - Left ventricle: The cavity size was normal. Wall thickness was normal. Systolic function was normal. The estimated ejection fraction was in the range of 60% to 65%. Wall motion was normal; there were no regional wall  motion abnormalities. Left ventricular diastolic function parameters were normal for the patient&'s age. - Right ventricle: The cavity size was mildly dilated. Wall thickness was normal.  Recent Labs: 05/18/2015: Magnesium 1.8 05/21/2015: ALT 20; TSH 0.355 05/23/2015: BUN 6; Creatinine, Ser 0.81; Hemoglobin 11.5*; Platelets 208; Potassium 3.5; Sodium 139    Lipid Panel No results found for: CHOL, TRIG, HDL, CHOLHDL, VLDL, LDLCALC, LDLDIRECT    Wt Readings from Last 3 Encounters:  09/24/15 85.73 kg (189 lb)  06/13/15 85.73 kg (189 lb)  06/06/15 85.911 kg (189 lb 6.4 oz)      ASSESSMENT AND PLAN:  # Atrial fibrillation: Ms. Dutko has not had any recurrent episodes of atrial fibrillation.  It doesn't seem like her episode of pre-syncope was related to atrial fibrillation, rather to hypotension.  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   This patients CHA2DS2-VASc Score and unadjusted Ischemic Stroke Rate (% per year) is equal to 0.6 % stroke rate/year from a score of 1 Above score calculated as 1 point each if present [CHF, HTN, DM, Vascular=MI/PAD/Aortic Plaque, Age if 65-74, or Female] Above score calculated as 2 points each if present [Age > 75, or Stroke/TIA/TE]  # Pre-syncope: Ms. Gorsline symptoms are likely attribuatble to poor po intake.  Also, her massage earlier that day may have heightened her vagal response.  She reports a history of vasovagal syncope.  I recommended increasing her oral intake of fluids. However, she does not think she'll be able to do this visit will likely worsen her interstitial cystitis. Therefore, we will start Florinef 0.1 mg daily. We will repeat a basic metabolic panel in one week.  I also recommended that she wear compression stockings and increase her salt intake.    Current medicines are reviewed at length with the patient today.  The patient does not have concerns regarding  medicines.  The following changes have been made:  no change  Labs/ tests ordered today include:  Orders Placed This Encounter  Procedures  . Basic metabolic panel     Disposition:   FU with Yue Glasheen C. Oval Linsey, MD, [facility] in 1 month   This note was written with the assistance of speech recognition software.  Please excuse any transcriptional errors.  Signed, Aleksis Jiggetts C. Oval Linsey, MD,  [facility]  09/24/2015 5:12 PM    [facility],

## 2015-09-24 ENCOUNTER — Encounter: Payer: Self-pay | Admitting: Cardiovascular Disease

## 2015-09-24 ENCOUNTER — Ambulatory Visit (INDEPENDENT_AMBULATORY_CARE_PROVIDER_SITE_OTHER): Payer: PRIVATE HEALTH INSURANCE | Admitting: Cardiovascular Disease

## 2015-09-24 VITALS — BP 100/78 | HR 60 | Ht 71.0 in | Wt 189.0 lb

## 2015-09-24 DIAGNOSIS — R55 Syncope and collapse: Secondary | ICD-10-CM

## 2015-09-24 DIAGNOSIS — I480 Paroxysmal atrial fibrillation: Secondary | ICD-10-CM

## 2015-09-24 DIAGNOSIS — Z79899 Other long term (current) drug therapy: Secondary | ICD-10-CM | POA: Diagnosis not present

## 2015-09-24 MED ORDER — FLUDROCORTISONE ACETATE 0.1 MG PO TABS: 0.1000 mg | ORAL_TABLET | Freq: Every day | ORAL | Status: DC

## 2015-09-24 MED FILL — FLUDROCORTISONE 0.1 MG TAB: 0.1 | 30 days supply | Qty: 30 | Fill #0

## 2015-09-24 MED FILL — ELIQUIS 5 MG TABLET: 5 | 30 days supply | Qty: 60 | Fill #0

## 2015-09-24 NOTE — Patient Instructions (Addendum)
Medication Instructions:  START FLORINEF 0.1 MG DAILY   Labwork: BMET IN 1 WEEK AT   Testing/Procedures: NONE  Follow-Up: Your physician recommends that you schedule a follow-up appointment in: [place]  If you need a refill on your cardiac medications before your next appointment, please call your pharmacy.

## 2015-10-23 MED FILL — PROGESTERONE 100 MG CAPSULE: 100 | 30 days supply | Qty: 30 | Fill #2

## 2015-10-26 ENCOUNTER — Ambulatory Visit (INDEPENDENT_AMBULATORY_CARE_PROVIDER_SITE_OTHER): Payer: PRIVATE HEALTH INSURANCE | Admitting: Cardiovascular Disease

## 2015-10-26 ENCOUNTER — Encounter: Payer: Self-pay | Admitting: Cardiovascular Disease

## 2015-10-26 VITALS — BP 135/75 | HR 63 | Ht 71.0 in | Wt 194.8 lb

## 2015-10-26 DIAGNOSIS — I480 Paroxysmal atrial fibrillation: Secondary | ICD-10-CM | POA: Diagnosis not present

## 2015-10-26 DIAGNOSIS — R55 Syncope and collapse: Secondary | ICD-10-CM | POA: Diagnosis not present

## 2015-10-26 NOTE — Progress Notes (Signed)
Cardiology Office Note   Date:  10/26/2015   ID:  RANIE KNOEDLER, DOB [birth year], MRN [id number]  PCP:  Mayra Neer, MD  Cardiologist:   Skeet Latch, MD   Chief Complaint  Patient presents with  . Follow-up    Lightheaded/dizziness; randomly      Patient ID: Kristin Bradford is a 60 y.o. female with post-procedural atrial fibrillation, syncope, esophagitis, and hypothyroidism who presents for follow up.  Kristin Bradford was diagnosed with atrial fibrillation during and outpatient cystoscopy procedure in 05/2015.  After the procedure she developed atrial fibrillation with rates ranging form 125-150 bpm.  She was seen in the ED where she converted spontaneously to sinus rhythm after receiving diltiazem and labetalol.  She was started on oral diltiazem and apixaban.  She was seen in the ED on 12/21 with recurrent syncope.  The episodes occurred while using the bathroom.  She was evaluated by Dr. Martinique, who felt that the syncopal episodes were vagally mediated.  Diltiazem was discontinued and her heart rate remained well-controlled.   Echo revealed LVEF 60-65% and thryroid function was within normal limits.   Since the episode of atrial fibrillation she has struggled with fatigue.  Kristin Bradford underwent exercise Cardiolite on 06/13/15 that was negative for ischemia. She does not think she has experienced any recurrent atrial fibrillation.  In April she had an episode of near syncope after getting a massage.  The symptoms started while she was driving home. When she got home her blood pressure was 90/35 and her heart rate was 109. The episode felt very different from when she had atrial fibrillation. She subsequently checked her blood pressure at work and it was typically in the 90s over 70s. She noted that she wasn't drinking very much due to interstitial cystitis and bladder irritation. Therefore, we started her on Florinef at her last appointment. Since then she think she feels slightly better. She  has had a few days where she felt like her blood pressure was low. She had it checked at work and it was reportedly within normal limits. She has not had any episodes of presyncope but feels very nervous that she may have another episode like before. She continues to have little energy especially in the mornings. She also has some lightheadedness after showering.  She has noticed that her hands are slightly swollen and there are certain rings that she cannot wear. She has not noted any lower extremity edema, orthopnea or edema.  Kristin Bradford brother is currently on hospice in [place].  She is afraid to drive that far alone.    Past Medical History  Diagnosis Date  . IC (interstitial cystitis)   . Acquired deafness of left ear     S/P  RESECTION ACOUSTIC NEUROMA  2004  . 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 Acquired facial asymmetry     post surgery  . Syncope   . A-fib ([place])   . PONV (postoperative nausea and vomiting)   . Hyperlipidemia   . Hypothyroidism   . Gestational diabetes mellitus   . GERD (gastroesophageal reflux disease)   . Headache     "weekly" (05/22/2015)  . Migraine     "q several months" (05/22/2015)    Past Surgical History  Procedure Laterality Date  . Acoustic neuroma resection Left 2004  . Mastoidectomy Left 2005    LEFT FACIAL ANGIOMA REMOVAL /  LEFT MASTOIDECTOMY [Other]  . Cysto/  hydrodistention/  instillation therapy  x4  last one 05-16-2010    since 1990's  . Colonoscopy  11/2014  . Esophagogastroduodenoscopy  11/2014  . Cataract extraction w/ intraocular lens implant Right 01/2014  . Hemorrhoid banding  05/2015  . Brain surgery    . Hemangioma excision Left 2005     FACIAL ANGIOMA REMOVAL / MASTOIDECTOMY  . Wisdom tooth extraction  1980    "all 4"  . Cysto with hydrodistension N/A 05/18/2015    Procedure: CYSTOSCOPY/HYDRODISTENSION;  Surgeon: Carolan Clines, MD;  Location: [facility];   Service: Urology;  Laterality: N/A;     Current Outpatient Prescriptions  Medication Sig Dispense Refill  . apixaban (ELIQUIS) 5 MG TABS tablet Take 1 tablet (5 mg total) by mouth 2 (two) times daily. 60 tablet 6  . fludrocortisone (FLORINEF) 0.1 MG tablet Take 1 tablet (0.1 mg total) by mouth daily. 30 tablet 5  . polyethylene glycol powder (GLYCOLAX/MIRALAX) powder Take 0.25 Containers by mouth once.    . progesterone (PROMETRIUM) 100 MG capsule Take 100 mg by mouth at bedtime.     No current facility-administered medications for this visit.   Facility-Administered Medications Ordered in Other Visits  Medication Dose Route Frequency Provider Last Rate Last Dose  . bupivacaine (MARCAINE) 0.5 % 10 mL, triamcinolone acetonide (KENALOG-40) 40 mg injection   Subcutaneous Once Carolan Clines, MD      . bupivacaine (MARCAINE) 0.5 % 15 mL, phenazopyridine (PYRIDIUM) 400 mg bladder mixture   Bladder Instillation Once Carolan Clines, MD        Allergies:   Betadine; Codeine; Fentanyl; Penicillins; and Tetanus toxoids    Social History:  The patient  reports that she has never smoked. She has never used smokeless tobacco. She reports that she drinks about 0.6 oz of alcohol per week. She reports that she does not use illicit drugs.   Family History:  The patient's family history is not on file. She was adopted.   ROS:  Please see the history of present illness.   Otherwise, review of systems are positive for none.   All other systems are reviewed and negative.    PHYSICAL EXAM: VS:  BP 135/75 mmHg  Pulse 63  Ht 5\' 11"  (1.803 m)  Wt 88.361 kg (194 lb 12.8 oz)  BMI 27.18 kg/m2 , BMI Body mass index is 27.18 kg/(m^2). GENERAL:  Well appearing HEENT:  Pupils equal round and reactive, fundi not visualized, oral mucosa unremarkable NECK:  No jugular venous distention, waveform within normal limits, carotid upstroke brisk and symmetric, no bruits LYMPHATICS:  No cervical  adenopathy LUNGS:  Clear to auscultation bilaterally HEART:  RRR.  PMI not displaced or sustained,S1 and S2 within normal limits, no S3, no S4, no clicks, no rubs, no murmurs ABD:  Flat, positive bowel sounds normal in frequency in pitch, no bruits, no rebound, no guarding, no midline pulsatile mass, no hepatomegaly, no splenomegaly EXT:  2 plus pulses throughout, no edema, no cyanosis no clubbing SKIN:  No rashes no nodules NEURO:  Cranial nerves II through XII grossly intact, motor grossly intact throughout PSYCH:  Cognitively intact, oriented to person place and time  EKG:  EKG is ordered today.  Sinus rhythm. Rate 63 bpm.    Ex it is likely that this was a post procedural event. Her left atrial ercise Cardiolite 06/13/15:  The left ventricular ejection fraction is normal (55-65%).  Nuclear stress EF: 64%.  There was no ST segment deviation noted during stress.  This is a low risk study.  The study is normal.  Breast attenuation artifact was noted both at rest and with stress.   Echo 05/19/15: Study Conclusions  - Left ventricle: The cavity size was normal. Wall thickness was normal. Systolic function was normal. The estimated ejection fraction was in the range of 60% to 65%. Wall motion was normal; there were no regional wall motion abnormalities. Left ventricular diastolic function parameters were normal for the patient&'s age. - Right ventricle: The cavity size was mildly dilated. Wall thickness was normal.  Recent Labs: 05/18/2015: Magnesium 1.8 05/21/2015: ALT 20; TSH 0.355 05/23/2015: BUN 6; Creatinine, Ser 0.81; Hemoglobin 11.5*; Platelets 208; Potassium 3.5; Sodium 139    Lipid Panel No results found for: CHOL, TRIG, HDL, CHOLHDL, VLDL, LDLCALC, LDLDIRECT    Wt Readings from Last 3 Encounters:  10/26/15 88.361 kg (194 lb 12.8 oz)  09/24/15 85.73 kg (189 lb)  06/13/15 85.73 kg (189 lb)      ASSESSMENT AND PLAN:  # Atrial fibrillation: Ms.  Bradford has not had any recurrent episodes of atrial fibrillation.  It doesn't seem like her episode of pre-syncope was related to atrial fibrillation.  I don't think that she has experienced any recurrent atrial fibrillation.  It is likely that this was a post-procedural event.  Her only risk factor is female gender.  We will obtain a 7 day event monitor to assess for occult atrial fibrillation.  If this is negative, we will stop the Eliquis.   This patients CHA2DS2-VASc Score and unadjusted Ischemic Stroke Rate (% per year) is equal to 0.6 % stroke rate/year from a score of 1 Above score calculated as 1 point each if present [CHF, HTN, DM, Vascular=MI/PAD/Aortic Plaque, Age if 65-74, or Female] Above score calculated as 2 points each if present [Age > 75, or Stroke/TIA/TE]  # Orthostatic hypotension: Kristin Bradford symptoms are likely attribuatble to poor po intake which is causing low BP.  She is unable to drink more due to interstitial cystitis.  He has a history of syncope after GI illness and diarrhea, which is likely a combination of orthostasis and neurocardiogenic syncope.  She is doing slightly better on Florinef.  She has been taking it at night.  I asked her to start taking it in the AM, which should help her symptoms throughout the day.    Current medicines are reviewed at length with the patient today.  The patient does not have concerns regarding medicines.  The following changes have been made:  no change  Labs/ tests ordered today include:  Orders Placed This Encounter  Procedures  . Cardiac event monitor  . EKG 12-Lead     Disposition:   FU with Mykala Mccready C. Oval Linsey, MD, [facility] in 3 months   This note was written with the assistance of speech recognition software.  Please excuse any transcriptional errors.  Signed, Terre Hanneman C. Oval Linsey, MD, [facility]  10/26/2015 5:28 PM    [place]

## 2015-10-26 NOTE — Patient Instructions (Signed)
Medication Instructions:  Your physician recommends that you continue on your current medications as directed. Please refer to the Current Medication list given to you today.  Labwork: NONE  Testing/Procedures: Your physician has recommended that you wear an event monitor. Event monitors are medical devices that record the heart's electrical activity. Doctors most often [place] these monitors to diagnose arrhythmias. Arrhythmias are problems with the speed or rhythm of the heartbeat. The monitor is a small, portable device. You can wear one while you do your normal daily activities. This is usually used to diagnose what is causing palpitations/syncope (passing out). 7 DAY NEXT WEEK  Follow-Up: Your physician recommends that you schedule a follow-up appointment in: [place]  If you need a refill on your cardiac medications before your next appointment, please call your pharmacy.

## 2015-10-30 ENCOUNTER — Encounter (INDEPENDENT_AMBULATORY_CARE_PROVIDER_SITE_OTHER): Payer: PRIVATE HEALTH INSURANCE

## 2015-10-30 DIAGNOSIS — I480 Paroxysmal atrial fibrillation: Secondary | ICD-10-CM

## 2015-11-05 ENCOUNTER — Telehealth: Payer: Self-pay | Admitting: Cardiovascular Disease

## 2015-11-05 NOTE — Telephone Encounter (Signed)
Spoke with patient and she did accidentally push the syncope button last week on her monitor but did NOT pass out. Did feel like she was going to pass out this am but is having a lot of stress. Advised patient nothing received from [employer] so nothing serious/critical  She does turn in monitor tomorrow, will await monitor results

## 2015-11-05 NOTE — Telephone Encounter (Signed)
Kristin Bradford is returning your call

## 2015-11-13 MED FILL — ELIQUIS 5 MG TABLET: 5 | 30 days supply | Qty: 60 | Fill #1

## 2015-11-16 ENCOUNTER — Telehealth: Payer: Self-pay | Admitting: Cardiovascular Disease

## 2015-11-16 DIAGNOSIS — I493 Ventricular premature depolarization: Secondary | ICD-10-CM

## 2015-11-16 DIAGNOSIS — I491 Atrial premature depolarization: Secondary | ICD-10-CM

## 2015-11-16 DIAGNOSIS — I471 Supraventricular tachycardia: Secondary | ICD-10-CM

## 2015-11-16 NOTE — Telephone Encounter (Signed)
Document has been scanned into Epic and awaiting provider review.

## 2015-11-16 NOTE — Telephone Encounter (Signed)
Returned call to patient and informed her report is pending Dr. Blenda Mounts review. Will plan f/u w patient Monday or Tuesday next week w/ findings.  Pt notes she may be reached at same work extension during daily regular business hours.

## 2015-11-16 NOTE — Telephone Encounter (Signed)
New Message\  Pt requested to speak w/ RN about her heart monitor- wanted to know time table on when she would get results. Please call back and discuss.

## 2015-11-16 NOTE — Telephone Encounter (Signed)
Notes Recorded by Skeet Latch, MD on 11/16/2015 at 3:14 PM Monitor does not appear to show atrial fibrillation, but does show PVCs (early beats from the bottom chambers) and atrial tachycardia, which is different from atrial fibrillation and does not cause stroke.  It can cause palpitations.  OK to stop Eliquis.

## 2015-11-19 NOTE — Telephone Encounter (Signed)
F/u  Pt returning RN phone call. Please call back and discuss.   

## 2015-11-19 NOTE — Telephone Encounter (Deleted)
-----   Message from Skeet Latch, MD sent at 11/16/2015  3:14 PM EDT ----- Monitor does not appear to show atrial fibrillation, but does show PVCs (early beats from the bottom chambers) and atrial tachycardia, which is different from atrial fibrillation and does not cause stroke.  It can cause palpitations.  OK to stop Eliquis.

## 2015-11-19 NOTE — Telephone Encounter (Signed)
Results of monitor explained. Pt voiced understanding, but states she is still concerned about the episodes of BP dropping she's had prior to and while wearing her monitor -- She notes bc of these episodes she is afraid to drive long distances. Asking if the atrial tachycardia might explain this - I stated there could be a relationship - she wanted this explained a little bit more in detail by Dr. Oval Linsey if possible. She is also asking if there is anything she can do to avoid these episodes, and what else needs to be done as far as investigating her symptoms.  Pt aware I will route to Dr. Oval Linsey for review.

## 2015-11-19 NOTE — Telephone Encounter (Signed)
Left msg to call.

## 2015-11-19 NOTE — Telephone Encounter (Signed)
Spoke with patient this am and she preferred speaking with Dr Oval Linsey

## 2015-11-19 NOTE — Telephone Encounter (Signed)
I suspect that her syncopal episodes are related more to the low BP than atrial tachycardia.  The syncopal episode she had while wearing the monitor showed sinus rhythm without any arrhythmia.   Some of the times she felt fluttering it was PACs, PVCs or atrial tachycardia.  Medicines to prevent this will lower her BP.  She may benefit from an EP referral to discuss antiarrhythmics.

## 2015-11-19 NOTE — Telephone Encounter (Signed)
F/u  Pt returning [place] phone call- please use work #- [phone] (W) Please call back and discuss.

## 2015-11-20 NOTE — Addendum Note (Signed)
Addended by: Alvina Filbert B on: 11/20/2015 04:38 PM   Modules accepted: Orders

## 2015-11-20 NOTE — Telephone Encounter (Signed)
Advised patient. Order place in Epic for EP referral

## 2015-11-22 MED FILL — PROGESTERONE 100 MG CAPSULE: 100 | 30 days supply | Qty: 30 | Fill #3

## 2015-12-12 MED FILL — VENLAFAXINE HCL ER 37.5 MG: 37.5 | 30 days supply | Qty: 60 | Fill #0

## 2015-12-13 ENCOUNTER — Ambulatory Visit (INDEPENDENT_AMBULATORY_CARE_PROVIDER_SITE_OTHER): Payer: PRIVATE HEALTH INSURANCE | Admitting: Internal Medicine

## 2015-12-13 ENCOUNTER — Encounter: Payer: Self-pay | Admitting: Internal Medicine

## 2015-12-13 ENCOUNTER — Encounter: Payer: Self-pay | Admitting: Cardiovascular Disease

## 2015-12-13 VITALS — BP 110/76 | HR 72 | Ht 71.0 in | Wt 191.6 lb

## 2015-12-13 DIAGNOSIS — I493 Ventricular premature depolarization: Secondary | ICD-10-CM | POA: Diagnosis not present

## 2015-12-13 DIAGNOSIS — I471 Supraventricular tachycardia: Secondary | ICD-10-CM | POA: Diagnosis not present

## 2015-12-13 NOTE — Patient Instructions (Signed)
Medication Instructions:  Your physician recommends that you continue on your current medications as directed. Please refer to the Current Medication list given to you today.   Labwork: None ordered   Testing/Procedures: None ordered   Follow-Up:  Your physician recommends that you schedule a follow-up appointment in: 6 weeks with Dr Lovena Le   Any Other Special Instructions Will Be Listed Below (If Applicable).     If you need a refill on your cardiac medications before your next appointment, please call your pharmacy.

## 2015-12-13 NOTE — Progress Notes (Signed)
HPI Kristin Bradford is referred today for evaluation of palpitations and PVC's and PAC's and PAF. She is a pleasant 60 yo woman who has been healthy. She had post op atrial fib several months ago which resolved. She has continued to have palpitations associate with dizziness and some sob. She wore a cardiac monitor which demonstrated no atrial fib but some NS atrial tachy, PVC's and PAC's. She has not had frank syncope. She is sedentary. She has had some problems with low blood pressure and could not tolerate cardizem.  Allergies  Allergen Reactions  . Betadine [Povidone Iodine] Rash  . Codeine     unknown  . Fentanyl Nausea And Vomiting  . Other     Steroid injections make her hair fall out  . Penicillins Itching    Has patient had a PCN reaction causing immediate rash, facial/tongue/throat swelling, SOB or lightheadedness with hypotension: Yes Has patient had a PCN reaction causing severe rash involving mucus membranes or skin necrosis: No Has patient had a PCN reaction that required hospitalization No Has patient had a PCN reaction occurring within the last 10 years: No If all of the above answers are "NO", then may proceed with Cephalosporin use.   . Tetanus Toxoids Nausea And Vomiting and Other (See Comments)    High fever     Current Outpatient Prescriptions  Medication Sig Dispense Refill  . polyethylene glycol powder (GLYCOLAX/MIRALAX) powder Take 0.25 Containers by mouth daily.     . progesterone (PROMETRIUM) 100 MG capsule Take 100 mg by mouth at bedtime.     No current facility-administered medications for this visit.   Facility-Administered Medications Ordered in Other Visits  Medication Dose Route Frequency Provider Last Rate Last Dose  . bupivacaine (MARCAINE) 0.5 % 10 mL, triamcinolone acetonide (KENALOG-40) 40 mg injection   Subcutaneous Once Carolan Clines, MD      . bupivacaine (MARCAINE) 0.5 % 15 mL, phenazopyridine (PYRIDIUM) 400 mg bladder mixture    Bladder Instillation Once Carolan Clines, MD         Past Medical History  Diagnosis Date  . IC (interstitial cystitis)   . Acquired deafness of left ear     S/P  RESECTION ACOUSTIC NEUROMA  2004  . 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 Acquired facial asymmetry     post surgery  . Syncope   . A-fib ([place])   . PONV (postoperative nausea and vomiting)   . Hyperlipidemia   . Hypothyroidism   . Gestational diabetes mellitus   . GERD (gastroesophageal reflux disease)   . Headache     "weekly" (05/22/2015)  . Migraine     "q several months" (05/22/2015)    ROS:   All systems reviewed and negative except as noted in the HPI.   Past Surgical History  Procedure Laterality Date  . Acoustic neuroma resection Left 2004  . Mastoidectomy Left 2005    LEFT FACIAL ANGIOMA REMOVAL /  LEFT MASTOIDECTOMY [Other]  . Cysto/  hydrodistention/  instillation therapy  x4  last one 05-16-2010    since 1990's  . Colonoscopy  11/2014  . Esophagogastroduodenoscopy  11/2014  . Cataract extraction w/ intraocular lens implant Right 01/2014  . Hemorrhoid banding  05/2015  . Brain surgery    . Hemangioma excision Left 2005     FACIAL ANGIOMA REMOVAL / MASTOIDECTOMY  . Wisdom tooth extraction  1980    "all 4"  .  Cysto with hydrodistension N/A 05/18/2015    Procedure: CYSTOSCOPY/HYDRODISTENSION;  Surgeon: Carolan Clines, MD;  Location: [facility];  Service: Urology;  Laterality: N/A;     Family History  Problem Relation Age of Onset  . Adopted: Yes     Social History   Social History  . Marital Status: Legally Separated    Spouse Name: N/A  . Number of Children: N/A  . Years of Education: N/A   Occupational History  . Not on file.   Social History Main Topics  . Smoking status: Never Smoker   . Smokeless tobacco: Never Used  . Alcohol Use: 0.6 oz/week    1 Glasses of wine per week  . Drug Use: No  . Sexual Activity:  Not Currently    Birth Control/ Protection: None   Other Topics Concern  . Not on file   Social History Narrative     BP 110/76 mmHg  Pulse 72  Ht 5\' 11"  (1.803 m)  Wt 191 lb 9.6 oz (86.909 kg)  BMI 26.73 kg/m2  Physical Exam:  Well appearing middle aged woman, NAD HEENT: Unremarkable Neck:  6 cm JVD, no thyromegally Lymphatics:  No adenopathy Back:  No CVA tenderness Lungs:  Clear with no wheezes HEART:  Regular rate rhythm, no murmurs, no rubs, no clicks Abd:  soft, positive bowel sounds, no organomegally, no rebound, no guarding Ext:  2 plus pulses, no edema, no cyanosis, no clubbing Skin:  No rashes no nodules Neuro:  CN II through XII intact, motor grossly intact  Cardiac monitor - reviewed and as noted above.   Assess/Plan: 1. PAF - she has had no recurrence. She had been on Eliquis. Her CHADSVASC score is 1. I have recommended stopping Eliquis. Low dose ASA would be an option. 2. Palpitations - she is symptomatic with he PAC's, PVC's and PAF. I have offered her flecainide. She is reflecting on this.  3. Hypotension - she appears to have some autonomic dysfunction by her history. I have recommended she increase her salt and fluid intake as tolerated.   Mikle Bosworth.D.

## 2015-12-24 MED FILL — PROGESTERONE 100 MG CAPSULE: 100 | 30 days supply | Qty: 30 | Fill #4

## 2016-01-08 ENCOUNTER — Encounter: Payer: Self-pay | Admitting: Internal Medicine

## 2016-01-18 MED FILL — PROGESTERONE 100 MG CAPSULE: 100 | 30 days supply | Qty: 30 | Fill #5

## 2016-01-21 MED FILL — VENLAFAXINE HCL ER 37.5 MG: 37.5 | 30 days supply | Qty: 60 | Fill #0

## 2016-01-23 ENCOUNTER — Ambulatory Visit: Payer: PRIVATE HEALTH INSURANCE | Admitting: Internal Medicine

## 2016-01-25 ENCOUNTER — Ambulatory Visit: Payer: PRIVATE HEALTH INSURANCE | Admitting: Cardiovascular Disease

## 2016-02-18 MED FILL — PROGESTERONE 100 MG CAPSULE: 100 | 30 days supply | Qty: 30 | Fill #6

## 2016-03-07 ENCOUNTER — Ambulatory Visit: Payer: PRIVATE HEALTH INSURANCE | Admitting: Internal Medicine

## 2016-03-23 MED FILL — PROGESTERONE 100 MG CAPSULE: 100 | 30 days supply | Qty: 30 | Fill #7

## 2016-04-07 MED FILL — VENLAFAXINE HCL ER 37.5 MG: 37.5 | 30 days supply | Qty: 60 | Fill #0

## 2016-04-18 MED FILL — CEPHALEXIN 500 MG CAPSULE: 500 | 6 days supply | Qty: 24 | Fill #0

## 2016-04-29 MED FILL — PROGESTERONE 100 MG CAPSULE: 100 | 30 days supply | Qty: 30 | Fill #8

## 2016-05-28 MED FILL — VENLAFAXINE HCL ER 37.5 MG: 37.5 | 30 days supply | Qty: 60 | Fill #1

## 2016-05-28 MED FILL — PROGESTERONE 100 MG CAPSULE: 100 | 30 days supply | Qty: 30 | Fill #0

## 2016-06-30 MED FILL — PROGESTERONE 100 MG CAPSULE: 100 | 30 days supply | Qty: 30 | Fill #1

## 2016-07-23 MED FILL — ALPRAZolam 0.5 MG TABS: 0.5 | 15 days supply | Qty: 30 | Fill #0

## 2016-07-31 MED FILL — PROGESTERONE 100 MG CAPSULE: 100 | 30 days supply | Qty: 30 | Fill #2

## 2016-08-14 MED FILL — VENLAFAXINE HCL ER 37.5 MG: 37.5 | 30 days supply | Qty: 60 | Fill #2

## 2016-09-02 MED FILL — PROGESTERONE 100 MG CAPSULE: 100 | 30 days supply | Qty: 30 | Fill #3

## 2016-09-29 MED FILL — PROGESTERONE 100 MG CAPSULE: 100 | 30 days supply | Qty: 30 | Fill #4

## 2016-10-06 ENCOUNTER — Ambulatory Visit (INDEPENDENT_AMBULATORY_CARE_PROVIDER_SITE_OTHER): Payer: PRIVATE HEALTH INSURANCE | Admitting: Orthopaedic Surgery

## 2016-10-06 DIAGNOSIS — M25552 Pain in left hip: Secondary | ICD-10-CM

## 2016-10-06 DIAGNOSIS — M1612 Unilateral primary osteoarthritis, left hip: Secondary | ICD-10-CM | POA: Diagnosis not present

## 2016-10-06 NOTE — Progress Notes (Signed)
Office Visit Note   Patient: Kristin Bradford           Date of Birth: 1955-09-17           MRN: [id number] Visit Date: 10/06/2016              Requested by: Mayra Neer, MD [address] [place], [place] [address] PCP: Mayra Neer, MD   Assessment & Plan: Visit Diagnoses:  1. Pain of left hip joint   2. Unilateral primary osteoarthritis, left hip     Plan: This is certainly a difficult situation. The arthroscopy specialist opinion was that a hip arthroscopy could end up causing more damage to the cartilage of her hip due to held deep and covered her femoral head is within the acetabulum. I've seen that before where hip arthroscopy is performed and the patient shortly thereafter needs a hip replacement due to injury to the cartilage from the arthroscopy. At this point another option would be considering hip replacement surgery. The goal of the surgery be decreased pain, improve mobility, and overall improved quality of life. Given her pain is 10 out of 10 and she seems is miserable at this point she does wish proceed with that surgery. We had a long and thorough discussion about the risk and benefits of surgery including showing her hip model explaining what the surgery involves in detail. All questions were encouraged and answered. She would like to have this surgery set up in the near future given her decreased mobility increased pain with decreased quality of life. We will work on getting this set up and we'll see her back in 2 weeks postoperative but no x-rays of be needed at that visit.  Follow-Up Instructions: Return for 2 weeks post-op.   Orders:  No orders of the defined types were placed in this encounter.  No orders of the defined types were placed in this encounter.     Procedures: No procedures performed   Clinical Data: No additional findings.   Subjective: No chief complaint on file. The patient is a very pleasant 61 year old female who comes  in for evaluation of her left hip. She is a patient of Dr. Mayra Neer. The hip pain is been significant since an injury that occurred in August 2017. She denies any hip problems before this injury. She was walking in a parking lot at [facility] and tripped over a metal beam. She developed pain in her groin after that. Eventually an MRI was obtained which was an MRI arthrogram and it showed labral tearing. She's had 2 intra-articular injections they gave her significant relief but only for short amount of time. Her symptoms are mechanical in nature. Pivoting activities cause severe pain in the groin. Is gotten to where it detrimentally affects her activity is daily living, her quality of life, and her mobility. She did see a hip arthroscopy specialist at [facility] in [place]. This was Dr. Aretha Parrot. He felt that an arthroscopic intervention was warranted however this would likely lead to worsening arthritic symptoms over short period time and she would likely end up with a hip replacement in a short period time. She came to me today to talk about the possibility of hip replacement surgery.  HPI  Review of Systems She currently denies any headache, chest pain, short of breath, fever, chills, nausea, vomiting.  Objective: Vital Signs: There were no vitals taken for this visit.  Physical Exam She is alert and  oriented 3 and in no acute distress; she is walking without assistive devices well. Ortho Exam She is a very pleasant individual to examine. Both knees have normal exam as well as her right hip. Her left hip has severe pain with attempts of internal/external rotation. There is no blocks to rotation but the extreme of internal rotation causes severe pain in the groin. Flexion does not cause a significant amount of pain but it does cause a little bit of discomfort. There is no pain of the trochanteric area. She does have some pain over the shin. Her leg lengths  are equal. She is neurovascularly intact. Specialty Comments:  No specialty comments available.  Imaging: No results found. Plain films and MRI are independently reviewed by me. As far as her hip plain films go both hips are well located. There is minimal joint space narrowing on both hips and they're equal. There is no acute findings on plain film. Mildly comment on these films is that her hip ball is more deeply set and the acetabulum weight side was certainly can make arthroscopy difficult.  I was able to review the MRI of her left hip. The left hip shows an anterior and superior labral tear. There is some mild degenerative changes in both hips in terms of minimal joint space narrowing in particular osteophytes. There is no severe cartilage deficits other than the labral tear  PMFS History: Patient Active Problem List   Diagnosis Date Noted  . Unilateral primary osteoarthritis, left hip 10/06/2016  . Faintness   . Syncope 05/21/2015  . Nausea and vomiting 05/21/2015  . Diarrhea 05/21/2015  . Gastroenteritis 05/21/2015  . IC (interstitial cystitis)   . Atrial fibrillation, new onset ([place]) 05/18/2015  . Atrial fibrillation ([place]) 05/18/2015  . Hypothyroidism 05/18/2015  . Interstitial cystitis 05/18/2015  . Acoustic neuroma ([place]) 05/18/2015   Past Medical History:  Diagnosis Date  . A-fib ([place])   . Acquired deafness of left ear    S/P  RESECTION ACOUSTIC NEUROMA  2004  . Acquired facial asymmetry    post surgery  . GERD (gastroesophageal reflux disease)   . Gestational diabetes mellitus   . Headache    "weekly" (05/22/2015)  . 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 Hyperlipidemia   . Hypothyroidism   . IC (interstitial cystitis)   . Migraine    "q several months" (05/22/2015)  . PONV (postoperative nausea and vomiting)   . Syncope     Family History  Problem Relation Age of Onset  . Adopted: Yes    Past Surgical History:    Procedure Laterality Date  . ACOUSTIC NEUROMA RESECTION Left 2004  . BRAIN SURGERY    . CATARACT EXTRACTION W/ INTRAOCULAR LENS IMPLANT Right 01/2014  . COLONOSCOPY  11/2014  . CYSTO WITH HYDRODISTENSION N/A 05/18/2015   Procedure: CYSTOSCOPY/HYDRODISTENSION;  Surgeon: Carolan Clines, MD;  Location: [facility];  Service: Urology;  Laterality: N/A;  . CYSTO/  HYDRODISTENTION/  INSTILLATION THERAPY  x4  last one 05-16-2010   since 1990's  . ESOPHAGOGASTRODUODENOSCOPY  11/2014  . HEMANGIOMA EXCISION Left 2005    FACIAL ANGIOMA REMOVAL / MASTOIDECTOMY  . HEMORRHOID BANDING  05/2015  . MASTOIDECTOMY Left 2005   LEFT FACIAL ANGIOMA REMOVAL /  LEFT MASTOIDECTOMY [Other]  . [place]   "all 4"   Social History   Occupational History  . Not on file.   Social History Main Topics  .  Smoking status: Never Smoker  . Smokeless tobacco: Never Used  . Alcohol use 0.6 oz/week    1 Glasses of wine per week  . Drug use: No  . Sexual activity: Not Currently    Birth control/ protection: None

## 2016-10-13 MED FILL — VENLAFAXINE HCL ER 37.5 MG: 37.5 | 30 days supply | Qty: 60 | Fill #3

## 2016-10-14 NOTE — Progress Notes (Signed)
Please place orders in EPIC as patient is being scheduled for a Pre-op appointment! Thank you! 

## 2016-10-15 ENCOUNTER — Other Ambulatory Visit (INDEPENDENT_AMBULATORY_CARE_PROVIDER_SITE_OTHER): Payer: Self-pay | Admitting: Physician Assistant

## 2016-10-15 ENCOUNTER — Ambulatory Visit (INDEPENDENT_AMBULATORY_CARE_PROVIDER_SITE_OTHER): Payer: Self-pay | Admitting: Orthopaedic Surgery

## 2016-10-15 NOTE — Patient Instructions (Addendum)
Kristin Bradford  10/15/2016   Your procedure is scheduled on:10/24/16   Report to [place]  elevators to 3rd floor to  [place] at    [place] [facility].    Call this number if you have problems the morning of surgery [phone]    Remember: ONLY 1 PERSON MAY GO WITH YOU TO SHORT STAY TO GET  READY MORNING OF [place].  Do not eat food or drink liquids :After Midnight.     Take these medicines the morning of surgery with A SIP OF WATER: venlafaxine, xanax if needed                                You may not have any metal on your body including hair pins and              piercings  Do not wear jewelry, make-up, lotions, powders or perfumes, deodorant             Do not wear nail polish.  Do not shave  48 hours prior to surgery.     Do not bring valuables to the hospital. [place].  Contacts, dentures or bridgework may not be worn into surgery.  Leave suitcase in the car. After surgery it may be brought to your room.                 Please read over the following fact sheets you were given: _____________________________________________________________________            [facility] - Preparing for Surgery Before surgery, you can play an important role.  Because skin is not sterile, your skin needs to be as free of germs as possible.  You can reduce the number of germs on your skin by washing with CHG (chlorahexidine gluconate) soap before surgery.  CHG is an antiseptic cleaner which kills germs and bonds with the skin to continue killing germs even after washing. Please DO NOT use if you have an allergy to CHG or antibacterial soaps.  If your skin becomes reddened/irritated stop using the CHG and inform your nurse when you arrive at Short Stay. Do not shave (including legs and underarms) for at least 48 hours prior to the first CHG shower.  You may shave your  face/neck. Please follow these instructions carefully:  1.  Shower with CHG Soap the night before surgery and the  morning of Surgery.  2.  If you choose to wash your hair, wash your hair first as usual with your  normal  shampoo.  3.  After you shampoo, rinse your hair and body thoroughly to remove the  shampoo.                           4.  Use CHG as you would any other liquid soap.  You can apply chg directly  to the skin and wash                       Gently with a scrungie or clean washcloth.  5.  Apply the CHG  Soap to your body ONLY FROM THE NECK DOWN.   Do not use on face/ open                           Wound or open sores. Avoid contact with eyes, ears mouth and genitals (private parts).                       Wash face,  Genitals (private parts) with your normal soap.             6.  Wash thoroughly, paying special attention to the area where your surgery  will be performed.  7.  Thoroughly rinse your body with warm water from the neck down.  8.  DO NOT shower/wash with your normal soap after using and rinsing off  the CHG Soap.                9.  Pat yourself dry with a clean towel.            10.  Wear clean pajamas.            11.  Place clean sheets on your bed the night of your first shower and do not  sleep with pets. Day of Surgery : Do not apply any lotions/deodorants the morning of surgery.  Please wear clean clothes to the hospital/surgery center.  FAILURE TO FOLLOW THESE INSTRUCTIONS MAY RESULT IN THE CANCELLATION OF YOUR SURGERY PATIENT SIGNATURE_________________________________  NURSE SIGNATURE__________________________________  ________________________________________________________________________  WHAT IS A BLOOD TRANSFUSION? Blood Transfusion Information  A transfusion is the replacement of blood or some of its parts. Blood is made up of multiple cells which provide different functions.  Red blood cells carry oxygen and are used for blood loss  replacement.  White blood cells fight against infection.  Platelets control bleeding.  Plasma helps clot blood.  Other blood products are available for specialized needs, such as hemophilia or other clotting disorders. BEFORE THE TRANSFUSION  Who gives blood for transfusions?   Healthy volunteers who are fully evaluated to make sure their blood is safe. This is blood bank blood. Transfusion therapy is the safest it has ever been in the practice of medicine. Before blood is taken from a donor, a complete history is taken to make sure that person has no history of diseases nor engages in risky social behavior (examples are intravenous drug use or sexual activity with multiple partners). The donor's travel history is screened to minimize risk of transmitting infections, such as malaria. The donated blood is tested for signs of infectious diseases, such as HIV and hepatitis. The blood is then tested to be sure it is compatible with you in order to minimize the chance of a transfusion reaction. If you or a relative donates blood, this is often done in anticipation of surgery and is not appropriate for emergency situations. It takes many days to process the donated blood. RISKS AND COMPLICATIONS Although transfusion therapy is very safe and saves many lives, the main dangers of transfusion include:   Getting an infectious disease.  Developing a transfusion reaction. This is an allergic reaction to something in the blood you were given. Every precaution is taken to prevent this. The decision to have a blood transfusion has been considered carefully by your caregiver before blood is given. Blood is not given unless the benefits outweigh the risks. AFTER THE TRANSFUSION  Right after receiving a blood transfusion, you  will usually feel much better and more energetic. This is especially true if your red blood cells have gotten low (anemic). The transfusion raises the level of the red blood cells which  carry oxygen, and this usually causes an energy increase.  The nurse administering the transfusion will monitor you carefully for complications. HOME CARE INSTRUCTIONS  No special instructions are needed after a transfusion. You may find your energy is better. Speak with your caregiver about any limitations on activity for underlying diseases you may have. SEEK MEDICAL CARE IF:   Your condition is not improving after your transfusion.  You develop redness or irritation at the intravenous (IV) site. SEEK IMMEDIATE MEDICAL CARE IF:  Any of the following symptoms occur over the next 12 hours:  Shaking chills.  You have a temperature by mouth above 102 F (38.9 C), not controlled by medicine.  Chest, back, or muscle pain.  People around you feel you are not acting correctly or are confused.  Shortness of breath or difficulty breathing.  Dizziness and fainting.  You get a rash or develop hives.  You have a decrease in urine output.  Your urine turns a dark color or changes to pink, red, or brown. Any of the following symptoms occur over the next 10 days:  You have a temperature by mouth above 102 F (38.9 C), not controlled by medicine.  Shortness of breath.  Weakness after normal activity.  The white part of the eye turns yellow (jaundice).  You have a decrease in the amount of urine or are urinating less often.  Your urine turns a dark color or changes to pink, red, or brown. Document Released: 05/16/2000 Document Revised: 08/11/2011 Document Reviewed: 01/03/2008 ExitCare Patient Information 2014 [place].  _______________________________________________________________________  Incentive Spirometer  An incentive spirometer is a tool that can help keep your lungs clear and active. This tool measures how well you are filling your lungs with each breath. Taking long deep breaths may help reverse or decrease the chance of developing breathing (pulmonary) problems  (especially infection) following:  A long period of time when you are unable to move or be active. BEFORE THE PROCEDURE   If the spirometer includes an indicator to show your best effort, your nurse or respiratory therapist will set it to a desired goal.  If possible, sit up straight or lean slightly forward. Try not to slouch.  Hold the incentive spirometer in an upright position. INSTRUCTIONS FOR USE  1. Sit on the edge of your bed if possible, or sit up as far as you can in bed or on a chair. 2. Hold the incentive spirometer in an upright position. 3. Breathe out normally. 4. Place the mouthpiece in your mouth and seal your lips tightly around it. 5. Breathe in slowly and as deeply as possible, raising the piston or the ball toward the top of the column. 6. Hold your breath for 3-5 seconds or for as long as possible. Allow the piston or ball to fall to the bottom of the column. 7. Remove the mouthpiece from your mouth and breathe out normally. 8. Rest for a few seconds and repeat Steps 1 through 7 at least 10 times every 1-2 hours when you are awake. Take your time and take a few normal breaths between deep breaths. 9. The spirometer may include an indicator to show your best effort. Use the indicator as a goal to work toward during each repetition. 10. After each set of 10 deep breaths, practice  coughing to be sure your lungs are clear. If you have an incision (the cut made at the time of surgery), support your incision when coughing by placing a pillow or rolled up towels firmly against it. Once you are able to get out of bed, walk around indoors and cough well. You may stop using the incentive spirometer when instructed by your caregiver.  RISKS AND COMPLICATIONS  Take your time so you do not get dizzy or light-headed.  If you are in pain, you may need to take or ask for pain medication before doing incentive spirometry. It is harder to take a deep breath if you are having  pain. AFTER USE  Rest and breathe slowly and easily.  It can be helpful to keep track of a log of your progress. Your caregiver can provide you with a simple table to help with this. If you are using the spirometer at home, follow these instructions: [place] IF:   You are having difficultly using the spirometer.  You have trouble using the spirometer as often as instructed.  Your pain medication is not giving enough relief while using the spirometer.  You develop fever of 100.5 F (38.1 C) or higher. SEEK IMMEDIATE MEDICAL CARE IF:   You cough up bloody sputum that had not been present before.  You develop fever of 102 F (38.9 C) or greater.  You develop worsening pain at or near the incision site. MAKE SURE YOU:   Understand these instructions.  Will watch your condition.  Will get help right away if you are not doing well or get worse. Document Released: 09/29/2006 Document Revised: 08/11/2011 Document Reviewed: 11/30/2006 [facility] Patient Information 2014 [place], [place].   ________________________________________________________________________

## 2016-10-16 ENCOUNTER — Encounter (HOSPITAL_COMMUNITY): Payer: Self-pay

## 2016-10-16 ENCOUNTER — Encounter (HOSPITAL_COMMUNITY)
Admission: RE | Admit: 2016-10-16 | Discharge: 2016-10-16 | Disposition: A | Discharge status: Home / Self Care | Payer: PRIVATE HEALTH INSURANCE | Source: Ambulatory Visit | Attending: Orthopaedic Surgery | Admitting: Orthopaedic Surgery

## 2016-10-16 DIAGNOSIS — Z01812 Encounter for preprocedural laboratory examination: Secondary | ICD-10-CM | POA: Insufficient documentation

## 2016-10-16 DIAGNOSIS — M1612 Unilateral primary osteoarthritis, left hip: Secondary | ICD-10-CM | POA: Insufficient documentation

## 2016-10-16 HISTORY — DX: Cardiac arrhythmia, unspecified: I49.9

## 2016-10-16 LAB — CBC
HEMATOCRIT: 38.7 % (ref 36.0–46.0)
Hemoglobin: 12.8 g/dL (ref 12.0–15.0)
MCH: 29.2 pg (ref 26.0–34.0)
MCHC: 33.1 g/dL (ref 30.0–36.0)
MCV: 88.2 fL (ref 78.0–100.0)
Platelets: 264 10*3/uL (ref 150–400)
RBC: 4.39 MIL/uL (ref 3.87–5.11)
RDW: 13.3 % (ref 11.5–15.5)
WBC: 6.1 10*3/uL (ref 4.0–10.5)

## 2016-10-16 LAB — ABO/RH: ABO/RH(D): O POS

## 2016-10-16 LAB — BASIC METABOLIC PANEL
ANION GAP: 7 (ref 5–15)
BUN: 19 mg/dL (ref 6–20)
CALCIUM: 9.6 mg/dL (ref 8.9–10.3)
CO2: 30 mmol/L (ref 22–32)
CREATININE: 1.05 mg/dL — AB (ref 0.44–1.00)
Chloride: 103 mmol/L (ref 101–111)
GFR, EST NON AFRICAN AMERICAN: 57 mL/min — AB (ref >60)
Glucose, Bld: 103 mg/dL — ABNORMAL HIGH (ref 65–99)
Potassium: 4 mmol/L (ref 3.5–5.1)
SODIUM: 140 mmol/L (ref 135–145)

## 2016-10-16 LAB — SURGICAL PCR SCREEN
MRSA, PCR: NEGATIVE
Staphylococcus aureus: NEGATIVE

## 2016-10-16 NOTE — Progress Notes (Signed)
Stress 06/13/15  05/19/15 echo ekg 10/26/15 SR

## 2016-10-22 ENCOUNTER — Telehealth (INDEPENDENT_AMBULATORY_CARE_PROVIDER_SITE_OTHER): Payer: Self-pay | Admitting: Orthopaedic Surgery

## 2016-10-22 ENCOUNTER — Encounter (INDEPENDENT_AMBULATORY_CARE_PROVIDER_SITE_OTHER): Payer: Self-pay

## 2016-10-22 NOTE — Telephone Encounter (Signed)
Mailed to patient per her request.

## 2016-10-22 NOTE — Telephone Encounter (Signed)
PT ASKED IF SHE COULD COME PICK UP A COPY OF HER [place]OW NOTE FOR HERSELF PLEASE. SHE STATED HER EMPLOYER GOT A COPY BUT SHE DID NOT.  [phone] [id number] [phone]

## 2016-10-24 ENCOUNTER — Inpatient Hospital Stay (HOSPITAL_COMMUNITY)
Admission: RE | Admit: 2016-10-24 | Discharge: 2016-10-26 | DRG: 470 | Disposition: A | Discharge status: Home Health | Payer: PRIVATE HEALTH INSURANCE | Source: Ambulatory Visit | Attending: Orthopaedic Surgery | Admitting: Orthopaedic Surgery

## 2016-10-24 ENCOUNTER — Inpatient Hospital Stay (HOSPITAL_COMMUNITY): Payer: PRIVATE HEALTH INSURANCE

## 2016-10-24 ENCOUNTER — Encounter (HOSPITAL_COMMUNITY): Payer: Self-pay | Admitting: General Practice

## 2016-10-24 ENCOUNTER — Inpatient Hospital Stay (HOSPITAL_COMMUNITY): Payer: PRIVATE HEALTH INSURANCE | Admitting: Certified Registered Nurse Anesthetist

## 2016-10-24 ENCOUNTER — Encounter (HOSPITAL_COMMUNITY)
Admission: RE | Disposition: A | Discharge status: Home Health | Payer: Self-pay | Source: Ambulatory Visit | Attending: Orthopaedic Surgery

## 2016-10-24 DIAGNOSIS — Z880 Allergy status to penicillin: Secondary | ICD-10-CM

## 2016-10-24 DIAGNOSIS — M25552 Pain in left hip: Secondary | ICD-10-CM | POA: Diagnosis not present

## 2016-10-24 DIAGNOSIS — Q899 Congenital malformation, unspecified: Secondary | ICD-10-CM | POA: Diagnosis not present

## 2016-10-24 DIAGNOSIS — R079 Chest pain, unspecified: Secondary | ICD-10-CM | POA: Diagnosis not present

## 2016-10-24 DIAGNOSIS — Z96642 Presence of left artificial hip joint: Secondary | ICD-10-CM | POA: Diagnosis not present

## 2016-10-24 DIAGNOSIS — H918X2 Other specified hearing loss, left ear: Secondary | ICD-10-CM | POA: Diagnosis present

## 2016-10-24 DIAGNOSIS — Z79899 Other long term (current) drug therapy: Secondary | ICD-10-CM

## 2016-10-24 DIAGNOSIS — D62 Acute posthemorrhagic anemia: Secondary | ICD-10-CM | POA: Diagnosis not present

## 2016-10-24 DIAGNOSIS — Z887 Allergy status to serum and vaccine status: Secondary | ICD-10-CM | POA: Diagnosis not present

## 2016-10-24 DIAGNOSIS — M1611 Unilateral primary osteoarthritis, right hip: Secondary | ICD-10-CM

## 2016-10-24 DIAGNOSIS — M1612 Unilateral primary osteoarthritis, left hip: Secondary | ICD-10-CM | POA: Diagnosis present

## 2016-10-24 DIAGNOSIS — I4891 Unspecified atrial fibrillation: Secondary | ICD-10-CM | POA: Diagnosis not present

## 2016-10-24 DIAGNOSIS — Z888 Allergy status to other drugs, medicaments and biological substances status: Secondary | ICD-10-CM

## 2016-10-24 DIAGNOSIS — I480 Paroxysmal atrial fibrillation: Secondary | ICD-10-CM | POA: Diagnosis present

## 2016-10-24 DIAGNOSIS — Z885 Allergy status to narcotic agent status: Secondary | ICD-10-CM

## 2016-10-24 DIAGNOSIS — R6884 Jaw pain: Secondary | ICD-10-CM | POA: Diagnosis not present

## 2016-10-24 DIAGNOSIS — Z883 Allergy status to other anti-infective agents status: Secondary | ICD-10-CM | POA: Diagnosis not present

## 2016-10-24 HISTORY — PX: TOTAL HIP ARTHROPLASTY: SHX124

## 2016-10-24 LAB — BASIC METABOLIC PANEL
ANION GAP: 7 (ref 5–15)
BUN: 12 mg/dL (ref 6–20)
CALCIUM: 8.6 mg/dL — AB (ref 8.9–10.3)
CHLORIDE: 107 mmol/L (ref 101–111)
CO2: 28 mmol/L (ref 22–32)
CREATININE: 0.84 mg/dL (ref 0.44–1.00)
GFR calc Af Amer: >60 mL/min (ref >60)
GFR calc non Af Amer: >60 mL/min (ref >60)
GLUCOSE: 172 mg/dL — AB (ref 65–99)
Potassium: 3.4 mmol/L — ABNORMAL LOW (ref 3.5–5.1)
Sodium: 142 mmol/L (ref 135–145)

## 2016-10-24 LAB — TYPE AND SCREEN
ABO/RH(D): O POS
ANTIBODY SCREEN: NEGATIVE

## 2016-10-24 LAB — TROPONIN I

## 2016-10-24 LAB — MAGNESIUM: Magnesium: 1.9 mg/dL (ref 1.7–2.4)

## 2016-10-24 SURGERY — ARTHROPLASTY, HIP, TOTAL, ANTERIOR APPROACH
Anesthesia: Spinal | Site: Hip | Laterality: Left

## 2016-10-24 MED ORDER — CLINDAMYCIN PHOSPHATE 600 MG/50ML IV SOLN
600.0000 mg | Freq: Four times a day (QID) | INTRAVENOUS | Status: AC
  Administered 2016-10-24 – 2016-10-25 (×2): 600 mg via INTRAVENOUS
  Filled 2016-10-24 (×2): qty 50

## 2016-10-24 MED ORDER — HYDROMORPHONE HCL 1 MG/ML IJ SOLN: 0.5000 mg | INTRAMUSCULAR | Status: DC | PRN

## 2016-10-24 MED ORDER — PROPOFOL 500 MG/50ML IV EMUL
INTRAVENOUS | Status: DC | PRN
  Administered 2016-10-24: 50 ug/kg/min via INTRAVENOUS

## 2016-10-24 MED ORDER — ONDANSETRON HCL 4 MG PO TABS: 4.0000 mg | ORAL_TABLET | Freq: Four times a day (QID) | ORAL | Status: DC | PRN

## 2016-10-24 MED ORDER — OXYCODONE HCL 5 MG PO TABS
5.0000 mg | ORAL_TABLET | ORAL | Status: DC | PRN
  Administered 2016-10-24: 10 mg via ORAL
  Administered 2016-10-25: 5 mg via ORAL
  Filled 2016-10-24: qty 1
  Filled 2016-10-24: qty 2

## 2016-10-24 MED ORDER — ALUM & MAG HYDROXIDE-SIMETH 200-200-20 MG/5ML PO SUSP: 30.0000 mL | ORAL | Status: DC | PRN

## 2016-10-24 MED ORDER — BUPIVACAINE HCL (PF) 0.5 % IJ SOLN
INTRAMUSCULAR | Status: DC | PRN
  Administered 2016-10-24: 3 mL

## 2016-10-24 MED ORDER — CLINDAMYCIN PHOSPHATE 900 MG/50ML IV SOLN
900.0000 mg | INTRAVENOUS | Status: AC
  Administered 2016-10-24: 900 mg via INTRAVENOUS
  Filled 2016-10-24: qty 50

## 2016-10-24 MED ORDER — METOCLOPRAMIDE HCL 5 MG/ML IJ SOLN
5.0000 mg | Freq: Three times a day (TID) | INTRAMUSCULAR | Status: DC | PRN
  Administered 2016-10-24: 16:00:00 10 mg via INTRAVENOUS
  Filled 2016-10-24: qty 2

## 2016-10-24 MED ORDER — LIDOCAINE HCL (PF) 1 % IJ SOLN
INTRAMUSCULAR | Status: DC | PRN
  Administered 2016-10-24: 2 mL

## 2016-10-24 MED ORDER — PROPOFOL 10 MG/ML IV BOLUS
INTRAVENOUS | Status: AC
Start: 2016-10-24 — End: 2016-10-24
  Filled 2016-10-24: qty 20

## 2016-10-24 MED ORDER — DIPHENHYDRAMINE HCL 12.5 MG/5ML PO ELIX: 12.5000 mg | ORAL_SOLUTION | ORAL | Status: DC | PRN

## 2016-10-24 MED ORDER — SODIUM CHLORIDE 0.9 % IV BOLUS (SEPSIS)
500.0000 mL | INTRAVENOUS | Status: AC
  Administered 2016-10-24: 500 mL via INTRAVENOUS

## 2016-10-24 MED ORDER — TRAMADOL HCL 50 MG PO TABS: 100.0000 mg | ORAL_TABLET | Freq: Four times a day (QID) | ORAL | Status: DC | PRN

## 2016-10-24 MED ORDER — ONDANSETRON HCL 4 MG/2ML IJ SOLN
INTRAMUSCULAR | Status: DC | PRN
  Administered 2016-10-24: 4 mg via INTRAVENOUS

## 2016-10-24 MED ORDER — ASPIRIN 81 MG PO CHEW
81.0000 mg | CHEWABLE_TABLET | Freq: Two times a day (BID) | ORAL | Status: DC
  Administered 2016-10-24 – 2016-10-26 (×4): 81 mg via ORAL
  Filled 2016-10-24 (×4): qty 1

## 2016-10-24 MED ORDER — LIDOCAINE 2% (20 MG/ML) 5 ML SYRINGE
INTRAMUSCULAR | Status: AC
  Filled 2016-10-24: qty 5

## 2016-10-24 MED ORDER — PROPOFOL 10 MG/ML IV BOLUS
INTRAVENOUS | Status: AC
  Filled 2016-10-24: qty 60

## 2016-10-24 MED ORDER — FENTANYL CITRATE (PF) 100 MCG/2ML IJ SOLN: 25.0000 ug | INTRAMUSCULAR | Status: DC | PRN

## 2016-10-24 MED ORDER — LIDOCAINE 2% (20 MG/ML) 5 ML SYRINGE
INTRAMUSCULAR | Status: DC | PRN
  Administered 2016-10-24: 20 mg via INTRAVENOUS

## 2016-10-24 MED ORDER — DEXTROSE 5 % IV SOLN
500.0000 mg | Freq: Four times a day (QID) | INTRAVENOUS | Status: DC | PRN
  Administered 2016-10-24: 500 mg via INTRAVENOUS
  Filled 2016-10-24: qty 550

## 2016-10-24 MED ORDER — ACETAMINOPHEN 650 MG RE SUPP: 650.0000 mg | Freq: Four times a day (QID) | RECTAL | Status: DC | PRN

## 2016-10-24 MED ORDER — SODIUM CHLORIDE 0.9 % IV SOLN
INTRAVENOUS | Status: DC
  Administered 2016-10-24: 21:00:00 via INTRAVENOUS

## 2016-10-24 MED ORDER — SODIUM CHLORIDE 0.9 % IR SOLN
Status: DC | PRN
  Administered 2016-10-24: 1000 mL

## 2016-10-24 MED ORDER — ONDANSETRON HCL 4 MG/2ML IJ SOLN
4.0000 mg | Freq: Once | INTRAMUSCULAR | Status: DC | PRN
  Filled 2016-10-24: qty 2

## 2016-10-24 MED ORDER — PROPOFOL 10 MG/ML IV BOLUS
INTRAVENOUS | Status: DC | PRN
  Administered 2016-10-24 (×4): 20 mg via INTRAVENOUS

## 2016-10-24 MED ORDER — DILTIAZEM HCL 25 MG/5ML IV SOLN
10.0000 mg | INTRAVENOUS | Status: DC
  Filled 2016-10-24: qty 5

## 2016-10-24 MED ORDER — KETOROLAC TROMETHAMINE 15 MG/ML IJ SOLN
7.5000 mg | Freq: Four times a day (QID) | INTRAMUSCULAR | Status: AC
  Administered 2016-10-24 – 2016-10-25 (×3): 7.5 mg via INTRAVENOUS
  Filled 2016-10-24 (×4): qty 1

## 2016-10-24 MED ORDER — MENTHOL 3 MG MT LOZG
1.0000 | LOZENGE | OROMUCOSAL | Status: DC | PRN
  Filled 2016-10-24: qty 9

## 2016-10-24 MED ORDER — NITROGLYCERIN 0.4 MG SL SUBL
SUBLINGUAL_TABLET | SUBLINGUAL | Status: AC
  Administered 2016-10-24: 0.4 mg
  Filled 2016-10-24: qty 1

## 2016-10-24 MED ORDER — MIDAZOLAM HCL 2 MG/2ML IJ SOLN
INTRAMUSCULAR | Status: AC
  Filled 2016-10-24: qty 2

## 2016-10-24 MED ORDER — METHOCARBAMOL 500 MG PO TABS: 500.0000 mg | ORAL_TABLET | Freq: Four times a day (QID) | ORAL | Status: DC | PRN

## 2016-10-24 MED ORDER — ACETAMINOPHEN 325 MG PO TABS
650.0000 mg | ORAL_TABLET | Freq: Four times a day (QID) | ORAL | Status: DC | PRN
  Administered 2016-10-24 – 2016-10-26 (×5): 650 mg via ORAL
  Filled 2016-10-24 (×5): qty 2

## 2016-10-24 MED ORDER — MIDAZOLAM HCL 5 MG/5ML IJ SOLN
INTRAMUSCULAR | Status: DC | PRN
  Administered 2016-10-24 (×2): 1 mg via INTRAVENOUS

## 2016-10-24 MED ORDER — PHENOL 1.4 % MT LIQD
1.0000 | OROMUCOSAL | Status: DC | PRN
  Filled 2016-10-24: qty 177

## 2016-10-24 MED ORDER — METOCLOPRAMIDE HCL 5 MG PO TABS: 5.0000 mg | ORAL_TABLET | Freq: Three times a day (TID) | ORAL | Status: DC | PRN

## 2016-10-24 MED ORDER — LACTATED RINGERS IV SOLN
INTRAVENOUS | Status: DC
  Administered 2016-10-24 (×3): via INTRAVENOUS

## 2016-10-24 MED ORDER — PROGESTERONE MICRONIZED 100 MG PO CAPS
100.0000 mg | ORAL_CAPSULE | Freq: Every day | ORAL | Status: DC
  Administered 2016-10-24 – 2016-10-25 (×2): 100 mg via ORAL
  Filled 2016-10-24 (×2): qty 1

## 2016-10-24 MED ORDER — BUPIVACAINE HCL (PF) 0.5 % IJ SOLN
INTRAMUSCULAR | Status: AC
  Filled 2016-10-24: qty 30

## 2016-10-24 MED ORDER — CHLORHEXIDINE GLUCONATE 4 % EX LIQD: 60.0000 mL | Freq: Once | CUTANEOUS | Status: DC

## 2016-10-24 MED ORDER — VENLAFAXINE HCL ER 37.5 MG PO CP24
37.5000 mg | ORAL_CAPSULE | Freq: Every day | ORAL | Status: DC
  Administered 2016-10-25 – 2016-10-26 (×2): 37.5 mg via ORAL
  Filled 2016-10-24 (×2): qty 1

## 2016-10-24 MED ORDER — POLYETHYLENE GLYCOL 3350 17 G PO PACK: 17.0000 g | PACK | Freq: Every day | ORAL | Status: DC | PRN

## 2016-10-24 MED ORDER — ONDANSETRON HCL 4 MG/2ML IJ SOLN
4.0000 mg | Freq: Four times a day (QID) | INTRAMUSCULAR | Status: DC | PRN
  Administered 2016-10-25: 4 mg via INTRAVENOUS

## 2016-10-24 MED ORDER — DOCUSATE SODIUM 100 MG PO CAPS
100.0000 mg | ORAL_CAPSULE | Freq: Two times a day (BID) | ORAL | Status: DC
  Administered 2016-10-24 – 2016-10-26 (×4): 100 mg via ORAL
  Filled 2016-10-24 (×4): qty 1

## 2016-10-24 MED ORDER — DEXAMETHASONE SODIUM PHOSPHATE 10 MG/ML IJ SOLN
INTRAMUSCULAR | Status: AC
  Filled 2016-10-24: qty 1

## 2016-10-24 MED ORDER — LIDOCAINE HCL 1 % IJ SOLN
INTRAMUSCULAR | Status: AC
Start: 2016-10-24 — End: 2016-10-24
  Filled 2016-10-24: qty 20

## 2016-10-24 MED ORDER — ONDANSETRON HCL 4 MG/2ML IJ SOLN
INTRAMUSCULAR | Status: AC
  Filled 2016-10-24: qty 2

## 2016-10-24 MED ORDER — STERILE WATER FOR IRRIGATION IR SOLN
Status: DC | PRN
  Administered 2016-10-24: 2000 mL

## 2016-10-24 MED ORDER — PROMETHAZINE HCL 25 MG/ML IJ SOLN
12.5000 mg | Freq: Four times a day (QID) | INTRAMUSCULAR | Status: DC | PRN
  Administered 2016-10-24: 12.5 mg via INTRAVENOUS
  Filled 2016-10-24: qty 1

## 2016-10-24 MED ORDER — TRANEXAMIC ACID 1000 MG/10ML IV SOLN
1000.0000 mg | INTRAVENOUS | Status: AC
  Administered 2016-10-24: 1000 mg via INTRAVENOUS
  Filled 2016-10-24: qty 1100

## 2016-10-24 SURGICAL SUPPLY — 43 items
APL SKNCLS STERI-STRIP NONHPOA (GAUZE/BANDAGES/DRESSINGS) ×1
BAG SPEC THK2 15X12 ZIP CLS (MISCELLANEOUS)
BAG ZIPLOCK 12X15 (MISCELLANEOUS) IMPLANT
BENZOIN TINCTURE PRP APPL 2/3 (GAUZE/BANDAGES/DRESSINGS) ×2 IMPLANT
BLADE 10 SAFETY STRL DISP (BLADE) ×2 IMPLANT
BLADE SAW SGTL 18X1.27X75 (BLADE) ×2 IMPLANT
BLADE SAW SGTL 18X1.27X75MM (BLADE) ×1
CAPT HIP TOTAL 2 ×2 IMPLANT
CELLS DAT CNTRL 66122 CELL SVR (MISCELLANEOUS) ×1 IMPLANT
CHLORAPREP W/TINT 26ML (MISCELLANEOUS) ×2 IMPLANT
CLOSURE WOUND 1/2 X4 (GAUZE/BANDAGES/DRESSINGS) ×1
COVER PERINEAL POST (MISCELLANEOUS) ×3 IMPLANT
COVER SURGICAL LIGHT HANDLE (MISCELLANEOUS) ×3 IMPLANT
DRAPE STERI IOBAN 125X83 (DRAPES) ×3 IMPLANT
DRAPE U-SHAPE 47X51 STRL (DRAPES) ×6 IMPLANT
DRESSING AQUACEL AG SP 3.5X10 (GAUZE/BANDAGES/DRESSINGS) IMPLANT
DRSG AQUACEL AG ADV 3.5X10 (GAUZE/BANDAGES/DRESSINGS) ×3 IMPLANT
DRSG AQUACEL AG SP 3.5X10 (GAUZE/BANDAGES/DRESSINGS) ×3
DURAPREP 26ML APPLICATOR (WOUND CARE) ×1 IMPLANT
ELECT REM PT RETURN 15FT ADLT (MISCELLANEOUS) ×3 IMPLANT
GAUZE XEROFORM 1X8 LF (GAUZE/BANDAGES/DRESSINGS) IMPLANT
GLOVE BIO SURGEON STRL SZ7.5 (GLOVE) ×3 IMPLANT
GLOVE BIOGEL PI IND STRL 8 (GLOVE) ×2 IMPLANT
GLOVE BIOGEL PI INDICATOR 8 (GLOVE) ×4
GLOVE ECLIPSE 8.0 STRL XLNG CF (GLOVE) ×3 IMPLANT
GOWN STRL REUS W/TWL XL LVL3 (GOWN DISPOSABLE) ×6 IMPLANT
HANDPIECE INTERPULSE COAX TIP (DISPOSABLE) ×3
HOLDER FOLEY CATH W/STRAP (MISCELLANEOUS) ×3 IMPLANT
PACK ANTERIOR HIP CUSTOM (KITS) ×3 IMPLANT
RETRACTOR WND ALEXIS 18 MED (MISCELLANEOUS) ×1 IMPLANT
RTRCTR WOUND ALEXIS 18CM MED (MISCELLANEOUS) ×3
SET HNDPC FAN SPRY TIP SCT (DISPOSABLE) ×1 IMPLANT
STAPLER VISISTAT 35W (STAPLE) IMPLANT
STRIP CLOSURE SKIN 1/2X4 (GAUZE/BANDAGES/DRESSINGS) ×1 IMPLANT
SUT ETHIBOND NAB CT1 #1 30IN (SUTURE) ×3 IMPLANT
SUT MNCRL AB 4-0 PS2 18 (SUTURE) ×2 IMPLANT
SUT VIC AB 0 CT1 36 (SUTURE) ×3 IMPLANT
SUT VIC AB 1 CT1 36 (SUTURE) ×3 IMPLANT
SUT VIC AB 2-0 CT1 27 (SUTURE) ×6
SUT VIC AB 2-0 CT1 TAPERPNT 27 (SUTURE) ×2 IMPLANT
TRAY FOLEY CATH 14FRSI W/METER (CATHETERS) ×2 IMPLANT
TRAY FOLEY W/METER SILVER 16FR (SET/KITS/TRAYS/PACK) ×1 IMPLANT
YANKAUER SUCT BULB TIP 10FT TU (MISCELLANEOUS) ×3 IMPLANT

## 2016-10-24 NOTE — Progress Notes (Signed)
Patient ID: Kristin Bradford, female   DOB: 1956/01/29, 61 y.o.   MRN: [id number]  Patient noted to be in atrial fibrillation with RVR for about an hour this evening.  She has a history of paroxysmal atrial fibrillation but had been in NSR during this admission until tonight.  She went back into NSR spontaneously.  Discussed with nurse, hospitalist at bedside currently.  They will call us back if needed.   Loralie Champagne 10/24/2016 10:20 PM .

## 2016-10-24 NOTE — Progress Notes (Signed)
EKG comp;eted showing A-fib with RVR.  Dr. Sharol Given and RRN notified.

## 2016-10-24 NOTE — Brief Op Note (Signed)
10/24/2016  11:25 AM  PATIENT:  Kristin Bradford  61 y.o. female  PRE-OPERATIVE DIAGNOSIS:  left hip osteoarthritis with labral tear  POST-OPERATIVE DIAGNOSIS:  left hip osteoarthritis with labral tear  PROCEDURE:  Procedure(s): LEFT TOTAL HIP ARTHROPLASTY ANTERIOR APPROACH (Left)  SURGEON:  Surgeon(s) and Role:    Mcarthur Rossetti, MD - Primary  PHYSICIAN ASSISTANT: Benita Stabile, PA-C  ANESTHESIA:   spinal  EBL:  Total I/O In: 1000 [I.V.:1000] Out: 500 [Urine:100; Blood:400]  COUNTS:  YES  DICTATION: .Other Dictation: Dictation Number [id number]  PLAN OF CARE: Admit to inpatient   PATIENT DISPOSITION:  PACU - hemodynamically stable.   Delay start of Pharmacological VTE agent (>24hrs) due to surgical blood loss or risk of bleeding: no

## 2016-10-24 NOTE — Transfer of Care (Signed)
Immediate Anesthesia Transfer of Care Note  Patient: Kristin Bradford  Procedure(s) Performed: Procedure(s): LEFT TOTAL HIP ARTHROPLASTY ANTERIOR APPROACH (Left)  Patient Location: PACU  Anesthesia Type:MAC and Spinal  Level of Consciousness:  sedated, patient cooperative and responds to stimulation  Airway & Oxygen Therapy:Patient Spontanous Breathing and Patient connected to face mask oxgen  Post-op Assessment:  Report given to PACU RN and Post -op Vital signs reviewed and stable  Post vital signs:  Reviewed and stable  Last Vitals:  Vitals:   10/24/16 0757  BP: 101/68  Pulse: 80  Resp: 16  Temp: 37 C    Complications: No apparent anesthesia complications

## 2016-10-24 NOTE — Clinical Social Work Note (Signed)
Clinical Social Work Assessment  Patient Details  Name: Kristin Bradford MRN: [id number] Date of Birth: 1955/10/03  Date of referral:  10/24/16               Reason for consult:  Facility Placement                Permission sought to share information with:  Facility Sport and exercise psychologist, Family Supports Permission granted to share information::  No  Name::        Agency::     Relationship::     Contact Information:     Housing/Transportation Living arrangements for the past 2 months:  Single Family Home Source of Information:  Patient Patient Interpreter Needed:  None Criminal Activity/Legal Involvement Pertinent to Current Situation/Hospitalization:    Significant Relationships:  Spouse, Adult Children Lives with:  Spouse Do you feel safe going back to the place where you live?    Need for family participation in patient care:  No (Coment)  Care giving concerns:  None listed by pt/family   Social Worker assessment / plan:  CSW met with pt and confirmed pt's plan to be discharged directrly home to live at discharge.  CSW provided active listening and validated pt's concerns that no referrals to SNF be sent at this time.   CSW will NOT complete FL-2 and send referrals out to SNF facilities via the hub, pt DID NOT request.  Pt has been living independently with her family, prior to being admitted to [facility]  Employment status:   (Unknown) Insurance information:   Environmental education officer) PT Recommendations:  Not assessed at this time Information / Referral to community resources:     Patient/Family's Response to care:  Patient alert and oriented.  Patient agreeable to plan.  Pt's husband at [phone] was  supportive and strongly involved in pt.'s care.  Pt pleasant and appreciated CSW intervention.    Patient/Family's Understanding of and Emotional Response to Diagnosis, Current Treatment, and Prognosis:  Still assessing  Emotional Assessment Appearance:  Appears stated  age Attitude/Demeanor/Rapport:    Affect (typically observed):  Accepting, Appropriate, Adaptable, Pleasant Orientation:  Oriented to Self, Oriented to Place, Oriented to  Time, Oriented to Situation Alcohol / Substance use:    Psych involvement (Current and /or in the community):     Discharge Needs  Concerns to be addressed:  No discharge needs identified Readmission within the last 30 days:  No Current discharge risk:  None Barriers to Discharge:  No Barriers Identified   Kristin Bradford, LCSWA 10/24/2016, 7:21 PM

## 2016-10-24 NOTE — Progress Notes (Signed)
Patient's HR noted to be in the 110's- 140's with a high of 150.  HRR per auscultation.  Dr. Sharol Given notified and orders received for EKG and to call back in one hour if HR is not trending down. The patient denies any chest/arm pain/pressure.  Denies feeling of heart racing.

## 2016-10-24 NOTE — Significant Event (Signed)
Rapid Response Event Note  Overview: Time Called: 2056 Arrival Time: 2103 Event Type: Cardiac  Initial Focused Assessment:   Upon entering pt room, pt was A/O x4 .  Diaphoretic.  Denies SOB. Pt on 2 l [place].  VS (HR) 152, sats 100%, B/P 105/74. After assessing the pt she c/o CP and jaw pain rated 3/10. Pt HR increased to 150's/160's.    Interventions:  Pt was given nitro 0.4mg  SL x1 per protocol.  Less than 77min later pt was free of CP and jaw pain.  At 2139 pt converted to SR.  EKG done to confirm.  Pt RN informed surgery as well of the events.  Pt was seen by [facility] MD whom placed orders for pt care.  Orders initiated.  Pt received 500cc bolus NS IV x 1.  Pt family at bedside.  Pt VS HR 105, BP 104/59, sats 99%, R/R 19.    Plan of Care (if not transferred):  Pt transferred to 1435.  Pt RN received report.  VS, HR 80, 100% on 2 L [place], R/R 16, B/P 96/61.  Pt remains in SR on monitor.  Pt denies CP.  Family at bedside.    Event Summary:   at 2314 RRT monitor removed from Pt.  Pt is on telemetry.  Pt RN informed to call if needed. Pt resting.       at          Dyann Ruddle

## 2016-10-24 NOTE — Anesthesia Procedure Notes (Signed)
Spinal  Patient location during procedure: OR Start time: 10/24/2016 9:47 AM End time: 10/24/2016 9:52 AM Staffing Anesthesiologist: Adele Barthel P Resident/CRNA: Darlys Gales R Performed: resident/CRNA  Preanesthetic Checklist Completed: patient identified, site marked, surgical consent, pre-op evaluation, timeout performed, IV checked, risks and benefits discussed and monitors and equipment checked Spinal Block Patient position: sitting Prep: ChloraPrep Patient monitoring: heart rate, cardiac monitor, continuous pulse ox and blood pressure Approach: midline Location: L2-3 Needle Needle type: Pencan  Needle gauge: 24 G Needle length: 9 cm Needle insertion depth: 7 cm Assessment Sensory level: T6

## 2016-10-24 NOTE — H&P (Signed)
TOTAL HIP ADMISSION H&P  Patient is admitted for left total hip arthroplasty.  Subjective:  Chief Complaint: left hip pain  HPI: Kristin Bradford, 61 y.o. female, has a history of pain and functional disability in the left hip(s) due to trauma and patient has failed non-surgical conservative treatments for greater than 12 weeks to include NSAID's and/or analgesics, corticosteriod injections, flexibility and strengthening excercises, use of assistive devices and activity modification.  Onset of symptoms was abrupt starting 1 years ago with rapidlly worsening course since that time.The patient noted no past surgery on the left hip(s).  Patient currently rates pain in the left hip at 10 out of 10 with activity. Patient has worsening of pain with activity and weight bearing, pain that interfers with activities of daily living and pain with passive range of motion. Patient has evidence of anterior and superior labral tears by imaging studies. This condition presents safety issues increasing the risk of falls.  There is no current active infection.  Patient Active Problem List   Diagnosis Date Noted  . Unilateral primary osteoarthritis, left hip 10/06/2016  . Faintness   . Syncope 05/21/2015  . Nausea and vomiting 05/21/2015  . Diarrhea 05/21/2015  . Gastroenteritis 05/21/2015  . IC (interstitial cystitis)   . Atrial fibrillation, new onset ([place]) 05/18/2015  . Atrial fibrillation ([place]) 05/18/2015  . Hypothyroidism 05/18/2015  . Interstitial cystitis 05/18/2015  . 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 A-fib ([place])   . Acquired deafness of left ear    S/P  RESECTION ACOUSTIC NEUROMA  2004  . Acquired facial asymmetry    post surgery  . Dysrhythmia    a fib after surgery  . GERD (gastroesophageal reflux disease)   . Gestational diabetes mellitus    gestastional  . Headache    "weekly" (05/22/2015)  . 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 Hyperlipidemia   . Hypothyroidism    hx of no meds now  . IC (interstitial cystitis)   . Migraine    "q several months" (05/22/2015)  . PONV (postoperative nausea and vomiting)    atrial fib  . Syncope     Past Surgical History:  Procedure Laterality Date  . ACOUSTIC NEUROMA RESECTION Left 2004  . BRAIN SURGERY    . CATARACT EXTRACTION W/ INTRAOCULAR LENS IMPLANT Right 01/2014  . COLONOSCOPY  11/2014  . CYSTO WITH HYDRODISTENSION N/A 05/18/2015   Procedure: CYSTOSCOPY/HYDRODISTENSION;  Surgeon: Carolan Clines, MD;  Location: [facility];  Service: Urology;  Laterality: N/A;  . CYSTO/  HYDRODISTENTION/  INSTILLATION THERAPY  x4  last one 05-16-2010   since 1990's  . ESOPHAGOGASTRODUODENOSCOPY  11/2014  . HEMANGIOMA EXCISION Left 2005    FACIAL ANGIOMA REMOVAL / MASTOIDECTOMY  . HEMORRHOID BANDING  05/2015  . MASTOIDECTOMY Left 2005   LEFT FACIAL ANGIOMA REMOVAL /  LEFT MASTOIDECTOMY [Other]  . [place]   "all 4"    No prescriptions prior to admission.   Allergies  Allergen Reactions  . Betadine [Povidone Iodine] Rash  . Codeine Nausea And Vomiting  . Fentanyl Nausea And Vomiting  . Other     Steroid injections make her hair fall out  . Penicillins Itching    Has patient had a PCN reaction causing immediate rash, facial/tongue/throat swelling, SOB or lightheadedness with hypotension: Yes Has patient had a PCN reaction causing severe rash involving mucus membranes  or skin necrosis: No Has patient had a PCN reaction that required hospitalization No Has patient had a PCN reaction occurring within the last 10 years: No If all of the above answers are "NO", then may proceed with Cephalosporin use.   . Tetanus Toxoids Nausea And Vomiting and Other (See Comments)    High fever    Social History  Substance Use Topics  . Smoking status: Never Smoker  . Smokeless tobacco: Never Used  . Alcohol use 0.6  oz/week    1 Glasses of wine per week     Comment: rare    Family History  Problem Relation Age of Onset  . Adopted: Yes     Review of Systems  Musculoskeletal: Positive for joint pain.  All other systems reviewed and are negative.   Objective:  Physical Exam  Constitutional: She is oriented to person, place, and time. She appears well-developed and well-nourished.  HENT:  Head: Normocephalic and atraumatic.  Eyes: EOM are normal. Pupils are equal, round, and reactive to light.  Neck: Normal range of motion.  Cardiovascular: Normal rate and regular rhythm.   Respiratory: Effort normal and breath sounds normal.  GI: Soft. Bowel sounds are normal.  Musculoskeletal:       Left hip: She exhibits decreased range of motion, decreased strength, tenderness and bony tenderness.  Neurological: She is alert and oriented to person, place, and time.  Skin: Skin is warm and dry.  Psychiatric: She has a normal mood and affect.    Vital signs in last 24 hours:    Labs:   Estimated body mass index is 25.1 kg/m as calculated from the following:   Height as of 10/16/16: 5\' 11"  (1.803 m).   Weight as of 10/16/16: 180 lb (81.6 kg).   Imaging Review MRI shows significant labral tearing of the left hip and only mild arthritic changes. The bone quality appears to be excellent for age and reported activity level.  Assessment/Plan:  Mild arthritis, left hip(s) with significant mensical tears  The patient history, physical examination, clinical judgement of the provider and imaging studies are consistent with traumatic arthritis of the left hip(s) and total hip arthroplasty is deemed medically necessary. The treatment options including medical management, injection therapy, arthroscopy and arthroplasty were discussed at length. The risks and benefits of total hip arthroplasty were presented and reviewed. The risks due to aseptic loosening, infection, stiffness, dislocation/subluxation,   thromboembolic complications and other imponderables were discussed.  The patient acknowledged the explanation, agreed to proceed with the plan and consent was signed. Patient is being admitted for inpatient treatment for surgery, pain control, PT, OT, prophylactic antibiotics, VTE prophylaxis, progressive ambulation and ADL's and discharge planning.The patient is planning to be discharged home with home health services

## 2016-10-24 NOTE — Anesthesia Preprocedure Evaluation (Addendum)
Anesthesia Evaluation  Patient identified by MRN, date of birth, ID band Patient awake    Reviewed: Allergy & Precautions, NPO status , Patient's Chart, lab work & pertinent test results  History of Anesthesia Complications (+) PONV  Airway Mallampati: II  TM Distance: >3 FB Neck ROM: Full    Dental no notable dental hx.    Pulmonary neg pulmonary ROS,    Pulmonary exam normal breath sounds clear to auscultation       Cardiovascular negative cardio ROS Normal cardiovascular exam Rhythm:Regular Rate:Normal  ECG: NSR, rate 63 ECHO:  - Left ventricle: The cavity size was normal. Wall thickness was   normal. Systolic function was normal. The estimated ejection   fraction was in the range of 60% to 65%. Wall motion was normal;   there were no regional wall motion abnormalities. Left   ventricular diastolic function parameters were normal for the   patient&'s age. - Right ventricle: The cavity size was mildly dilated. Wall   thickness was normal.    Neuro/Psych PSYCHIATRIC DISORDERS Anxiety Depression negative neurological ROS     GI/Hepatic negative GI ROS, Neg liver ROS,   Endo/Other  negative endocrine ROS  Renal/GU negative Renal ROS  negative genitourinary   Musculoskeletal  (+) Arthritis , Osteoarthritis,    Abdominal   Peds negative pediatric ROS (+)  Hematology negative hematology ROS (+)   Anesthesia Other Findings   Reproductive/Obstetrics negative OB ROS                            Anesthesia Physical  Anesthesia Plan  ASA: II  Anesthesia Plan: Spinal   Post-op Pain Management:    Induction: Intravenous  Airway Management Planned:   Additional Equipment:   Intra-op Plan:   Post-operative Plan:   Informed Consent: I have reviewed the patients History and Physical, chart, labs and discussed the procedure including the risks, benefits and alternatives for the  proposed anesthesia with the patient or authorized representative who has indicated his/her understanding and acceptance.   Dental advisory given  Plan Discussed with: CRNA and Surgeon  Anesthesia Plan Comments:        Anesthesia Quick Evaluation

## 2016-10-24 NOTE — Progress Notes (Signed)
PT Cancellation Note  Patient Details Name: Kristin Bradford MRN: [id number] DOB: [birth year]   Cancelled Treatment:     PT deferred this date 2* pt c/o nausea and pain.  Will follow in am.   Kindred Heying 10/24/2016, 4:07 PM

## 2016-10-24 NOTE — Consult Note (Signed)
[facility] Medical Consultation  Kristin Bradford JEH:[id number] DOB: [birth year] DOA: 10/24/2016 PCP: Mayra Neer, MD   Requesting physician: Dr. Sharol Given Date of consultation: 10/24/2016 Reason for consultation: Atrial fibrillation with RVR  Impression/Recommendations Principal Problem:   Unilateral primary osteoarthritis, left hip Active Problems:   Atrial fibrillation ([place])   Status post total replacement of left hip   Chest pain   Atrial fibrillation with Chest pain: Acute, but now resolved. Patient spontaneously went back into normal sinus rhythm after being given 1 sublingual nitroglycerin tablet for chest pain complaints. Chadsvasc score =1. EKG showing no significant ST-T wave changes. Recent surgical procedure as a possible cause of cardiac ischemia. - Transfer to a telemetry bed overnight - Trend cardiac enzymes - Check electrolytes with BMP and magnesium - Replace any electrolyte abnormalities - Check TSH  - Would consider placing patient on aspirin - May warrant further investigation into anesthesia medications used and can likely provoke arrhythmias - F/u with cardiology in a.m. for further recommendations, if needed   I will followup again tomorrow. Please contact me if I can be of assistance in the meanwhile. Thank you for this consultation.  Chief Complaint: Left hip pain  HPI:  Kristin Bradford is a 61 year old female with pmh hypothyroidism, HLD, A. fib post surgery not on anticoagulation, OA of the left hip, and GERD; who presented for left hip replacement after failed conservative treatments for greater than 12 weeks. Patient underwent total hip replacement this morning without noted complication. However, around 8:40 PM patient developed palpitations with heart rates documented 110 -150s. EKG revealed atrial fibrillation with RVR. Patient reported complaints of chest pain and entire jaw pain that she rated as a 2-3/10 on the pain scale. Associated symptoms included  diaphoresis. Patient denies any lightheadedness, shortness of breath, fever, chills, nausea, or vomiting. Rapid response was called and while at bedside gave the patient 10.4 mg sublingual nitroglycerin tablet at 9:28pm. At 9:31 patient denies any chest pain complaints, and return to normal sinus rhythm at around 9:39 PM. Patient admits to having similar symptoms like this with previous surgery 1 year ago for which she was on anticoagulation of Eliquis, and was placed on a Holter monitor for some time. She notes after being evaluated by Dr. Crissie Sickles of cardiology in 12/2015, she was recommended to stop anticoagulation. She had had no recurrence of atrial fibrillation and her chadsvasc score was  1. Patient suspects that it was something done with anesthesia that likely provoked rhythm.   Review of Systems:  As per history of present illness, otherwise a complete 10 point review of systems was completed and negative.   Past Medical History:  Diagnosis Date  . A-fib ([place])   . Acquired deafness of left ear    S/P  RESECTION ACOUSTIC NEUROMA  2004  . Acquired facial asymmetry    post surgery  . Dysrhythmia    a fib after surgery  . GERD (gastroesophageal reflux disease)   . Gestational diabetes mellitus    gestastional  . Headache    "weekly" (05/22/2015)  . 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 Hyperlipidemia   . Hypothyroidism    hx of no meds now  . IC (interstitial cystitis)   . Migraine    "q several months" (05/22/2015)  . PONV (postoperative nausea and vomiting)    atrial fib  . Syncope    Past Surgical History:  Procedure Laterality Date  . ACOUSTIC NEUROMA  RESECTION Left 2004  . BRAIN SURGERY    . CATARACT EXTRACTION W/ INTRAOCULAR LENS IMPLANT Right 01/2014  . COLONOSCOPY  11/2014  . CYSTO WITH HYDRODISTENSION N/A 05/18/2015   Procedure: CYSTOSCOPY/HYDRODISTENSION;  Surgeon: Carolan Clines, MD;  Location: [facility];  Service: Urology;  Laterality: N/A;  . CYSTO/  HYDRODISTENTION/  INSTILLATION THERAPY  x4  last one 05-16-2010   since 1990's  . ESOPHAGOGASTRODUODENOSCOPY  11/2014  . HEMANGIOMA EXCISION Left 2005    FACIAL ANGIOMA REMOVAL / MASTOIDECTOMY  . HEMORRHOID BANDING  05/2015  . MASTOIDECTOMY Left 2005   LEFT FACIAL ANGIOMA REMOVAL /  LEFT MASTOIDECTOMY [Other]  . [place]   "all 4"   Social History:  reports that she has never smoked. She has never used smokeless tobacco. She reports that she drinks about 0.6 oz of alcohol per week . She reports that she does not use drugs.  Allergies  Allergen Reactions  . Betadine [Povidone Iodine] Rash  . Codeine Nausea And Vomiting  . Fentanyl Nausea And Vomiting  . Other     Steroid injections make her hair fall out  . Penicillins Itching    Has patient had a PCN reaction causing immediate rash, facial/tongue/throat swelling, SOB or lightheadedness with hypotension: Yes Has patient had a PCN reaction causing severe rash involving mucus membranes or skin necrosis: No Has patient had a PCN reaction that required hospitalization No Has patient had a PCN reaction occurring within the last 10 years: No If all of the above answers are "NO", then may proceed with Cephalosporin use.   . Tetanus Toxoids Nausea And Vomiting and Other (See Comments)    High fever   Family History  Problem Relation Age of Onset  . Adopted: Yes    Prior to Admission medications   Medication Sig Start Date End Date Taking? Authorizing Provider  Biotin w/ Vitamins C & E (HAIR/SKIN/NAILS PO) Take 1 tablet by mouth daily.   Yes [provider]  diphenhydramine-acetaminophen (TYLENOL PM) 25-500 MG TABS tablet Take 1 tablet by mouth at bedtime as needed (pain/sleep).   Yes [provider]  polyethylene glycol powder (GLYCOLAX/MIRALAX) powder Take 0.25 Containers by mouth daily.    Yes [provider]  progesterone  (PROMETRIUM) 100 MG capsule Take 100 mg by mouth at bedtime.   Yes [provider]  venlafaxine XR (EFFEXOR-XR) 37.5 MG 24 hr capsule Take 37.5 mg by mouth daily with breakfast.  08/14/16  Yes [provider]  ALPRAZolam (XANAX) 0.5 MG tablet Take 0.25 mg by mouth daily as needed for anxiety.  07/23/16   [provider]   Physical Exam: Blood pressure (!) 103/53, pulse (!) 143, temperature 98.8 F (37.1 C), temperature source Oral, resp. rate 20, height 5\' 11"  (1.803 m), weight 81.6 kg (180 lb), SpO2 100 %. Vitals:   10/24/16 1831 10/24/16 2100  BP: (!) 94/55 (!) 103/53  Pulse: 71 (!) 143  Resp: 20 20  Temp: 98 F (36.7 C) 98.8 F (37.1 C)   General:  EYES: PERRL, lids and conjunctivae normal ENMT: Mucous membranes are moist. Posterior pharynx clear of any exudate or lesions.Normal dentition.  Neck: normal, supple, no masses, no thyromegaly Respiratory: clear to auscultation bilaterally, no wheezing, no crackles. Normal respiratory effort. No accessory muscle use.  Cardiovascular: Regular rate and rhythm, no murmurs / rubs / gallops. No extremity edema. 2+ pedal pulses. No carotid bruits.  Abdomen: mild low abdmenal tenderness, no masses palpated.  No hepatosplenomegaly. Bowel sounds positive.  Musculoskeletal: no clubbing / cyanosis. No joint deformity upper and lower extremities. Good ROM, no contractures. Normal muscle tone.  Skin: no rashes, lesions, ulcers. No induration Neurologic: CN 2-12 grossly intact. Sensation intact, DTR normal. Strength 5/5 in all 4.  Psychiatric: Normal judgment and insight. Alert and oriented x 3. Normal mood.   Labs on Admission:  Basic Metabolic Panel: No results for input(s): NA, K, CL, CO2, GLUCOSE, BUN, CREATININE, CALCIUM, MG, PHOS in the last 168 hours. Liver Function Tests: No results for input(s): AST, ALT, ALKPHOS, BILITOT, PROT, ALBUMIN in the last 168 hours. No results for input(s): LIPASE, AMYLASE in the last 168  hours. No results for input(s): AMMONIA in the last 168 hours. CBC: No results for input(s): WBC, NEUTROABS, HGB, HCT, MCV, PLT in the last 168 hours. Cardiac Enzymes: No results for input(s): CKTOTAL, CKMB, CKMBINDEX, TROPONINI in the last 168 hours. BNP: Invalid input(s): POCBNP CBG: No results for input(s): GLUCAP in the last 168 hours.  Radiological Exams on Admission: Dg Pelvis Portable  Result Date: 10/24/2016 CLINICAL DATA:  Left hip replacement EXAM: PORTABLE PELVIS 1-2 VIEWS COMPARISON:  None. FINDINGS: Left total hip arthroplasty. No acute fracture or dislocation. No hardware failure or complication. Postsurgical changes in the soft tissues around the left hip. IMPRESSION: Interval left total hip arthroplasty. Electronically Signed   By: Kathreen Devoid   On: 10/24/2016 12:24   Dg C-arm 61-120 Min-no Report  Result Date: 10/24/2016 Fluoroscopy was utilized by the requesting physician.  No radiographic interpretation.   Dg Hip Operative Unilat With Pelvis Left  Result Date: 10/24/2016 CLINICAL DATA:  Left hip replacement surgery EXAM: OPERATIVE LEFT HIP (WITH PELVIS IF PERFORMED) 4 VIEWS TECHNIQUE: Fluoroscopic spot image(s) were submitted for interpretation post-operatively. COMPARISON:  None. FINDINGS: left hip arthroplasty components project in expected location. no fracture or dislocation. IMPRESSION: 1. LEFT HIP arthroplasty without apparent complication. Electronically Signed   By: Lucrezia Europe M.D.   On: 10/24/2016 11:20    EKG: Independently reviewed.Initially A. fib with RVR at 143 bpm  Time spent: >45 minutes  [facility] Pager [phone]  If 7PM-7AM, please contact night-coverage [email or web address] Password [facility] 10/24/2016, 9:49 PM

## 2016-10-24 NOTE — Anesthesia Postprocedure Evaluation (Signed)
Anesthesia Post Note  Patient: Kristin Bradford  Procedure(s) Performed: Procedure(s) (LRB): LEFT TOTAL HIP ARTHROPLASTY ANTERIOR APPROACH (Left)  Patient location during evaluation: PACU Anesthesia Type: Spinal Level of consciousness: oriented and awake and alert Pain management: pain level controlled Vital Signs Assessment: post-procedure vital signs reviewed and stable Respiratory status: spontaneous breathing, respiratory function stable and patient connected to nasal cannula oxygen Cardiovascular status: blood pressure returned to baseline and stable Postop Assessment: no headache and no backache Anesthetic complications: no       Last Vitals:  Vitals:   10/24/16 1352 10/24/16 1533  BP: (!) 113/54 (!) 100/54  Pulse: (!) 53 (!) 56  Resp: 20 20  Temp: 37 C 36.9 C    Last Pain:  Vitals:   10/24/16 1540  TempSrc:   PainSc: 8                  Ryan P Ellender

## 2016-10-25 ENCOUNTER — Inpatient Hospital Stay (HOSPITAL_COMMUNITY): Payer: PRIVATE HEALTH INSURANCE

## 2016-10-25 DIAGNOSIS — I4891 Unspecified atrial fibrillation: Secondary | ICD-10-CM

## 2016-10-25 DIAGNOSIS — M25552 Pain in left hip: Secondary | ICD-10-CM

## 2016-10-25 LAB — TSH: TSH: 1.363 u[IU]/mL (ref 0.350–4.500)

## 2016-10-25 LAB — BASIC METABOLIC PANEL
Anion gap: 5 (ref 5–15)
BUN: 12 mg/dL (ref 6–20)
CO2: 29 mmol/L (ref 22–32)
CREATININE: 0.78 mg/dL (ref 0.44–1.00)
Calcium: 8.3 mg/dL — ABNORMAL LOW (ref 8.9–10.3)
Chloride: 109 mmol/L (ref 101–111)
GFR calc Af Amer: >60 mL/min (ref >60)
GFR calc non Af Amer: >60 mL/min (ref >60)
Glucose, Bld: 113 mg/dL — ABNORMAL HIGH (ref 65–99)
Potassium: 3.6 mmol/L (ref 3.5–5.1)
Sodium: 143 mmol/L (ref 135–145)

## 2016-10-25 LAB — CBC
HEMATOCRIT: 30.7 % — AB (ref 36.0–46.0)
HEMOGLOBIN: 10.2 g/dL — AB (ref 12.0–15.0)
MCH: 29.7 pg (ref 26.0–34.0)
MCHC: 33.2 g/dL (ref 30.0–36.0)
MCV: 89.5 fL (ref 78.0–100.0)
Platelets: 200 10*3/uL (ref 150–400)
RBC: 3.43 MIL/uL — ABNORMAL LOW (ref 3.87–5.11)
RDW: 13.4 % (ref 11.5–15.5)
WBC: 6.9 10*3/uL (ref 4.0–10.5)

## 2016-10-25 LAB — ECHOCARDIOGRAM COMPLETE
HEIGHTINCHES: 71 in
Weight: 2880 oz

## 2016-10-25 LAB — TROPONIN I
Troponin I: <0.03 ng/mL (ref <0.03)
Troponin I: <0.03 ng/mL (ref <0.03)

## 2016-10-25 MED ORDER — METHOCARBAMOL 500 MG PO TABS: 500.0000 mg | ORAL_TABLET | Freq: Four times a day (QID) | ORAL | 0 refills | Status: DC | PRN

## 2016-10-25 MED ORDER — ASPIRIN 81 MG PO CHEW: 81.0000 mg | CHEWABLE_TABLET | Freq: Two times a day (BID) | ORAL | 0 refills | Status: DC

## 2016-10-25 MED ORDER — METOPROLOL TARTRATE 25 MG PO TABS
12.5000 mg | ORAL_TABLET | Freq: Two times a day (BID) | ORAL | Status: DC
  Filled 2016-10-25 (×2): qty 1

## 2016-10-25 MED ORDER — ONDANSETRON 4 MG PO TBDP: 4.0000 mg | ORAL_TABLET | Freq: Three times a day (TID) | ORAL | 0 refills | Status: DC | PRN

## 2016-10-25 MED ORDER — OXYCODONE-ACETAMINOPHEN 5-325 MG PO TABS: 1.0000 | ORAL_TABLET | ORAL | 0 refills | Status: DC | PRN

## 2016-10-25 NOTE — Discharge Instructions (Signed)

## 2016-10-25 NOTE — Progress Notes (Signed)
Physical Therapy Treatment Patient Details Name: Kristin Bradford MRN: [id number] DOB: [birth year] Today's Date: 10/25/2016    History of Present Illness 61 yo female s/p L THA-direct anterior 10/24/16. Post-op Afib. Hx of post-op A fib, syncope, acoustic neuroma L ear    PT Comments    Progressing slowly with mobility.    Follow Up Recommendations  Home health PT;Supervision/Assistance - 24 hour     Equipment Recommendations  None recommended by PT    Recommendations for Other Services       Precautions / Restrictions Precautions Precautions: Fall Restrictions Weight Bearing Restrictions: No LLE Weight Bearing: Weight bearing as tolerated    Mobility  Bed Mobility Overal bed mobility: Needs Assistance Bed Mobility: Supine to Sit     Supine to sit: Min assist     General bed mobility comments: Assist for L LE (husband assisted). Incresed time. VCs safety, technique.   Transfers Overall transfer level: Needs assistance Equipment used: Rolling walker (2 wheeled) Transfers: Sit to/from Stand Sit to Stand: Min assist         General transfer comment: VCs safety, technique, hand/LE placement.   Ambulation/Gait Ambulation/Gait assistance: Min guard Ambulation Distance (Feet): 50 Feet Assistive device: Rolling walker (2 wheeled) Gait Pattern/deviations: Step-to pattern     General Gait Details: VCs safety, sequence, step length. Slow gait speed. Decreased distance due to pt feeling need to return to room to toilet.    Stairs            Wheelchair Mobility    Modified Rankin (Stroke Patients Only)       Balance                                            Cognition Arousal/Alertness: Awake/alert Behavior During Therapy: WFL for tasks assessed/performed Overall Cognitive Status: Within Functional Limits for tasks assessed                                        Exercises Total Joint Exercises Ankle  Circles/Pumps: AROM;Both;10 reps;Supine Quad Sets: AROM;Both;10 reps;Supine Heel Slides: AAROM;Left;10 reps;Supine Hip ABduction/ADduction: AAROM;Left;10 reps;Supine    General Comments        Pertinent Vitals/Pain Pain Assessment: 0-10 Pain Score: 3  Pain Location: L antero-lateral thigh Pain Descriptors / Indicators: Sore;Sharp Pain Intervention(s): Monitored during session    Home Living Family/patient expects to be discharged to:: Private residence Living Arrangements: Spouse/significant other Available Help at Discharge: Family Type of Home: House Home Access: Stairs to enter Entrance Stairs-Rails: Right;Left;Can reach both Home Layout: Bed/bath upstairs;Two level Home Equipment: Walker - 2 wheels;Cane - single point;Bedside commode      Prior Function Level of Independence: Independent          PT Goals (current goals can now be found in the care plan section) Acute Rehab PT Goals Patient Stated Goal: return to work in 3 weeks.  PT Goal Formulation: With patient Time For Goal Achievement: 11/08/16 Potential to Achieve Goals: Good Progress towards PT goals: Progressing toward goals    Frequency    7X/week      PT Plan Current plan remains appropriate    Co-evaluation              AM-PAC PT "6 Clicks" Daily Activity  Outcome Measure  Difficulty turning over in bed (including adjusting bedclothes, sheets and blankets)?: A Little Difficulty moving from lying on back to sitting on the side of the bed? : A Little Difficulty sitting down on and standing up from a chair with arms (e.g., wheelchair, bedside commode, etc,.)?: A Little Help needed moving to and from a bed to chair (including a wheelchair)?: A Little Help needed walking in hospital room?: A Little Help needed climbing 3-5 steps with a railing? : A Little 6 Click Score: 18    End of Session Equipment Utilized During Treatment: Gait belt Activity Tolerance: Patient tolerated treatment  well Patient left: with call bell/phone within reach;with family/visitor present (in bathroom-pt to call out-made NT aware)   PT Visit Diagnosis: Muscle weakness (generalized) (M62.81);Difficulty in walking, not elsewhere classified (R26.2)     Time: 1188-6773 PT Time Calculation (min) (ACUTE ONLY): 11 min  Charges:  $Gait Training: 8-22 mins                    G Codes:          Weston Anna, MPT Pager: [phone]

## 2016-10-25 NOTE — Progress Notes (Signed)
OT Cancellation Note  Patient Details Name: Kristin Bradford MRN: [id number] DOB: [birth year]   Cancelled Treatment:    Reason Eval/Treat Not Completed: Medical issues which prohibited therapy -- Patient had rapid response late last night due to cardiac issues and was transferred to telemetry. OT will follow up next date for initiation of evaluation/treatment. Thank you.  Kassius Battiste A Markel Mergenthaler 10/25/2016, 10:41 AM

## 2016-10-25 NOTE — Progress Notes (Signed)
*  PRELIMINARY RESULTS* Echocardiogram 2D Echocardiogram has been performed.  Leavy Cella 10/25/2016, 1:34 PM

## 2016-10-25 NOTE — Evaluation (Signed)
Physical Therapy Evaluation Patient Details Name: Kristin Bradford MRN: [id number] DOB: December 27, 1955 Today's Date: 10/25/2016   History of Present Illness  61 yo female s/p L THA-direct anterior 10/24/16. Post-op Afib. Hx of post-op A fib, syncope, acoustic neuroma L ear  Clinical Impression  On eval, pt required Min assist for mobility. She walked ~75 feet with a RW. Pain rated 3/10 with activity. Anticipate pt will progress well. Will follow and progress activity as tolerated.     Follow Up Recommendations Home health PT;Supervision/Assistance - 24 hour    Equipment Recommendations  None recommended by PT    Recommendations for Other Services       Precautions / Restrictions Precautions Precautions: Fall Restrictions Weight Bearing Restrictions: No LLE Weight Bearing: Weight bearing as tolerated      Mobility  Bed Mobility Overal bed mobility: Needs Assistance Bed Mobility: Supine to Sit     Supine to sit: Min assist;HOB elevated     General bed mobility comments: Assist for L LE. Incresed time. VCs safety, technique.   Transfers Overall transfer level: Needs assistance Equipment used: Rolling walker (2 wheeled) Transfers: Sit to/from Stand Sit to Stand: Min assist;From elevated surface         General transfer comment: Practiced from a high bed (pt has high bed at home). VCs safety, technique, hand/LE placement.   Ambulation/Gait Ambulation/Gait assistance: Min guard Ambulation Distance (Feet): 75 Feet Assistive device: Rolling walker (2 wheeled) Gait Pattern/deviations: Step-to pattern;Decreased step length - left;Antalgic     General Gait Details: Intermittent sharp pain anterolateral thigh. VCs safety, sequence, step length. Slow gait speed. Pt tolerated distance well.   Stairs            Wheelchair Mobility    Modified Rankin (Stroke Patients Only)       Balance                                             Pertinent  Vitals/Pain Pain Assessment: 0-10 Pain Score: 3  Pain Location: L antero-lateral thigh Pain Descriptors / Indicators: Sore;Sharp Pain Intervention(s): Monitored during session;Ice applied;Repositioned    Home Living Family/patient expects to be discharged to:: Private residence Living Arrangements: Spouse/significant other Available Help at Discharge: Family Type of Home: House Home Access: Stairs to enter Entrance Stairs-Rails: Right;Left;Can reach both Entrance Stairs-Number of Steps: 3 Home Layout: Bed/bath upstairs;Two level Home Equipment: Walker - 2 wheels;Cane - single point;Bedside commode      Prior Function Level of Independence: Independent               Hand Dominance        Extremity/Trunk Assessment   Upper Extremity Assessment Upper Extremity Assessment: Overall WFL for tasks assessed    Lower Extremity Assessment Lower Extremity Assessment: Generalized weakness    Cervical / Trunk Assessment Cervical / Trunk Assessment: Normal  Communication   Communication: HOH  Cognition Arousal/Alertness: Awake/alert Behavior During Therapy: WFL for tasks assessed/performed Overall Cognitive Status: Within Functional Limits for tasks assessed                                        General Comments      Exercises Total Joint Exercises Ankle Circles/Pumps: AROM;Both;10 reps;Supine Quad Sets: AROM;Both;10 reps;Supine Heel Slides: AAROM;Left;10 reps;Supine Hip ABduction/ADduction:  AAROM;Left;10 reps;Supine   Assessment/Plan    PT Assessment Patient needs continued PT services  PT Problem List Decreased strength;Decreased mobility;Decreased activity tolerance;Decreased balance;Decreased knowledge of use of DME;Pain;Decreased range of motion       PT Treatment Interventions DME instruction;Gait training;Therapeutic activities;Therapeutic exercise;Patient/family education;Balance training;Functional mobility training    PT Goals  (Current goals can be found in the Care Plan section)  Acute Rehab PT Goals Patient Stated Goal: return to work in 3 weeks.  PT Goal Formulation: With patient Time For Goal Achievement: 11/08/16 Potential to Achieve Goals: Good    Frequency 7X/week   Barriers to discharge        Co-evaluation               AM-PAC PT "6 Clicks" Daily Activity  Outcome Measure Difficulty turning over in bed (including adjusting bedclothes, sheets and blankets)?: A Little Difficulty moving from lying on back to sitting on the side of the bed? : A Little Difficulty sitting down on and standing up from a chair with arms (e.g., wheelchair, bedside commode, etc,.)?: A Little Help needed moving to and from a bed to chair (including a wheelchair)?: A Little Help needed walking in hospital room?: A Little Help needed climbing 3-5 steps with a railing? : A Little 6 Click Score: 18    End of Session Equipment Utilized During Treatment: Gait belt Activity Tolerance: Patient tolerated treatment well Patient left: in chair;with family/visitor present   PT Visit Diagnosis: Muscle weakness (generalized) (M62.81);Difficulty in walking, not elsewhere classified (R26.2)    Time: 8756-4332 PT Time Calculation (min) (ACUTE ONLY): 36 min   Charges:     PT Treatments $Gait Training: 8-22 mins   PT G Codes:          Weston Anna, MPT Pager: [phone]

## 2016-10-25 NOTE — Progress Notes (Signed)
Subjective: 1 Day Post-Op Procedure(s) (LRB): LEFT TOTAL HIP ARTHROPLASTY ANTERIOR APPROACH (Left) Patient reports pain as moderate.  Appreciate [facility] and [facility]iology consults for her afib with RVR.  In sinus now and feels good overall.  Has been doing well with therapy.  Vitals and left hip stable. Acute blood loss anemia from surgery, but tolerating well. Objective: Vital signs in last 24 hours: Temp:  [97.5 F (36.4 C)-99.1 F (37.3 C)] 99.1 F (37.3 C) (05/26 0550) Pulse Rate:  [53-143] 70 (05/26 0550) Resp:  [12-20] 12 (05/26 0550) BP: (92-121)/(46-77) 103/53 (05/26 0550) SpO2:  [100 %] 100 % (05/26 0550)  Intake/Output from previous day: 05/25 0701 - 05/26 0700 In: 3163.8 [I.V.:3063.8; IV Piggyback:100] Out: 3400 [Urine:3000; Blood:400] Intake/Output this shift: No intake/output data recorded.   Recent Labs  10/25/16 0354  HGB 10.2*    Recent Labs  10/25/16 0354  WBC 6.9  RBC 3.43*  HCT 30.7*  PLT 200    Recent Labs  10/24/16 2215 10/25/16 0354  NA 142 143  K 3.4* 3.6  CL 107 109  CO2 28 29  BUN 12 12  CREATININE 0.84 0.78  GLUCOSE 172* 113*  CALCIUM 8.6* 8.3*   No results for input(s): LABPT, INR in the last 72 hours.  Sensation intact distally Intact pulses distally Dorsiflexion/Plantar flexion intact Incision: dressing C/D/I  Assessment/Plan: 1 Day Post-Op Procedure(s) (LRB): LEFT TOTAL HIP ARTHROPLASTY ANTERIOR APPROACH (Left) Up with therapy Plan for discharge tomorrow Discharge home with home health  Mcarthur Rossetti 10/25/2016, 12:32 PM

## 2016-10-25 NOTE — Progress Notes (Addendum)
PROGRESS NOTE    Kristin Bradford  PPJ:[id number] DOB: [birth year] DOA: 10/24/2016 PCP: Kristin Neer, MD   Brief Narrative:  [name] is a 61 year old female with pmh hypothyroidism, HLD, A. fib post surgery not on anticoagulation, OA of the left hip, and GERD; who presented for left hip replacement after failed conservative treatments for greater than 12 weeks. Patient underwent total hip replacement yesterday morning without noted complication. However, around 8:40 PM patient developed palpitations with heart rates documented 110 -150s. EKG revealed atrial fibrillation with RVR. Patient reported complaints of chest pain and entire jaw pain that she rated as a 2-3/10 on the pain scale. Associated symptoms included diaphoresis. Patient denies any lightheadedness, shortness of breath, fever, chills, nausea, or vomiting. Rapid response was called and while at bedside gave the patient 10.4 mg sublingual nitroglycerin tablet at 9:28pm. At 9:31 patient denies any chest pain complaints, and return to normal sinus rhythm at around 9:39 PM.   Patient admits to having similar symptoms like this with previous surgery 1 year ago for which she was on anticoagulation of Eliquis, and was placed on a Holter monitor for some time. She notes after being evaluated by Dr. Crissie Bradford of Cardiology in 12/2015, she was recommended to stop anticoagulation. She had had no recurrence of atrial fibrillation and her CHADS2VASc score was  1. Patient suspects that it was something done with anesthesia that likely provoked rhythm. She is now in NSR and doing well and has no symptoms. Case was discussed with Cardiologist Dr. Pernell Bradford who advised to start low dose Beta-Blocker and to continue ASA for prophylaxis. We repeated an ECHOCardiogram which was normal with normal wall motion and an EF of 65-70%. She will need to follow up with Cardiology as an outpatient. Thank you for this consult, TRH will sign off. Please don't hesitate to  re-consult if you have any questions.   Assessment & Plan:   Principal Problem:   Unilateral primary osteoarthritis, left hip Active Problems:   Atrial fibrillation (HCC)   Status post total replacement of left hip   Chest pain  Paroxysmal Atrial Fibrillation with RVR and Chest pain:  -Acute, but now resolved.  -Patient spontaneously went back into normal sinus rhythm after being given 1 sublingual nitroglycerin tablet for chest pain complaints.  -Chadsvasc score =1. EKG showing no significant ST-T wave changes. Recent surgical procedure as a possible cause of cardiac ischemia. -Patient Transferred to Telemetry Bed yesterday; If no Further A Fib RVR patient can be D/C'd off Telemetry in the next 24 hours -Trended Cardiac Enzymes and was <0.03 x 3  - Checked Eectrolytes with BMP and Magnesium; K+ was 3.6 and Mag was 1.9 - Continue to Monitor and Replace any electrolyte abnormalities as needed - Checked TSH and was 1.363 - Would continue patient on ASA 325 mg daily after she stops DVT Prophylaxis with ASA 81 mg po BID for A Fib Prophylaxis and follow up with Cardiology as an outpatient  - May warrant further investigation into anesthesia medications used and can likely provoke arrhythmias - Ordered ECHOCardiogram and was Normal study with EF of 65-70%  -Will place patient on low dose Beta-Blocker with Metoprolol 12.5 mg po BID -Ensure patient follows up with Cardiology Dr. Lovena Bradford as an outpatient for evaluation and additional recc's; -Thank you for this Consult TRH will sign off patient's Case, please re-consult if patient has any more issues   Left Hip OA with Labral Tear s/p Left Hip Total Arthroplasty Anterior Approach  2/2 to Fall POD1 -Management per Primary Team -Pain Control per Primary Team  DVT prophylaxis: SCD; ASA 81 mg po BID per Primary Team Code Status: FULL CODE Family Communication: Discussed with husband and daughter at bedside Disposition Plan: Per Primary  Team  Consultants:   [facility]  -Discussed Case with Cardiology Dr. Pernell Bradford   Procedures: Left Hip OA with Labral Tear s/p Left Hip Total Arthroplasty Anterior Approach 2/2 to Fall POD1   ECHOCARDIOGRAM Study Conclusions  - Left ventricle: The cavity size was normal. Systolic function was   vigorous. The estimated ejection fraction was in the range of 65%   to 70%. Wall motion was normal; there were no regional wall   motion abnormalities. - Aortic valve: There was no regurgitation. - Aortic root: The aortic root was normal in size. - Mitral valve: There was no regurgitation. - Left atrium: The atrium was normal in size. - Right ventricle: The cavity size was normal. Wall thickness was   normal. Systolic function was normal. - Right atrium: The atrium was normal in size. - Tricuspid valve: There was trivial regurgitation. - Pulmonary arteries: Systolic pressure was within the normal   range. - Inferior vena cava: The vessel was normal in size. - Pericardium, extracardiac: There was no pericardial effusion.  Impressions:  - Normal study.   Antimicrobials:  Anti-infectives    Start     Dose/Rate Route Frequency Ordered Stop   10/24/16 1600  clindamycin (CLEOCIN) IVPB 600 mg     600 mg 100 mL/hr over 30 Minutes Intravenous Every 6 hours 10/24/16 1258 10/25/16 0250   10/24/16 0754  clindamycin (CLEOCIN) IVPB 900 mg     900 mg 100 mL/hr over 30 Minutes Intravenous On call to O.R. 10/24/16 0754 10/24/16 1024     Subjective: Seen and examined and stated she had a rough night. No nasuea or vomiting and denied any CP now. Had no SOB and HR was back to a regular rhythm. No other concerns or complaints at this time.   Objective: Vitals:   10/24/16 2100 10/24/16 2314 10/25/16 0221 10/25/16 0550  BP: (!) 103/53 96/61 (!) 92/46 (!) 103/53  Pulse: (!) 143 80 69 70  Resp: 20 16 18 12   Temp: 98.8 F (37.1 C) 98.2 F (36.8 C) 98.4 F (36.9 C) 99.1 F (37.3 C)   TempSrc: Oral Oral Oral Oral  SpO2: 100% 100% 100% 100%  Weight:      Height:        Intake/Output Summary (Last 24 hours) at 10/25/16 1039 Last data filed at 10/25/16 0600  Gross per 24 hour  Intake          2163.75 ml  Output             3300 ml  Net         -1136.25 ml   Filed Weights   10/24/16 0816  Weight: 81.6 kg (180 lb)   Examination: Physical Exam:  Constitutional: NAD and appears calm and comfortable Eyes: Lids and conjunctivae normal, sclerae anicteric  ENMT: External Ears, Nose appear normal. Grossly normal hearing. Mucous membranes are moist.  Neck: Appears normal, supple, no cervical masses, normal ROM, no appreciable thyromegaly, no visible JVD Respiratory: Clear to auscultation bilaterally, no wheezing, rales, rhonchi or crackles. Normal respiratory effort and patient is not tachypenic. No accessory muscle use.  Cardiovascular: RRR, no murmurs / rubs / gallops. S1 and S2 auscultated. No extremity edema.  Abdomen: Soft, non-tender, non-distended. No masses  palpated. No appreciable hepatosplenomegaly. Bowel sounds positive x4.  GU: Deferred. Musculoskeletal: No clubbing / cyanosis of digits/nails. No joint deformity upper and lower extremities. Has some ROM in Left Leg Skin: No rashes, lesions, ulcers. No induration; Warm and dry. Neurologic: CN 2-12 grossly intact with no focal deficits. Romberg sign cerebellar reflexes not assessed.  Psychiatric: Normal judgment and insight. Alert and oriented x 3. Normal mood and appropriate affect.   Data Reviewed: I have personally reviewed following labs and imaging studies  CBC:  Recent Labs Lab 10/25/16 0354  WBC 6.9  HGB 10.2*  HCT 30.7*  MCV 89.5  PLT 956   Basic Metabolic Panel:  Recent Labs Lab 10/24/16 2215 10/25/16 0354  NA 142 143  K 3.4* 3.6  CL 107 109  CO2 28 29  GLUCOSE 172* 113*  BUN 12 12  CREATININE 0.84 0.78  CALCIUM 8.6* 8.3*  MG 1.9  --    GFR: Estimated Creatinine Clearance:  85.7 mL/min (by C-G formula based on SCr of 0.78 mg/dL). Liver Function Tests: No results for input(s): AST, ALT, ALKPHOS, BILITOT, PROT, ALBUMIN in the last 168 hours. No results for input(s): LIPASE, AMYLASE in the last 168 hours. No results for input(s): AMMONIA in the last 168 hours. Coagulation Profile: No results for input(s): INR, PROTIME in the last 168 hours. Cardiac Enzymes:  Recent Labs Lab 10/24/16 2215 10/25/16 0109 10/25/16 0354  TROPONINI <0.03 <0.03 <0.03   BNP (last 3 results) No results for input(s): PROBNP in the last 8760 hours. HbA1C: No results for input(s): HGBA1C in the last 72 hours. CBG: No results for input(s): GLUCAP in the last 168 hours. Lipid Profile: No results for input(s): CHOL, HDL, LDLCALC, TRIG, CHOLHDL, LDLDIRECT in the last 72 hours. Thyroid Function Tests:  Recent Labs  10/25/16 0354  TSH 1.363   Anemia Panel: No results for input(s): VITAMINB12, FOLATE, FERRITIN, TIBC, IRON, RETICCTPCT in the last 72 hours. Sepsis Labs: No results for input(s): PROCALCITON, LATICACIDVEN in the last 168 hours.  Recent Results (from the past 240 hour(s))  Surgical pcr screen     Status: None   Collection Time: 10/16/16  1:54 PM  Result Value Ref Range Status   MRSA, PCR NEGATIVE NEGATIVE Final   Staphylococcus aureus NEGATIVE NEGATIVE Final    Comment:        The Xpert SA Assay (FDA approved for NASAL specimens in patients over 69 years of age), is one component of a comprehensive surveillance program.  Test performance has been validated by [facility] for patients greater than or equal to 34 year old. It is not intended to diagnose infection nor to guide or monitor treatment.    Radiology Studies: Dg Pelvis Portable  Result Date: 10/24/2016 CLINICAL DATA:  Left hip replacement EXAM: PORTABLE PELVIS 1-2 VIEWS COMPARISON:  None. FINDINGS: Left total hip arthroplasty. No acute fracture or dislocation. No hardware failure or complication.  Postsurgical changes in the soft tissues around the left hip. IMPRESSION: Interval left total hip arthroplasty. Electronically Signed   By: Kathreen Devoid   On: 10/24/2016 12:24   Dg C-arm 61-120 Min-no Report  Result Date: 10/24/2016 Fluoroscopy was utilized by the requesting physician.  No radiographic interpretation.   Dg Hip Operative Unilat With Pelvis Left  Result Date: 10/24/2016 CLINICAL DATA:  Left hip replacement surgery EXAM: OPERATIVE LEFT HIP (WITH PELVIS IF PERFORMED) 4 VIEWS TECHNIQUE: Fluoroscopic spot image(s) were submitted for interpretation post-operatively. COMPARISON:  None. FINDINGS: left hip arthroplasty components project in  expected location. no fracture or dislocation. IMPRESSION: 1. LEFT HIP arthroplasty without apparent complication. Electronically Signed   By: Lucrezia Europe M.D.   On: 10/24/2016 11:20   Scheduled Meds: . aspirin  81 mg Oral BID  . docusate sodium  100 mg Oral BID  . ketorolac  7.5 mg Intravenous Q6H  . metoprolol tartrate  12.5 mg Oral BID  . progesterone  100 mg Oral QHS  . venlafaxine XR  37.5 mg Oral Q breakfast   Continuous Infusions: . sodium chloride 75 mL/hr at 10/24/16 2109  . methocarbamol (ROBAXIN)  IV 500 mg (10/24/16 1215)    LOS: 1 day   Kerney Elbe, DO [facility] Pager [phone]  If 7PM-7AM, please contact night-coverage [email or web address] Password [email or web address] 10/25/2016, 10:39 AM

## 2016-10-26 LAB — CBC
HEMATOCRIT: 28.2 % — AB (ref 36.0–46.0)
HEMOGLOBIN: 9.3 g/dL — AB (ref 12.0–15.0)
MCH: 29.3 pg (ref 26.0–34.0)
MCHC: 33 g/dL (ref 30.0–36.0)
MCV: 89 fL (ref 78.0–100.0)
Platelets: 177 10*3/uL (ref 150–400)
RBC: 3.17 MIL/uL — ABNORMAL LOW (ref 3.87–5.11)
RDW: 13.4 % (ref 11.5–15.5)
WBC: 7.6 10*3/uL (ref 4.0–10.5)

## 2016-10-26 NOTE — Progress Notes (Signed)
Physical Therapy Treatment Patient Details Name: Kristin Bradford MRN: [id number] DOB: May 28, 1956 Today's Date: 10/26/2016    History of Present Illness 61 yo female s/p L THA-direct anterior 10/24/16. Post-op Afib. Hx of post-op A fib, syncope, acoustic neuroma L ear    PT Comments    Patient is ready for Dc. Practiced steps/   Follow Up Recommendations  Home health PT;Supervision/Assistance - 24 hour     Equipment Recommendations  None recommended by PT    Recommendations for Other Services       Precautions / Restrictions Precautions Precautions: Fall Restrictions Weight Bearing Restrictions: No LLE Weight Bearing: Weight bearing as tolerated    Mobility  Bed Mobility               General bed mobility comments: pt in chair  Transfers Overall transfer level: Needs assistance Equipment used: Rolling walker (2 wheeled) Transfers: Sit to/from Stand Sit to Stand: Supervision Stand pivot transfers: Supervision       General transfer comment: VCs safety, technique, hand/LE placement.   Ambulation/Gait Ambulation/Gait assistance: Supervision Ambulation Distance (Feet): 200 Feet Assistive device: Rolling walker (2 wheeled) Gait Pattern/deviations: Step-through pattern         Stairs Stairs: Yes   Stair Management: One rail Right;Step to pattern;Forwards;With cane Number of Stairs: 10 General stair comments: cues fior safety and sequence  Wheelchair Mobility    Modified Rankin (Stroke Patients Only)       Balance                                            Cognition Arousal/Alertness: Awake/alert Behavior During Therapy: WFL for tasks assessed/performed Overall Cognitive Status: Within Functional Limits for tasks assessed                                        Exercises Total Joint Exercises Ankle Circles/Pumps: AROM;Both;10 reps;Supine Quad Sets: AROM;Both;10 reps;Supine Short Arc Quad: AROM;Left Heel  Slides: AAROM;Left;10 reps;Supine Hip ABduction/ADduction: AAROM;Left;10 reps;Supine Long Arc Quad: AROM;Left;10 reps    General Comments        Pertinent Vitals/Pain Pain Score: 2  Pain Location: L thigh Pain Descriptors / Indicators: Discomfort Pain Intervention(s): Ice applied    Home Living Family/patient expects to be discharged to:: Private residence Living Arrangements: Spouse/significant other Available Help at Discharge: Family Type of Home: House Home Access: Stairs to enter Entrance Stairs-Rails: Right;Left;Can reach both Home Layout: Bed/bath upstairs;Two level Home Equipment: Walker - 2 wheels;Cane - single point;Bedside commode      Prior Function Level of Independence: Independent          PT Goals (current goals can now be found in the care plan section) Acute Rehab PT Goals Patient Stated Goal: return to work in 3 weeks.  Progress towards PT goals: Progressing toward goals    Frequency    7X/week      PT Plan Current plan remains appropriate    Co-evaluation              AM-PAC PT "6 Clicks" Daily Activity  Outcome Measure  Difficulty turning over in bed (including adjusting bedclothes, sheets and blankets)?: A Little Difficulty moving from lying on back to sitting on the side of the bed? : A Little Difficulty sitting down on and standing  up from a chair with arms (e.g., wheelchair, bedside commode, etc,.)?: A Little Help needed moving to and from a bed to chair (including a wheelchair)?: A Little Help needed walking in hospital room?: A Little Help needed climbing 3-5 steps with a railing? : A Little 6 Click Score: 18    End of Session   Activity Tolerance: Patient tolerated treatment well Patient left: in chair;with call bell/phone within reach Nurse Communication: Mobility status PT Visit Diagnosis: Muscle weakness (generalized) (M62.81);Difficulty in walking, not elsewhere classified (R26.2)     Time: 2725-3664 PT Time  Calculation (min) (ACUTE ONLY): 34 min  Charges:  $Gait Training: 8-22 mins $Therapeutic Exercise: 8-22 mins                    G CodesTresa Endo PT [phone]    Claretha Cooper 10/26/2016, 1:27 PM

## 2016-10-26 NOTE — Evaluation (Signed)
Occupational Therapy Evaluation Patient Details Name: Kristin Bradford MRN: [id number] DOB: 04/27/1956 Today's Date: 10/26/2016    History of Present Illness 61 yo female s/p L THA-direct anterior 10/24/16. Post-op Afib. Hx of post-op A fib, syncope, acoustic neuroma L ear   Clinical Impression   OT education complete. No further OT needed.  Pt has needed  DME and family will A as needed    Follow Up Recommendations  No OT follow up    Equipment Recommendations  None recommended by OT    Recommendations for Other Services       Precautions / Restrictions Precautions Precautions: Fall Restrictions Weight Bearing Restrictions: No LLE Weight Bearing: Weight bearing as tolerated      Mobility Bed Mobility               General bed mobility comments: pt in chair  Transfers Overall transfer level: Needs assistance Equipment used: Rolling walker (2 wheeled) Transfers: Sit to/from [employer] Transfers Sit to Stand: Supervision Stand pivot transfers: Supervision       General transfer comment: VCs safety, technique, hand/LE placement.     Balance                                           ADL either performed or assessed with clinical judgement   ADL Overall ADL's : Needs assistance/impaired     Grooming: Supervision/safety;Standing;Oral care;Wash/dry face;Wash/dry hands   Upper Body Bathing: Set up;Sitting   Lower Body Bathing: Min guard;Sit to/from stand;Cueing for safety;Cueing for sequencing;Cueing for compensatory techniques   Upper Body Dressing : Set up;Sitting   Lower Body Dressing: Min guard;Cueing for safety;Cueing for sequencing;Cueing for compensatory techniques;Sit to/from stand   Toilet Transfer: Supervision/safety;Ambulation;Cueing for sequencing;Cueing for safety;Comfort height toilet   Toileting- Clothing Manipulation and Hygiene: Supervision/safety;Sit to/from stand   Tub/ Banker: Walk-in  shower;Supervision/safety           Vision Patient Visual Report: No change from baseline       Perception     Praxis      Pertinent Vitals/Pain Pain Score: 1  Pain Location: L thigh Pain Descriptors / Indicators: Discomfort Pain Intervention(s): Monitored during session     Hand Dominance     Extremity/Trunk Assessment Upper Extremity Assessment Upper Extremity Assessment: Overall WFL for tasks assessed       Cervical / Trunk Assessment Cervical / Trunk Assessment: Normal   Communication Communication Communication: HOH   Cognition Arousal/Alertness: Awake/alert Behavior During Therapy: WFL for tasks assessed/performed Overall Cognitive Status: Within Functional Limits for tasks assessed                                     General Comments       Exercises     Shoulder Instructions      Home Living Family/patient expects to be discharged to:: Private residence Living Arrangements: Spouse/significant other Available Help at Discharge: Family Type of Home: House Home Access: Stairs to enter Technical brewer of Steps: 3 Entrance Stairs-Rails: Right;Left;Can reach both Home Layout: Bed/bath upstairs;Two level   Alternate Level Stairs-Rails: Right Bathroom Shower/Tub: Occupational psychologist: Standard     Home Equipment: Environmental consultant - 2 wheels;Cane - single point;Bedside commode          Prior Functioning/Environment Level of Independence:  Independent                 OT Problem List:        OT Treatment/Interventions:      OT Goals(Current goals can be found in the care plan section) Acute Rehab OT Goals Patient Stated Goal: return to work in 3 weeks.  OT Goal Formulation: With patient  OT Frequency:     Barriers to D/C:            Co-evaluation              AM-PAC PT "6 Clicks" Daily Activity     Outcome Measure Help from another person eating meals?: None Help from another person taking care  of personal grooming?: None Help from another person toileting, which includes using toliet, bedpan, or urinal?: None Help from another person bathing (including washing, rinsing, drying)?: A Little Help from another person to put on and taking off regular upper body clothing?: None Help from another person to put on and taking off regular lower body clothing?: A Little 6 Click Score: 22   End of Session Equipment Utilized During Treatment: Rolling walker Nurse Communication: Mobility status  Activity Tolerance: Patient tolerated treatment well Patient left: in chair;with family/visitor present                   Time: 0922-0941 OT Time Calculation (min): 19 min Charges:  OT General Charges $OT Visit: 1 Procedure OT Evaluation $OT Eval Moderate Complexity: 1 Procedure G-Codes:     Kari Baars, OT [phone]  Payton Mccallum D 10/26/2016, 9:51 AM

## 2016-10-26 NOTE — Discharge Summary (Signed)
Discharge Diagnoses:  Principal Problem:   Unilateral primary osteoarthritis, left hip Active Problems:   Atrial fibrillation (HCC)   Status post total replacement of left hip   Chest pain   Surgeries: Procedure(s): LEFT TOTAL HIP ARTHROPLASTY ANTERIOR APPROACH on 10/24/2016    Consultants:   Discharged Condition: Improved  Hospital Course: Kristin Bradford is an 61 y.o. female who was admitted 10/24/2016 with a chief complaint of osteoarthritis left hip, with a final diagnosis of left hip osteoarthritis with labral tear.  Patient was brought to the operating room on 10/24/2016 and underwent Procedure(s): LEFT TOTAL HIP ARTHROPLASTY ANTERIOR APPROACH.    Patient was given perioperative antibiotics: Anti-infectives    Start     Dose/Rate Route Frequency Ordered Stop   10/24/16 1600  clindamycin (CLEOCIN) IVPB 600 mg     600 mg 100 mL/hr over 30 Minutes Intravenous Every 6 hours 10/24/16 1258 10/25/16 0250   10/24/16 0754  clindamycin (CLEOCIN) IVPB 900 mg     900 mg 100 mL/hr over 30 Minutes Intravenous On call to O.R. 10/24/16 8676 10/24/16 1024    .  Patient was given sequential compression devices, early ambulation, and aspirin for DVT prophylaxis.  Recent vital signs: Patient Vitals for the past 24 hrs:  BP Temp Temp src Pulse Resp SpO2  10/26/16 0514 (!) 119/49 98.5 F (36.9 C) Oral 82 18 100 %  10/25/16 2338 - 99.8 F (37.7 C) Oral - - -  10/25/16 2103 (!) 117/47 (!) 100.4 F (38 C) Oral 77 16 100 %  10/25/16 1401 (!) 94/44 - - (!) 56 - 100 %  .  Recent laboratory studies: Dg Pelvis Portable  Result Date: 10/24/2016 CLINICAL DATA:  Left hip replacement EXAM: PORTABLE PELVIS 1-2 VIEWS COMPARISON:  None. FINDINGS: Left total hip arthroplasty. No acute fracture or dislocation. No hardware failure or complication. Postsurgical changes in the soft tissues around the left hip. IMPRESSION: Interval left total hip arthroplasty. Electronically Signed   By: Kathreen Devoid   On:  10/24/2016 12:24   Dg C-arm 61-120 Min-no Report  Result Date: 10/24/2016 Fluoroscopy was utilized by the requesting physician.  No radiographic interpretation.   Dg Hip Operative Unilat With Pelvis Left  Result Date: 10/24/2016 CLINICAL DATA:  Left hip replacement surgery EXAM: OPERATIVE LEFT HIP (WITH PELVIS IF PERFORMED) 4 VIEWS TECHNIQUE: Fluoroscopic spot image(s) were submitted for interpretation post-operatively. COMPARISON:  None. FINDINGS: left hip arthroplasty components project in expected location. no fracture or dislocation. IMPRESSION: 1. LEFT HIP arthroplasty without apparent complication. Electronically Signed   By: Lucrezia Europe M.D.   On: 10/24/2016 11:20    Discharge Medications:   Allergies as of 10/26/2016      Reactions   Betadine [povidone Iodine] Rash   Codeine Nausea And Vomiting   Fentanyl Nausea And Vomiting   Other    Steroid injections make her hair fall out   Penicillins Itching   Has patient had a PCN reaction causing immediate rash, facial/tongue/throat swelling, SOB or lightheadedness with hypotension: Yes Has patient had a PCN reaction causing severe rash involving mucus membranes or skin necrosis: No Has patient had a PCN reaction that required hospitalization No Has patient had a PCN reaction occurring within the last 10 years: No If all of the above answers are "NO", then may proceed with Cephalosporin use.   Tetanus Toxoids Nausea And Vomiting, Other (See Comments)   High fever      Medication List    TAKE these medications  ALPRAZolam 0.5 MG tablet Commonly known as:  XANAX Take 0.25 mg by mouth daily as needed for anxiety.   aspirin 81 MG chewable tablet Chew 1 tablet (81 mg total) by mouth 2 (two) times daily.   diphenhydramine-acetaminophen 25-500 MG Tabs tablet Commonly known as:  TYLENOL PM Take 1 tablet by mouth at bedtime as needed (pain/sleep).   HAIR/SKIN/NAILS PO Take 1 tablet by mouth daily.   methocarbamol 500 MG  tablet Commonly known as:  ROBAXIN Take 1 tablet (500 mg total) by mouth every 6 (six) hours as needed for muscle spasms.   ondansetron 4 MG disintegrating tablet Commonly known as:  ZOFRAN ODT Take 1 tablet (4 mg total) by mouth every 8 (eight) hours as needed for nausea or vomiting.   oxyCODONE-acetaminophen 5-325 MG tablet Commonly known as:  ROXICET Take 1-2 tablets by mouth every 4 (four) hours as needed.   polyethylene glycol powder powder Commonly known as:  GLYCOLAX/MIRALAX Take 0.25 Containers by mouth daily.   progesterone 100 MG capsule Commonly known as:  PROMETRIUM Take 100 mg by mouth at bedtime.   venlafaxine XR 37.5 MG 24 hr capsule Commonly known as:  EFFEXOR-XR Take 37.5 mg by mouth daily with breakfast.            Durable Medical Equipment        Start     Ordered   10/24/16 1259  DME Walker rolling  Once    Question:  Patient needs a walker to treat with the following condition  Answer:  Status post total replacement of left hip   10/24/16 1258   10/24/16 1259  DME 3 n 1  Once     10/24/16 1258      Diagnostic Studies: Dg Pelvis Portable  Result Date: 10/24/2016 CLINICAL DATA:  Left hip replacement EXAM: PORTABLE PELVIS 1-2 VIEWS COMPARISON:  None. FINDINGS: Left total hip arthroplasty. No acute fracture or dislocation. No hardware failure or complication. Postsurgical changes in the soft tissues around the left hip. IMPRESSION: Interval left total hip arthroplasty. Electronically Signed   By: Kathreen Devoid   On: 10/24/2016 12:24   Dg C-arm 61-120 Min-no Report  Result Date: 10/24/2016 Fluoroscopy was utilized by the requesting physician.  No radiographic interpretation.   Dg Hip Operative Unilat With Pelvis Left  Result Date: 10/24/2016 CLINICAL DATA:  Left hip replacement surgery EXAM: OPERATIVE LEFT HIP (WITH PELVIS IF PERFORMED) 4 VIEWS TECHNIQUE: Fluoroscopic spot image(s) were submitted for interpretation post-operatively. COMPARISON:   None. FINDINGS: left hip arthroplasty components project in expected location. no fracture or dislocation. IMPRESSION: 1. LEFT HIP arthroplasty without apparent complication. Electronically Signed   By: Lucrezia Europe M.D.   On: 10/24/2016 11:20    Patient benefited maximally from their hospital stay and there were no complications.     Disposition: 01-Home or Self Care Discharge Instructions    Call MD / Call 911    Complete by:  As directed    If you experience chest pain or shortness of breath, CALL 911 and be transported to the hospital emergency room.  If you develope a fever above 101 F, pus (white drainage) or increased drainage or redness at the wound, or calf pain, call your surgeon's office.   Constipation Prevention    Complete by:  As directed    Drink plenty of fluids.  Prune juice may be helpful.  You may use a stool softener, such as Colace (over the counter) 100 mg twice a day.  Use MiraLax (over the counter) for constipation as needed.   Diet - low sodium heart healthy    Complete by:  As directed    Increase activity slowly as tolerated    Complete by:  As directed      Follow-up Information    Mcarthur Rossetti, MD Follow up in 2 week(s).   Specialty:  Orthopedic Surgery Contact information: [address] [place] [address] [phone]            Signed: Newt Minion 10/26/2016, 6:26 AM

## 2016-10-26 NOTE — Progress Notes (Signed)
Patient Discharged Home. Discharge instructions  provided to the patient which included instructions that addressed activity level, diet, discharge medications, follow-up appointments, weight monitoring and what to do if symptoms worsen. All patients Questions answered.   

## 2016-10-26 NOTE — Care Management Note (Signed)
Case Management Note  Patient Details  Name: MAKAYLEN THIEME MRN: [id number] Date of Birth: [birth year]  Subjective/Objective:    S/p L THA                Action/Plan: Discharge Planning: NCM spoke to pt and offered choice for [facility]. Pt agreeable to [facility] for [facility]. Pt states she has RW and bedside commode at home. Has good family support.  PCP Mayra Neer MD  Expected Discharge Date:  10/26/16               Expected Discharge Plan:  [place]  In-House Referral:  NA  Discharge planning Services  CM Consult  Post Acute Care Choice:  Home Health Choice offered to:  Patient  DME Arranged:  N/A DME Agency:  NA  HH Arranged:  PT [place] Agency:  [facility]  Status of Service:  Completed, signed off  If discussed at [employer] of Stay Meetings, dates discussed:    Additional Comments:  Erenest Rasher, RN 10/26/2016, 10:54 AM

## 2016-10-28 NOTE — Op Note (Signed)
NAME:  Kristin Bradford, Kristin Bradford                      ACCOUNT NO.:  MEDICAL RECORD NO.:  [id number]  LOCATION:                                 FACILITY:  PHYSICIAN:  Lind Guest. Ninfa Linden, M.D.DATE OF BIRTH:  DATE OF PROCEDURE:  10/24/2016 DATE OF DISCHARGE:                              OPERATIVE REPORT   PREOPERATIVE DIAGNOSIS:  Severe left hip pain with only mild osteoarthritis, but superior and anterior labral tearing.  POSTOPERATIVE DIAGNOSIS:  Severe left hip pain with only mild osteoarthritis, but superior and anterior labral tearing.  PROCEDURE:  Left total hip arthroplasty through direct anterior approach.  IMPLANTS:  DePuy Sector Gription acetabular component size 50, size 32+ 0 polyethylene liner, size 9 Corail femoral component with varus offset, size 36+ 1 ceramic hip ball.  SURGEON:  Lind Guest. Ninfa Linden, MD.  ASSISTANT:  Erskine Emery, PA-C.  ANESTHESIA:  Spinal.  ANTIBIOTICS:  900 mg of IV clindamycin.  BLOOD LOSS:  400 mL.  COMPLICATIONS:  None.  INDICATIONS:  Kristin Bradford is a very pleasant 61 year old female, who denies any previous hip pain until a mechanical fall injuring her left hip, I believe less than a year ago.  She had debilitating hip pain, eventually someone send her for an MRI of her hip and an MRI showed only mild arthritic changes, but superior and anterior labral tearing.  She then was seen by hip arthroscopy specialist, who felt that due to her already mild arthritis that the superior and anterior labral tears would not be worth addressing arthroscopically due to the potential further damage to the hip cartilage getting into the hip for this and the nature of these tears are not amenable apparently to arthroscopic repair.  With that being said, the recommendation was seeing me to consider hip replacement surgery.  She had already tried an intraarticular injection and due to the continued pain and disability this is causing her, she did wish to  proceed with a total hip arthroplasty.  Her pain is daily and it is 10/10.  It has detrimentally effected her activities of daily living, her quality of life, and her mobility.  She understands with surgery.  There is a risk of acute blood loss anemia, nerve and vessel injury, fracture, infection, dislocation, and DVT.  She understands our goals are decreased pain, improved mobility, and overall improved quality of life.  PROCEDURE DESCRIPTION:  After informed consent was obtained, appropriate left hip was marked.  She was brought to the operating room, where spinal anesthesia was obtained while she was on her stretcher.  A Foley catheter was placed and then traction boots were placed on both of her feet.  Of note, her preoperative leg lengths are actually equal.  Her intraoperative and preoperative standing films show radiographically that the left hip was shorter than the right, but clinically, she is not that.  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  She was then placed supine on the Hana fracture table with the perineal post in place and both legs in InLine skeletal traction devices, but no traction applied.  The left operative  hip was prepped and draped with ChloraPrep and sterile drapes given her allergies.  A time-out was called and she was identified as a correct patient and correct left hip.  We then made an incision just inferior and posterior to the anterior superior iliac spine and carried this obliquely down the leg.  We dissected down the tensor fascia lata muscle, and tensor fascia was then divided longitudinally to proceed with a direct anterior approach to the hip.  We identified and cauterized circumflex vessels, then identified the hip capsule and had actually excised most of the hip capsule.  We opened up the hip capsule in a L-type format and found minimal effusion and you really could not tell the extent of the  labral tearing on this visualization just because the nature of the surgery getting down to the hip.  We then made our femoral neck cut with an oscillating saw proximal to the lesser trochanter and completed this on osteotome.  We placed a corkscrew guide in the femoral head and removed the femoral head in its entirety.  We found no significant cartilage deficits at all in the femoral head.  We then cleaned the remaining labral remnants from the acetabular soft tissue.  We then began reaming under direct visualization from a size 42 reamer going up to a size 50.  With all reamers under direct visualization, the last reamer under direct fluoroscopy, so we could obtain our depth of reaming, our inclination, and anteversion.  We then placed the real DePuy Sector Gription acetabular component size 50 and a 32+ 0 neutral polyethylene liner.  Attention was then turned to the femur.  With the leg externally rotated to 130 degrees, extended and adducted, we were to place a Mueller retractor medially and a Hohmann retractor behind the greater trochanter.  I released the lateral joint capsule and used a box cutting osteotome, the inner femoral canal and a rongeur to lateralize.  We only placed 2 broaches because her tight canal went up to a size 9.  We then trialed a varus offset femoral neck based off her anatomy, and a 32+ 1 hip ball reduced this in acetabulum and we were pleased with matching her original x-rays and leg lengths as well as the stability and offset of the hip on range of motion and exam. We then dislocated the hip and removed the trial components.  I placed the real Corail femoral component with varus offset size 9 and the real 32 +1 hip ball, reduced this in the acetabulum, and it was stable.  We then copiously irrigated the soft tissue with normal saline solution using pulsatile lavage.  There was only a small remnant of capsule I closed with interrupted #1 Ethibond suture,  followed by running #1 Vicryl in the tensor fascia, 0 Vicryl in the deep tissue, 2-0 Vicryl in the subcutaneous tissue, 4-0 Monocryl subcuticular stitch, and Steri- Strips on the skin.  Aquacel dressing was applied.  She was taken off the Hana table and taken to the recovery room in stable condition.  All final counts were correct.  There were no complications noted.  Of note, Erskine Emery, PA-C, assisted in the entire case.  His assistance was crucial for facilitating all aspects of this case.     Lind Guest. Ninfa Linden, M.D.     CYB/MEDQ  D:  10/24/2016  T:  10/24/2016  Job:  [id number]

## 2016-10-29 ENCOUNTER — Encounter (HOSPITAL_COMMUNITY): Payer: Self-pay | Admitting: Orthopaedic Surgery

## 2016-10-29 MED FILL — NITROFURANTOIN MCR 100 MG C: 100 | 3 days supply | Qty: 6 | Fill #0

## 2016-10-31 ENCOUNTER — Telehealth (INDEPENDENT_AMBULATORY_CARE_PROVIDER_SITE_OTHER): Payer: Self-pay

## 2016-10-31 ENCOUNTER — Telehealth: Payer: Self-pay | Admitting: Cardiovascular Disease

## 2016-10-31 NOTE — Telephone Encounter (Signed)
New message      Pt is requesting to switch from Dr Oval Linsey to Dr Marlou Porch or Dr Irish Lack

## 2016-10-31 NOTE — Telephone Encounter (Signed)
Debbie with [facility] calling for verbal orders for PT for patient.  Gave verbal orders for 2 x week for 1 week and 3 x week for 2nd week.  Cb# is [phone].

## 2016-11-01 NOTE — Telephone Encounter (Signed)
Fine with me

## 2016-11-04 MED FILL — PROGESTERONE 100 MG CAPSULE: 100 | 30 days supply | Qty: 30 | Fill #5

## 2016-11-06 ENCOUNTER — Ambulatory Visit (INDEPENDENT_AMBULATORY_CARE_PROVIDER_SITE_OTHER): Payer: PRIVATE HEALTH INSURANCE | Admitting: Orthopaedic Surgery

## 2016-11-06 ENCOUNTER — Telehealth (INDEPENDENT_AMBULATORY_CARE_PROVIDER_SITE_OTHER): Payer: Self-pay | Admitting: Orthopaedic Surgery

## 2016-11-06 DIAGNOSIS — Z96642 Presence of left artificial hip joint: Secondary | ICD-10-CM

## 2016-11-06 NOTE — Telephone Encounter (Signed)
Please advise 

## 2016-11-06 NOTE — Telephone Encounter (Signed)
Tell her my guy is Cristopher Peru, and EP specialist who does ablations.  She would need a regular cardiologist before that and I would recommend Dr. Jenkins Rouge, Dr. Tollie Eth, or Dr. Loralie Champagne.

## 2016-11-06 NOTE — Telephone Encounter (Signed)
Patient called asked if Dr Ninfa Linden can tell her the name of his cardiologist? The number to contact patient is [phone]

## 2016-11-06 NOTE — Progress Notes (Signed)
The patient is return for follow-up after a total hip arthroplasty 13 days ago. She is Librarian, academic with a cane and having problems sleeping at night with rolling over in bed in some twisting activities causing some pain but overall she is doing well.  On examination her left hip incision looks good. There is no significant seroma. Her leg lengths are equal. Her knee exam shows some pain but otherwise is not stable.  She'll continue increase her activities as she is comfortable. She'll get rid of her cane when she is comfortable when she can drive at this point is only taken Tylenol for pain. She can stop her aspirin as well. I like see her back in a month to see how she doing overall. She can start half days at work with part-time work starting June 25 and full work starting July 2.

## 2016-11-07 ENCOUNTER — Encounter (INDEPENDENT_AMBULATORY_CARE_PROVIDER_SITE_OTHER): Payer: Self-pay

## 2016-11-07 ENCOUNTER — Telehealth (INDEPENDENT_AMBULATORY_CARE_PROVIDER_SITE_OTHER): Payer: Self-pay | Admitting: *Deleted

## 2016-11-07 NOTE — Telephone Encounter (Signed)
Left this below info on her VM

## 2016-11-07 NOTE — Telephone Encounter (Signed)
Pt asking for a note for her massage therapist, they are requring a note stating when she can get a massage again. Pt asking if this can be mailed to her.

## 2016-11-07 NOTE — Telephone Encounter (Signed)
Mailed to patient home per her request  

## 2016-11-07 NOTE — Telephone Encounter (Signed)
Please advise 

## 2016-11-07 NOTE — Telephone Encounter (Signed)
She can get a massage again starting June 18th

## 2016-11-12 NOTE — Telephone Encounter (Signed)
Patient has appointment with Dr Irish Lack 12/08/16

## 2016-11-14 ENCOUNTER — Ambulatory Visit (INDEPENDENT_AMBULATORY_CARE_PROVIDER_SITE_OTHER): Payer: PRIVATE HEALTH INSURANCE

## 2016-11-14 ENCOUNTER — Ambulatory Visit (INDEPENDENT_AMBULATORY_CARE_PROVIDER_SITE_OTHER): Payer: PRIVATE HEALTH INSURANCE | Admitting: Physician Assistant

## 2016-11-14 DIAGNOSIS — M25552 Pain in left hip: Secondary | ICD-10-CM

## 2016-11-14 DIAGNOSIS — M7062 Trochanteric bursitis, left hip: Secondary | ICD-10-CM

## 2016-11-14 DIAGNOSIS — Z96642 Presence of left artificial hip joint: Secondary | ICD-10-CM

## 2016-11-14 MED ORDER — NAPROXEN 500 MG PO TABS: 500.0000 mg | ORAL_TABLET | Freq: Two times a day (BID) | ORAL | 1 refills | Status: DC

## 2016-11-14 MED FILL — NAPROXEN 500 MG TABLET: 500 | 30 days supply | Qty: 60 | Fill #0

## 2016-11-14 NOTE — Progress Notes (Signed)
Kristin Bradford returns today status post left total hip arthroplasty 10/24/2016. She states she's had a "hitch/catch" in her left hip for the past 2 weeks. States that it she has a lot of pain and has trouble walking to the point she collapses. She feels like something may be giving way in the hip causing her to fall. She is back to ambulate with a cane. She said no injury to the hip. He does radiate down the knee but not beyond. She denies any back pain numbness tingling down the leg.  Physical exam: Left hip she has stiffness with internal rotation. Some discomfort with external rotation. Tenderness over the left trochanteric region. Left calf supple nontender. Surgical incisions healing well no signs of infection.  Radiographs: AP pelvis lateral view of the left hip shows no acute fracture. No change in overall position alignment of the acetabular cup. No loosening of the femoral stem.  Impression 3 weeks status post left total hip arthroplasty  Left hip trochanteric bursitis  Plan: Work on IT band stretching. Other than that I just 1 or walking for exercise or blood for use her walker for ambulation this because she has been having some falls. We'll see her back next week see how she is progressing. Did put her on some naproxen. She'll use heat over the hip.

## 2016-11-20 ENCOUNTER — Encounter: Payer: Self-pay | Admitting: Interventional Cardiology

## 2016-11-20 ENCOUNTER — Ambulatory Visit (INDEPENDENT_AMBULATORY_CARE_PROVIDER_SITE_OTHER): Payer: PRIVATE HEALTH INSURANCE | Admitting: Orthopaedic Surgery

## 2016-11-20 DIAGNOSIS — Z96642 Presence of left artificial hip joint: Secondary | ICD-10-CM

## 2016-11-20 NOTE — Progress Notes (Signed)
Mrs. Strom is now 4 weeks status post a left total hip arthroplasty. This was due to a traumatic labral tear of her hip did she did not heal. Her pain is been severe. Postoperatively she's had a lot of problems with IT band pain and is slowed her down recovery wise. Overall she is doing well. Exam of lidocaine as well. Without her back off her exercises due to the trochanteric bursitis.  On examination her leg was are equal her scar looks good. She still has pain over trochanteric area to be expected. We had a long and thorough discussion about her still going slow. She'll start half days next week at work and she's continue naproxen. She'll try intermittent ice and heat and I'll see her back in a month's he is doing overall but no x-rays needed.

## 2016-12-04 ENCOUNTER — Ambulatory Visit (INDEPENDENT_AMBULATORY_CARE_PROVIDER_SITE_OTHER): Payer: Self-pay | Admitting: Orthopaedic Surgery

## 2016-12-04 MED FILL — PROGESTERONE 100 MG CAPSULE: 100 | 30 days supply | Qty: 30 | Fill #6

## 2016-12-08 ENCOUNTER — Ambulatory Visit: Payer: PRIVATE HEALTH INSURANCE | Admitting: Interventional Cardiology

## 2016-12-11 ENCOUNTER — Telehealth (INDEPENDENT_AMBULATORY_CARE_PROVIDER_SITE_OTHER): Payer: Self-pay | Admitting: Orthopaedic Surgery

## 2016-12-11 NOTE — Telephone Encounter (Signed)
PT WANTS TO KNOW IF SHE CAN COME IN EARLIER FOR A CORTISONE INJ, PLEASE ADVISE.  [phone] [phone]

## 2016-12-12 ENCOUNTER — Telehealth (INDEPENDENT_AMBULATORY_CARE_PROVIDER_SITE_OTHER): Payer: Self-pay

## 2016-12-12 NOTE — Telephone Encounter (Signed)
Patient aware of the below message  

## 2016-12-12 NOTE — Telephone Encounter (Signed)
Patient called wanting to know if she was able to get a cort. Injection being that she just had surgery on Oct 24, 2016?  CB# is [phone].

## 2016-12-12 NOTE — Telephone Encounter (Signed)
See below; Assuming she wants an injection for her bursitis in the hip

## 2016-12-12 NOTE — Telephone Encounter (Signed)
She needs to be at least 3 months out from her surgery before an injection in that area due to infection risks

## 2016-12-17 MED FILL — VENLAFAXINE HCL ER 37.5 MG: 37.5 | 30 days supply | Qty: 60 | Fill #4

## 2016-12-18 ENCOUNTER — Ambulatory Visit (INDEPENDENT_AMBULATORY_CARE_PROVIDER_SITE_OTHER): Payer: PRIVATE HEALTH INSURANCE | Admitting: Orthopaedic Surgery

## 2016-12-18 DIAGNOSIS — Z96642 Presence of left artificial hip joint: Secondary | ICD-10-CM

## 2016-12-18 MED ORDER — METHYLPREDNISOLONE 4 MG PO TABS: ORAL_TABLET | ORAL | 0 refills | Status: DC

## 2016-12-18 MED FILL — METHYLPREDNISOLONE 4 MG TAB: 4 | 6 days supply | Qty: 21 | Fill #0

## 2016-12-18 NOTE — Progress Notes (Signed)
The patient is now 7 weeks status post a left total hip arthroplasty through direct injury approach. She still feels that she is not making progress and not doing well. She has pain is pretty constant since worse with weightbearing.  On examination she has significant pain over the left trochanteric area radiating into her IT band. Her hip otherwise moves fluidly but is painful to her.  I gave her reassurance of this can be, after surgery such as this. Specialty when she was dealing with surgery there was the result of a posttraumatic event. I will try six-day steroid taper and is essentially getting physical therapy to work on modalities to help decrease the pain over her trochanteric area and her IT band. I would like see her back in 4 weeks to see how she doing overall. I would like an AP and lateral of her left hip at that visit.

## 2016-12-24 MED FILL — SULFAMETHOXAZOLE/TMP DS TAB: 800-160 | 3 days supply | Qty: 6 | Fill #0

## 2016-12-26 ENCOUNTER — Ambulatory Visit (INDEPENDENT_AMBULATORY_CARE_PROVIDER_SITE_OTHER): Payer: PRIVATE HEALTH INSURANCE | Admitting: Cardiology

## 2016-12-26 ENCOUNTER — Encounter: Payer: Self-pay | Admitting: Cardiology

## 2016-12-26 VITALS — BP 110/62 | HR 71 | Ht 71.0 in | Wt 181.8 lb

## 2016-12-26 DIAGNOSIS — I480 Paroxysmal atrial fibrillation: Secondary | ICD-10-CM

## 2016-12-26 MED ORDER — METOPROLOL TARTRATE 25 MG PO TABS: 25.0000 mg | ORAL_TABLET | Freq: Two times a day (BID) | ORAL | 3 refills | Status: DC | PRN

## 2016-12-26 MED FILL — METOPROLOL TARTRATE 25 MG T: 25 | 30 days supply | Qty: 60 | Fill #0

## 2016-12-26 NOTE — Patient Instructions (Addendum)
Medication Instructions:  TAKE Metoprolol (Lopressor) 25 mg as needed for irregular or fast heart rate (do not exceed 3 tablets in one day) and call our office if you need to take this medication frequently   Labwork: None Ordered   Testing/Procedures: None Ordered   Follow-Up: Your physician wants you to follow-up in: 3-6 months with Dr. Irish Lack.  You will receive a reminder letter in the mail two months in advance. If you don't receive a letter, please call our office to schedule the follow-up appointment.   If you need a refill on your cardiac medications before your next appointment, please call your pharmacy.   Thank you for choosing [facility]! Christen Bame, RN [phone]       Atrial Fibrillation Atrial fibrillation is a type of irregular or rapid heartbeat (arrhythmia). In atrial fibrillation, the heart quivers continuously in a chaotic pattern. This occurs when parts of the heart receive disorganized signals that make the heart unable to pump blood normally. This can increase the risk for stroke, heart failure, and other heart-related conditions. There are different types of atrial fibrillation, including:  Paroxysmal atrial fibrillation. This type starts suddenly, and it usually stops on its own shortly after it starts.  Persistent atrial fibrillation. This type often lasts longer than a week. It may stop on its own or with treatment.  Long-lasting persistent atrial fibrillation. This type lasts longer than 12 months.  Permanent atrial fibrillation. This type does not go away.  Talk with your health care provider to learn about the type of atrial fibrillation that you have. What are the causes? This condition is caused by some heart-related conditions or procedures, including:  A heart attack.  Coronary artery disease.  Heart failure.  Heart valve conditions.  High blood pressure.  Inflammation of the sac that surrounds the heart  (pericarditis).  Heart surgery.  Certain heart rhythm disorders, such as Wolf-Parkinson-White syndrome.  Other causes include:  Pneumonia.  Obstructive sleep apnea.  Blockage of an artery in the lungs (pulmonary embolism, or PE).  Lung cancer.  Chronic lung disease.  Thyroid problems, especially if the thyroid is overactive (hyperthyroidism).  Caffeine.  Excessive alcohol use or illegal drug use.  Use of some medicines, including certain decongestants and diet pills.  Sometimes, the cause cannot be found. What increases the risk? This condition is more likely to develop in:  People who are older in age.  People who smoke.  People who have diabetes mellitus.  People who are overweight (obese).  Athletes who exercise vigorously.  What are the signs or symptoms? Symptoms of this condition include:  A feeling that your heart is beating rapidly or irregularly.  A feeling of discomfort or pain in your chest.  Shortness of breath.  Sudden light-headedness or weakness.  Getting tired easily during exercise.  In some cases, there are no symptoms. How is this diagnosed? Your health care provider may be able to detect atrial fibrillation when taking your pulse. If detected, this condition may be diagnosed with:  An electrocardiogram (ECG).  A Holter monitor test that records your heartbeat patterns over a 24-hour period.  Transthoracic echocardiogram (TTE) to evaluate how blood flows through your heart.  Transesophageal echocardiogram (TEE) to view more detailed images of your heart.  A stress test.  Imaging tests, such as a CT scan or chest X-ray.  Blood tests.  How is this treated? The main goals of treatment are to prevent blood clots from forming and to keep your heart  beating at a normal rate and rhythm. The type of treatment that you receive depends on many factors, such as your underlying medical conditions and how you feel when you are experiencing  atrial fibrillation. This condition may be treated with:  Medicine to slow down the heart rate, bring the heart's rhythm back to normal, or prevent clots from forming.  Electrical cardioversion. This is a procedure that resets your heart's rhythm by delivering a controlled, low-energy shock to the heart through your skin.  Different types of ablation, such as catheter ablation, catheter ablation with pacemaker, or surgical ablation. These procedures destroy the heart tissues that send abnormal signals. When the pacemaker is used, it is placed under your skin to help your heart beat in a regular rhythm.  Follow these instructions at home:  Take over-the counter and prescription medicines only as told by your health care provider.  If your health care provider prescribed a blood-thinning medicine (anticoagulant), take it exactly as told. Taking too much blood-thinning medicine can cause bleeding. If you do not take enough blood-thinning medicine, you will not have the protection that you need against stroke and other problems.  Do not use tobacco products, including cigarettes, chewing tobacco, and e-cigarettes. If you need help quitting, ask your health care provider.  If you have obstructive sleep apnea, manage your condition as told by your health care provider.  Do not drink alcohol.  Do not drink beverages that contain caffeine, such as coffee, soda, and tea.  Maintain a healthy weight. Do not use diet pills unless your health care provider approves. Diet pills may make heart problems worse.  Follow diet instructions as told by your health care provider.  Exercise regularly as told by your health care provider.  Keep all follow-up visits as told by your health care provider. This is important. How is this prevented?  Avoid drinking beverages that contain caffeine or alcohol.  Avoid certain medicines, especially medicines that are used for breathing problems.  Avoid certain  herbs and herbal medicines, such as those that contain ephedra or ginseng.  Do not use illegal drugs, such as cocaine and amphetamines.  Do not smoke.  Manage your high blood pressure. Contact a health care provider if:  You notice a change in the rate, rhythm, or strength of your heartbeat.  You are taking an anticoagulant and you notice increased bruising.  You tire more easily when you exercise or exert yourself. Get help right away if:  You have chest pain, abdominal pain, sweating, or weakness.  You feel nauseous.  You notice blood in your vomit, bowel movement, or urine.  You have shortness of breath.  You suddenly have swollen feet and ankles.  You feel dizzy.  You have sudden weakness or numbness of the face, arm, or leg, especially on one side of the body.  You have trouble speaking, trouble understanding, or both (aphasia).  Your face or your eyelid droops on one side. These symptoms may represent a serious problem that is an emergency. Do not wait to see if the symptoms will go away. Get medical help right away. Call your local emergency services (911 in the [place] Do not drive yourself to the hospital. This information is not intended to replace advice given to you by your health care provider. Make sure you discuss any questions you have with your health care provider. Document Released: 05/19/2005 Document Revised: 09/26/2015 Document Reviewed: 09/13/2014 Elsevier Interactive Patient Education  2017 [employer].

## 2016-12-26 NOTE — Progress Notes (Signed)
12/26/2016 Kristin Bradford   1955/08/19  [id number]  Primary Physician Mayra Neer, MD Primary Cardiologist: Dr. Irish Lack  Electrophysiologist: Dr. Lovena Le   Reason for Visit/CC: PAF (post-operative)  HPI:  Kristin Bradford is a 61 y.o. female who is being seen today for the evaluation of PAF. She has had 2 episodes of post operative atrial fibrillation, the first in 2017 and most recently in June after undergoing hip replacement. Each occurrence has been short lived. She denies any recurrent afib outside of surgery. Each episode, she was symptomatic with palpitations, so she is aware when she goes out of rhythm. Her most recent occurrence happened during post op recovery in the PACU after hip surgery and she spontaneously converted back to NSR within a few hours. At time of her fist episode, she underwent stress testing and 2D echo, both of which were normal. Following her fist occurrence, she also wore a outpatient monitor which showed no recurrent afib. Her CHA2DS2 VASc score is 1 for female sex, thus she is on ASA only. She was referred to Dr. Lovena Le in 2017 for evaluation. He also agreed with ASA only and also offered Flecainide, however the patient refused.   She is back to clinic today with her husband. She denies any recurrent breakthrough symptoms since her surgery. RRR on exam today. HR and BP both stable. She has requested that she transfer care to Dr. Irish Lack.   Current Meds  Medication Sig  . ALPRAZolam (XANAX) 0.5 MG tablet Take 0.25 mg by mouth daily as needed for anxiety.   . Biotin w/ Vitamins C & E (HAIR/SKIN/NAILS PO) Take 1 tablet by mouth daily.  . diphenhydramine-acetaminophen (TYLENOL PM) 25-500 MG TABS tablet Take 1 tablet by mouth at bedtime as needed (pain/sleep).  . naproxen (NAPROSYN) 500 MG tablet Take 1 tablet (500 mg total) by mouth 2 (two) times daily with a meal.  . polyethylene glycol powder (GLYCOLAX/MIRALAX) powder Take 0.25 Containers by mouth daily.   .  progesterone (PROMETRIUM) 100 MG capsule Take 100 mg by mouth at bedtime.  Marland Kitchen venlafaxine XR (EFFEXOR-XR) 37.5 MG 24 hr capsule Take 37.5 mg by mouth daily with breakfast.    Allergies  Allergen Reactions  . Betadine [Povidone Iodine] Rash  . Codeine Nausea And Vomiting  . Fentanyl Nausea And Vomiting  . Other     Steroid injections make her hair fall out  . Penicillins Itching    Has patient had a PCN reaction causing immediate rash, facial/tongue/throat swelling, SOB or lightheadedness with hypotension: Yes Has patient had a PCN reaction causing severe rash involving mucus membranes or skin necrosis: No Has patient had a PCN reaction that required hospitalization No Has patient had a PCN reaction occurring within the last 10 years: No If all of the above answers are "NO", then may proceed with Cephalosporin use.   . Tetanus Toxoids Nausea And Vomiting and Other (See Comments)    High fever   Past Medical History:  Diagnosis Date  . A-fib ([place])   . Acquired deafness of left ear    S/P  RESECTION ACOUSTIC NEUROMA  2004  . Acquired facial asymmetry    post surgery  . Dysrhythmia    a fib after surgery  . GERD (gastroesophageal reflux disease)   . Gestational diabetes mellitus    gestastional  . Headache    "weekly" (05/22/2015)  . 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 Hyperlipidemia   .  Hypothyroidism    hx of no meds now  . IC (interstitial cystitis)   . Migraine    "q several months" (05/22/2015)  . PONV (postoperative nausea and vomiting)    atrial fib  . Syncope    Family History  Problem Relation Age of Onset  . Adopted: Yes   Past Surgical History:  Procedure Laterality Date  . ACOUSTIC NEUROMA RESECTION Left 2004  . BRAIN SURGERY    . CATARACT EXTRACTION W/ INTRAOCULAR LENS IMPLANT Right 01/2014  . COLONOSCOPY  11/2014  . CYSTO WITH HYDRODISTENSION N/A 05/18/2015   Procedure: CYSTOSCOPY/HYDRODISTENSION;  Surgeon:  Carolan Clines, MD;  Location[facility];  Service: Urology;  Laterality: N/A;  . CYSTO/  HYDRODISTENTION/  INSTILLATION THERAPY  x4  last one 05-16-2010   since 1990's  . ESOPHAGOGASTRODUODENOSCOPY  11/2014  . HEMANGIOMA EXCISION Left 2005    FACIAL ANGIOMA REMOVAL / MASTOIDECTOMY  . HEMORRHOID BANDING  05/2015  . MASTOIDECTOMY Left 2005   LEFT FACIAL ANGIOMA REMOVAL /  LEFT MASTOIDECTOMY [Other]  . TOTAL HIP ARTHROPLASTY Left 10/24/2016   Procedure: LEFT TOTAL HIP ARTHROPLASTY ANTERIOR APPROACH;  Surgeon: Mcarthur Rossetti, MD;  Location: [facility];  Service: Orthopedics;  Laterality: Left;  . [place]   "all 4"   Social History   Social History  . Marital status: Legally Separated    Spouse name: N/A  . Number of children: N/A  . Years of education: N/A   Occupational History  . Not on file.   Social History Main Topics  . Smoking status: Never Smoker  . Smokeless tobacco: Never Used  . Alcohol use 0.6 oz/week    1 Glasses of wine per week     Comment: rare  . Drug use: No  . Sexual activity: Not Currently    Birth control/ protection: None   Other Topics Concern  . Not on file   Social History Narrative  . No narrative on file     Review of Systems: General: negative for chills, fever, night sweats or weight changes.  Cardiovascular: negative for chest pain, dyspnea on exertion, edema, orthopnea, palpitations, paroxysmal nocturnal dyspnea or shortness of breath Dermatological: negative for rash Respiratory: negative for cough or wheezing Urologic: negative for hematuria Abdominal: negative for nausea, vomiting, diarrhea, bright red blood per rectum, melena, or hematemesis Neurologic: negative for visual changes, syncope, or dizziness All other systems reviewed and are otherwise negative except as noted above.   Physical Exam:  Blood pressure 110/62, pulse 71, height 5\' 11"  (1.803 m), weight 181 lb 12.8 oz (82.5 kg).    General appearance: alert, cooperative and no distress Neck: no carotid bruit and no JVD Lungs: clear to auscultation bilaterally Heart: regular rate and rhythm, S1, S2 normal, no murmur, click, rub or gallop Extremities: extremities normal, atraumatic, no cyanosis or edema Pulses: 2+ and symmetric Skin: Skin color, texture, turgor normal. No rashes or lesions Neurologic: Grossly normal  EKG not performed RRR on exam -- personally reviewed   ASSESSMENT AND PLAN:   1. PAF: only 2 documented occurrences, both immediately following surgery. She can tell when she is out of rhythm and She denies any recurrent breakthrough symptoms since her last surgery. RRR on exam today. HR and BP both stable. CHA2DS2 VASc score is 1, thus we will continue ASA only. She was seen in past by Dr. Lovena Le and declined Flecainide. We discussed PRN metoprolol, in the event that she experiences breakthrough symptoms/ increased  HR. She is in agreement with this. Will order 25 mg tablets of metoprolol PRN for afib/ tachycardia. She was instructed to avoid triggers (caffieen and ETOH). F/u in 6 months with Dr. Irish Lack.     Tonio Seider Ladoris Gene, MHS [facility] 12/26/2016 4:56 PM

## 2017-01-05 ENCOUNTER — Other Ambulatory Visit (INDEPENDENT_AMBULATORY_CARE_PROVIDER_SITE_OTHER): Payer: Self-pay | Admitting: Orthopaedic Surgery

## 2017-01-06 MED FILL — PROGESTERONE 100 MG CAPSULE: 100 | 30 days supply | Qty: 30 | Fill #7

## 2017-01-09 MED FILL — METHOCARBAMOL 500 MG TABLET: 500 | 15 days supply | Qty: 60 | Fill #0

## 2017-01-15 ENCOUNTER — Ambulatory Visit (INDEPENDENT_AMBULATORY_CARE_PROVIDER_SITE_OTHER): Payer: PRIVATE HEALTH INSURANCE | Admitting: Orthopaedic Surgery

## 2017-01-15 DIAGNOSIS — Z96642 Presence of left artificial hip joint: Secondary | ICD-10-CM

## 2017-01-15 MED ORDER — GABAPENTIN 300 MG PO CAPS: 300.0000 mg | ORAL_CAPSULE | Freq: Every day | ORAL | 0 refills | Status: DC

## 2017-01-15 MED FILL — GABAPENTIN 300 MG CAPSULE: 300 | 30 days supply | Qty: 30 | Fill #0

## 2017-01-15 NOTE — Progress Notes (Signed)
The patient is still following up at his post a left total hip arthroplasty. This was done due to posttraumatic labral tearing and arthritic changes in her hip. Prior to her injury she has not had any hip problems at all. Her hip pain is been severe prior surgery and is still severe after surgery. She's been trying everything she can from using a cane and going to physical therapy. She is someone who does not tolerate a lot of medications socially takes half a methocarbamol and half on naproxen due to the detrimental effects as on her body. She has pain with mobility and weightbearing and with motion of the hip.  On examination her hip incision is well-healed. I can move the hip through internal rotation rotation but her pain is severe mainly of the trochanteric area down the IT band as well. It does radiate to the groin as well.  At this point I will try steroid injection of the of the trochanteric area. I'm also going to put her on Neurontin 300 mg to take at bedtime. I like see her back in just 2 weeks and I would like a low AP pelvis and lateral of her left operative hip.

## 2017-01-16 MED FILL — NAPROXEN 500 MG TABLET: 500 | 30 days supply | Qty: 60 | Fill #1

## 2017-01-29 ENCOUNTER — Ambulatory Visit (INDEPENDENT_AMBULATORY_CARE_PROVIDER_SITE_OTHER): Payer: PRIVATE HEALTH INSURANCE | Admitting: Orthopaedic Surgery

## 2017-02-09 MED FILL — PROGESTERONE 100 MG CAPSULE: 100 | 30 days supply | Qty: 30 | Fill #8

## 2017-02-12 ENCOUNTER — Other Ambulatory Visit (INDEPENDENT_AMBULATORY_CARE_PROVIDER_SITE_OTHER): Payer: Self-pay | Admitting: Orthopaedic Surgery

## 2017-02-12 MED FILL — GABAPENTIN 300 MG CAPSULE: 300 | 30 days supply | Qty: 30 | Fill #0

## 2017-02-12 NOTE — Telephone Encounter (Signed)
please advise.

## 2017-02-24 ENCOUNTER — Telehealth (INDEPENDENT_AMBULATORY_CARE_PROVIDER_SITE_OTHER): Payer: Self-pay | Admitting: Orthopaedic Surgery

## 2017-02-24 NOTE — Telephone Encounter (Signed)
Scheduled

## 2017-02-24 NOTE — Telephone Encounter (Signed)
Please schedule

## 2017-02-24 NOTE — Telephone Encounter (Signed)
OK 

## 2017-02-24 NOTE — Telephone Encounter (Signed)
Patient states Dr. Ninfa Linden operated on her left hip 4 months ago and patient's husband spoke to Dr. Durward Fortes yesterday about a second opinion for the post op pain that patient is experiencing. Ok to schedule?

## 2017-02-24 NOTE — Telephone Encounter (Signed)
Please advise 

## 2017-02-25 MED FILL — VENLAFAXINE HCL ER 37.5 MG: 37.5 | 30 days supply | Qty: 60 | Fill #5

## 2017-03-09 ENCOUNTER — Encounter (INDEPENDENT_AMBULATORY_CARE_PROVIDER_SITE_OTHER): Payer: Self-pay | Admitting: Orthopaedic Surgery

## 2017-03-09 ENCOUNTER — Ambulatory Visit (INDEPENDENT_AMBULATORY_CARE_PROVIDER_SITE_OTHER): Payer: PRIVATE HEALTH INSURANCE | Admitting: Orthopaedic Surgery

## 2017-03-09 ENCOUNTER — Ambulatory Visit (INDEPENDENT_AMBULATORY_CARE_PROVIDER_SITE_OTHER): Payer: PRIVATE HEALTH INSURANCE

## 2017-03-09 VITALS — BP 134/59 | HR 70 | Resp 12 | Ht 70.5 in | Wt 185.0 lb

## 2017-03-09 DIAGNOSIS — Z96642 Presence of left artificial hip joint: Secondary | ICD-10-CM | POA: Diagnosis not present

## 2017-03-09 MED FILL — PROGESTERONE 100 MG CAPSULE: 100 | 30 days supply | Qty: 30 | Fill #9

## 2017-03-09 NOTE — Progress Notes (Signed)
Office Visit Note   Patient: Kristin Bradford           Date of Birth: Oct 06, 1955           MRN: [id number] Visit Date: 03/09/2017              Requested by: Mayra Neer, MD [address] [place], [place] [address] PCP: Mayra Neer, MD   Assessment & Plan: Visit Diagnoses:  1. Presence of left artificial hip joint   2. Status post left hip replacement   Painful left total hip replacement with multiple diagnostic possibilities  Plan: Resume physical therapy, restart gabapentin 300 mg by mouth daily at bedtime, MRI scan with MARS  technique left hip. Left hip aspiration to rule out infection and consider a lidacane injection post aspiration. Had recent urinary tract infection. Believe it lab work at this point might be skewed  Follow-Up Instructions: Return if symptoms worsen or fail to improve, for after MRI left hip.   Orders:  Orders Placed This Encounter  Procedures  . XR Pelvis 1-2 Views  . MR Hip Left w/o contrast  . Ambulatory referral to Physical Medicine Rehab   No orders of the defined types were placed in this encounter.     Procedures: No procedures performed   Clinical Data: No additional findings.   Subjective: Chief Complaint  Patient presents with  . Left Hip - Pain    Kristin Bradford is a 61 y o that is here for a second opinion about her L THA on May 25,2018 by Dr. Ninfa Linden. She feels as if she is worse off that before the surgery  [name] is 59 months status post primary, uncomplicated left total hip replacement by Dr. Ninfa Linden. She relates that she continues to have significant pain to the point where she has ambulatory compromise. She denies any fever or chills. However, she's had a kidney stone in 2 urinary tract infections over the past month or 2. She just recently finished another course of antibiotics for Citrobacter UTI. She's not had any injuries. Her pain is along the groin into her thigh any prolonged a lateral aspect of her  hip. Dr. Ninfa Linden thought she might have a greater trochanteric bursitis and injected that area. She's not sure it made any difference. She has been to physical therapy notes that while she was enrolled in therapy she was having a little less pain. She was also started on gabapentin 3 into milligrams at night which she also fell was helping but then she "stop taking it". She's not had any numbness or tingling distally but is noted some numbness along the anterior lateral thigh. She did not have any wound issues after surgery. She's not any drainage or redness or erythema. She is really not experiencing any buttock pain or back discomfort. Initial onset of her left hip pain was after she had a fall behind [facility]. She had seen Dr. stops at [facility] to thought that the tear of the labrum was not "fixable". She subsequently had her hip replacement by Dr. Ninfa Linden. There is still some litigation involved with her fall. X-rays today in the office revealed excellent position of the components without complicating factors no evidence of periosteal elevation or lucency around any of the components. Components are in excellent position HPI  Review of Systems  Constitutional: Negative for chills, fatigue and fever.  Eyes: Negative for itching.  Respiratory: Negative for chest tightness and shortness of breath.  Cardiovascular: Negative for chest pain, palpitations and leg swelling.  Gastrointestinal: Negative for blood in stool, constipation and diarrhea.  Endocrine: Negative for polyuria.  Genitourinary: Negative for dysuria.  Musculoskeletal: Negative for back pain, joint swelling, neck pain and neck stiffness.  Allergic/Immunologic: Negative for immunocompromised state.  Neurological: Negative for dizziness and numbness.  Hematological: Does not bruise/bleed easily.  Psychiatric/Behavioral: The patient is not nervous/anxious.      Objective: Vital Signs: BP (!) 134/59   Pulse 70   Resp  12   Ht 5' 10.5" (1.791 m)   Wt 185 lb (83.9 kg)   BMI 26.17 kg/m   Physical Exam  Ortho Exam awake alert and oriented 3. Comfortable sitting. Obvious discomfort when she bears weight on her left lower extremity. Walks with an unusual gait swinging her left leg in an arc for comfort. Pelvis appears to be level. Wounds are healing without evidence of infection. No thigh swelling. No knee pain. Neurovascular exam intact distally no calf pain. Considerable pain with internal/external rotation of her left hip beyond about 5 also having some pain with flexion and extension left hip  Specialty Comments:  No specialty comments available.  Imaging: Xr Pelvis 1-2 Views  Result Date: 03/09/2017 AP pelvis demonstrates excellent position of a left total hip replacement. No ectopic calcification. The acetabulum is well positioned. Femoral head is nicely positioned within the acetabulum. No evidence of fracture.    PMFS History: Patient Active Problem List   Diagnosis Date Noted  . 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 Chest pain 10/24/2016  . Unilateral primary osteoarthritis, left hip 10/06/2016  . Faintness   . Syncope 05/21/2015  . Nausea and vomiting 05/21/2015  . Diarrhea 05/21/2015  . Gastroenteritis 05/21/2015  . IC (interstitial cystitis)   . Atrial fibrillation, new onset ([place]) 05/18/2015  . Atrial fibrillation ([place]) 05/18/2015  . Hypothyroidism 05/18/2015  . Interstitial cystitis 05/18/2015  . Acoustic neuroma ([place]) 05/18/2015   Past Medical History:  Diagnosis Date  . A-fib ([place])   . Acquired deafness of left ear    S/P  RESECTION ACOUSTIC NEUROMA  2004  . Acquired facial asymmetry    post surgery  . Dysrhythmia    a fib after surgery  . GERD (gastroesophageal reflux disease)   . Gestational diabetes mellitus    gestastional  . Headache    "weekly" (05/22/2015)  . 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  Hyperlipidemia   . Hypothyroidism    hx of no meds now  . IC (interstitial cystitis)   . Migraine    "q several months" (05/22/2015)  . PONV (postoperative nausea and vomiting)    atrial fib  . Syncope     Family History  Problem Relation Age of Onset  . Adopted: Yes    Past Surgical History:  Procedure Laterality Date  . ACOUSTIC NEUROMA RESECTION Left 2004  . BRAIN SURGERY    . CATARACT EXTRACTION W/ INTRAOCULAR LENS IMPLANT Right 01/2014  . COLONOSCOPY  11/2014  . CYSTO WITH HYDRODISTENSION N/A 05/18/2015   Procedure: CYSTOSCOPY/HYDRODISTENSION;  Surgeon: Carolan Clines, MD;  Location: [facility];  Service: Urology;  Laterality: N/A;  . CYSTO/  HYDRODISTENTION/  INSTILLATION THERAPY  x4  last one 05-16-2010   since 1990's  . ESOPHAGOGASTRODUODENOSCOPY  11/2014  . HEMANGIOMA EXCISION Left 2005    FACIAL ANGIOMA REMOVAL / MASTOIDECTOMY  . HEMORRHOID BANDING  05/2015  . MASTOIDECTOMY  Left 2005   LEFT FACIAL ANGIOMA REMOVAL /  LEFT MASTOIDECTOMY [Other]  . TOTAL HIP ARTHROPLASTY Left 10/24/2016   Procedure: LEFT TOTAL HIP ARTHROPLASTY ANTERIOR APPROACH;  Surgeon: Mcarthur Rossetti, MD;  Location: [facility];  Service: Orthopedics;  Laterality: Left;  . [place]   "all 4"   Social History   Occupational History  . Not on file.   Social History Main Topics  . Smoking status: Never Smoker  . Smokeless tobacco: Never Used  . Alcohol use 0.6 oz/week    1 Glasses of wine per week     Comment: rare  . Drug use: No  . Sexual activity: Not Currently    Birth control/ protection: None

## 2017-03-11 ENCOUNTER — Ambulatory Visit (INDEPENDENT_AMBULATORY_CARE_PROVIDER_SITE_OTHER): Payer: PRIVATE HEALTH INSURANCE

## 2017-03-11 ENCOUNTER — Encounter (INDEPENDENT_AMBULATORY_CARE_PROVIDER_SITE_OTHER): Payer: Self-pay | Admitting: Physical Medicine and Rehabilitation

## 2017-03-11 ENCOUNTER — Ambulatory Visit (INDEPENDENT_AMBULATORY_CARE_PROVIDER_SITE_OTHER): Payer: PRIVATE HEALTH INSURANCE | Admitting: Physical Medicine and Rehabilitation

## 2017-03-11 VITALS — BP 112/59 | Temp 98.2°F

## 2017-03-11 DIAGNOSIS — M25552 Pain in left hip: Secondary | ICD-10-CM | POA: Diagnosis not present

## 2017-03-11 MED ORDER — BUPIVACAINE HCL 0.5 % IJ SOLN
3.0000 mL | INTRAMUSCULAR | Status: AC | PRN
  Administered 2017-03-11: 3 mL via INTRA_ARTICULAR

## 2017-03-11 MED ORDER — LIDOCAINE HCL 2 % IJ SOLN
4.0000 mL | INTRAMUSCULAR | Status: AC | PRN
  Administered 2017-03-11: 4 mL

## 2017-03-11 NOTE — Patient Instructions (Signed)

## 2017-03-11 NOTE — Progress Notes (Signed)
Kristin Bradford - 61 y.o. female MRN [id number]  Date of birth: 02/28/1956  Office Visit Note: Visit Date: 03/11/2017 PCP: Mayra Neer, MD Referred by: Mayra Neer, MD  Subjective: No chief complaint on file.  HPI: Kristin Bradford is a 61 year old female with left total hip arthroplasty with continued worsening hip pain. She is approximately 5 months out from surgery is still having a lot of pain almost worsening pain even before the surgery. She feels like she is getting give way weakness in the left hip. Hip surgery was performed by Dr. Ninfa Linden. Dr. Durward Fortes saw her for second opinion and didn't want aspiration and possible lidocaine injection.    ROS Otherwise per HPI.  Assessment & Plan: Visit Diagnoses:  1. Pain in left hip     Plan: Findings:  Fluoroscopically guided injection to the prosthetic neck was uneventful. Upon touching the prosthetic bag the patient did have quite a bit of pain. We did aspirate around this area with no fluid aspirated. I did try to irrigate with saline and the irrigation of saline along the prosthetic neck causes severe pain. At that point I tried to put lidocaine in the area along the femoral neck and again the lidocaine injectate gave her severe pain when injected along the prosthetic neck. She did not get relief during the anesthetic phase.    Meds & Orders: No orders of the defined types were placed in this encounter.   Orders Placed This Encounter  Procedures  . Large Joint Injection/Arthrocentesis  . XR C-ARM NO REPORT    Follow-up: No Follow-up on file.   Procedures: Left total hip arthroplasty injection with fluoroscopic guidance and aspiration Date/Time: 03/11/2017 1:32 PM Performed by: Magnus Sinning Authorized by: Magnus Sinning   Consent Given by:  Patient Site marked: the procedure site was marked   Timeout: prior to procedure the correct patient, procedure, and site was verified   Indications:  Pain and diagnostic  evaluation Location:  Hip Site:  L hip joint Prep: patient was prepped and draped in usual sterile fashion   Needle gauge: 20-gauge Touhy. Needle Length:  3.5 inches Approach:  Anterior Ultrasound Guidance: No   Fluoroscopic Guidance: Yes   Arthrogram: No   Medications:  3 mL bupivacaine 0.5 %; 4 mL lidocaine 2 % Aspiration Attempted: Yes   Aspirate amount (mL):  0 Patient tolerance:  Patient tolerated the procedure well with no immediate complications  Patient had extreme amount of pain with touching of the prosthetic femoral neck and subsequent pain was trying to inject on top of the femoral neck with lidocaine and saline. There was no aspiration of fluid.      No notes on file   Clinical History: No specialty comments available.  She reports that she has never smoked. She has never used smokeless tobacco. No results for input(s): HGBA1C, LABURIC in the last 8760 hours.  Objective:  VS:  HT:    WT:   BMI:     BP:(!) 112/59  HR: bpm  TEMP:98.2 F (36.8 C)(Oral)  RESP:  Physical Exam  Musculoskeletal:  Left hip shows well-healed anterior scar. She does ambulate without a period    Ortho Exam Imaging: No results found.  Past Medical/Family/Surgical/Social History: Medications & Allergies reviewed per EMR Patient Active Problem List   Diagnosis Date Noted  . 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 Chest pain 10/24/2016  . Unilateral primary osteoarthritis, left hip 10/06/2016  . Faintness   . Syncope  05/21/2015  . Nausea and vomiting 05/21/2015  . Diarrhea 05/21/2015  . Gastroenteritis 05/21/2015  . IC (interstitial cystitis)   . Atrial fibrillation, new onset ([place]) 05/18/2015  . Atrial fibrillation ([place]) 05/18/2015  . Hypothyroidism 05/18/2015  . Interstitial cystitis 05/18/2015  . Acoustic neuroma ([place]) 05/18/2015   Past Medical History:  Diagnosis Date  . A-fib ([place])   . Acquired deafness of left ear    S/P  RESECTION ACOUSTIC NEUROMA   2004  . Acquired facial asymmetry    post surgery  . Dysrhythmia    a fib after surgery  . GERD (gastroesophageal reflux disease)   . Gestational diabetes mellitus    gestastional  . Headache    "weekly" (05/22/2015)  . 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 Hyperlipidemia   . Hypothyroidism    hx of no meds now  . IC (interstitial cystitis)   . Migraine    "q several months" (05/22/2015)  . PONV (postoperative nausea and vomiting)    atrial fib  . Syncope    Family History  Problem Relation Age of Onset  . Adopted: Yes   Past Surgical History:  Procedure Laterality Date  . ACOUSTIC NEUROMA RESECTION Left 2004  . BRAIN SURGERY    . CATARACT EXTRACTION W/ INTRAOCULAR LENS IMPLANT Right 01/2014  . COLONOSCOPY  11/2014  . CYSTO WITH HYDRODISTENSION N/A 05/18/2015   Procedure: CYSTOSCOPY/HYDRODISTENSION;  Surgeon: Carolan Clines, MD;  Location: [facility];  Service: Urology;  Laterality: N/A;  . CYSTO/  HYDRODISTENTION/  INSTILLATION THERAPY  x4  last one 05-16-2010   since 1990's  . ESOPHAGOGASTRODUODENOSCOPY  11/2014  . HEMANGIOMA EXCISION Left 2005    FACIAL ANGIOMA REMOVAL / MASTOIDECTOMY  . HEMORRHOID BANDING  05/2015  . MASTOIDECTOMY Left 2005   LEFT FACIAL ANGIOMA REMOVAL /  LEFT MASTOIDECTOMY [Other]  . TOTAL HIP ARTHROPLASTY Left 10/24/2016   Procedure: LEFT TOTAL HIP ARTHROPLASTY ANTERIOR APPROACH;  Surgeon: Mcarthur Rossetti, MD;  Location: [facility];  Service: Orthopedics;  Laterality: Left;  . [place]   "all 4"   Social History   Occupational History  . Not on file.   Social History Main Topics  . Smoking status: Never Smoker  . Smokeless tobacco: Never Used  . Alcohol use 0.6 oz/week    1 Glasses of wine per week     Comment: rare  . Drug use: No  . Sexual activity: Not Currently    Birth control/ protection: None

## 2017-03-11 NOTE — Progress Notes (Deleted)
Pt here to aspirate left hip, been 5 months since her surgery and still very painful, and weak would have to wait a min before she begins to walk due to feeling like it gives away, keeps her up at night. Referred by Dr. Durward Fortes.

## 2017-03-16 ENCOUNTER — Other Ambulatory Visit (INDEPENDENT_AMBULATORY_CARE_PROVIDER_SITE_OTHER): Payer: Self-pay | Admitting: Physician Assistant

## 2017-03-16 MED FILL — NAPROXEN 500 MG TABLET: 500 | 30 days supply | Qty: 60 | Fill #0

## 2017-03-20 ENCOUNTER — Ambulatory Visit
Admission: RE | Admit: 2017-03-20 | Discharge: 2017-03-20 | Disposition: A | Discharge status: Home / Self Care | Payer: PRIVATE HEALTH INSURANCE | Source: Ambulatory Visit | Attending: Orthopaedic Surgery | Admitting: Orthopaedic Surgery

## 2017-03-20 DIAGNOSIS — Z96642 Presence of left artificial hip joint: Secondary | ICD-10-CM

## 2017-03-23 ENCOUNTER — Ambulatory Visit (INDEPENDENT_AMBULATORY_CARE_PROVIDER_SITE_OTHER): Payer: PRIVATE HEALTH INSURANCE | Admitting: Orthopaedic Surgery

## 2017-03-23 ENCOUNTER — Encounter (INDEPENDENT_AMBULATORY_CARE_PROVIDER_SITE_OTHER): Payer: Self-pay | Admitting: Orthopaedic Surgery

## 2017-03-23 VITALS — BP 117/66 | HR 67 | Resp 16 | Ht 71.0 in | Wt 200.0 lb

## 2017-03-23 DIAGNOSIS — Z96642 Presence of left artificial hip joint: Secondary | ICD-10-CM | POA: Diagnosis not present

## 2017-03-23 NOTE — Progress Notes (Signed)
Office Visit Note   Patient: Kristin Bradford           Date of Birth: [birth year]           MRN: [id number] Visit Date: 03/23/2017              Requested by: Kristin Neer, MD [address] [place],  [address] PCP: Kristin Neer, MD   Assessment & Plan: Visit Diagnoses:  1. Presence of left artificial hip joint   2. Status post left hip replacement     Plan: Painful left total hip replacement that appears to be related to worsening along the femoral stem .hip aspiration was negative. We'll obtain CBC sedimentation rate and C-reactive protein. On discussion regarding MRI scan findings and how best to proceed. I will discuss with Kristin Bradford and call Kristin Bradford  Follow-Up Instructions: Return if symptoms worsen or fail to improve.   Orders:  Orders Placed This Encounter  Procedures  . C-reactive protein  . CBC with Differential  . Sed Rate (ESR)   No orders of the defined types were placed in this encounter.     Procedures: No procedures performed   Clinical Data: No additional findings.   Subjective: Chief Complaint  Patient presents with  . Left Hip - Results    Kristin Bradford is a 61 y o here for MRI results of Left hip.  Painful left total hip replacement 6 months as previously outlined in the office note for MRI scan performed demonstrating some radiolucency along the femoral stem consistent with loosening. Kristin Bradford performed a hip aspiration. No fluid retrieved. Continues to have pain with weightbearing tickly with twisting in the area of the groin and anterior thigh  HPI  Review of Systems  Constitutional: Negative for chills, fatigue and fever.  Eyes: Negative for itching.  Respiratory: Negative for chest tightness and shortness of breath.   Cardiovascular: Negative for chest pain, palpitations and leg swelling.  Gastrointestinal: Negative for blood in stool, constipation and diarrhea.  Endocrine: Negative for polyuria.    Genitourinary: Negative for dysuria.  Musculoskeletal: Positive for back pain. Negative for joint swelling, neck pain and neck stiffness.  Allergic/Immunologic: Negative for immunocompromised state.  Neurological: Positive for light-headedness. Negative for dizziness and numbness.  Hematological: Does not bruise/bleed easily.  Psychiatric/Behavioral: The patient is not nervous/anxious.      Objective: Vital Signs: BP 117/66   Pulse 67   Resp 16   Ht _0  (1.803 m)   Wt 200 lb (90.7 kg)   BMI 27.89 kg/m   Physical Exam  Ortho Exam wake alert and oriented 3 comfortable sitting. Not much pain today with weightbearing but does have some an with internal/external rotation of her left hip. No induration. No swelling distally. No increased heat around the incision. Straight leg raise negative bilaterally.   Imaging: No results found.   PMFS History: Patient Active Problem List   Diagnosis Date Noted  . 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 Chest pain 10/24/2016  . Unilateral primary osteoarthritis, left hip 10/06/2016  . Faintness   . Syncope 05/21/2015  . Nausea and vomiting 05/21/2015  . Diarrhea 05/21/2015  . Gastroenteritis 05/21/2015  . IC (interstitial cystitis)   . Atrial fibrillation, new onset ([place]) 05/18/2015  . Atrial fibrillation ([place]) 05/18/2015  . Hypothyroidism 05/18/2015  . Interstitial cystitis 05/18/2015  . Acoustic neuroma ([place]) 05/18/2015   Past Medical History:  Diagnosis Date  . A-fib ([place])   .  Acquired deafness of left ear    S/P  RESECTION ACOUSTIC NEUROMA  2004  . Acquired facial asymmetry    post surgery  . Dysrhythmia    a fib after surgery  . GERD (gastroesophageal reflux disease)   . Gestational diabetes mellitus    gestastional  . Headache    "weekly" (05/22/2015)  . 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 Hyperlipidemia   . Hypothyroidism    hx of no meds now  . IC  (interstitial cystitis)   . Migraine    "q several months" (05/22/2015)  . PONV (postoperative nausea and vomiting)    atrial fib  . Syncope     Family History  Problem Relation Age of Onset  . Adopted: Yes    Past Surgical History:  Procedure Laterality Date  . ACOUSTIC NEUROMA RESECTION Left 2004  . BRAIN SURGERY    . CATARACT EXTRACTION W/ INTRAOCULAR LENS IMPLANT Right 01/2014  . COLONOSCOPY  11/2014  . CYSTO WITH HYDRODISTENSION N/A 05/18/2015   Procedure: CYSTOSCOPY/HYDRODISTENSION;  Surgeon: Kristin Clines, MD;  Location[facility];  Service: Urology;  Laterality: N/A;  . CYSTO/  HYDRODISTENTION/  INSTILLATION THERAPY  x4  last one 05-16-2010   since 1990's  . ESOPHAGOGASTRODUODENOSCOPY  11/2014  . HEMANGIOMA EXCISION Left 2005    FACIAL ANGIOMA REMOVAL / MASTOIDECTOMY  . HEMORRHOID BANDING  05/2015  . MASTOIDECTOMY Left 2005   LEFT FACIAL ANGIOMA REMOVAL /  LEFT MASTOIDECTOMY [Other]  . TOTAL HIP ARTHROPLASTY Left 10/24/2016   Procedure: LEFT TOTAL HIP ARTHROPLASTY ANTERIOR APPROACH;  Surgeon: Kristin Rossetti, MD;  Location: [facility];  Service: Orthopedics;  Laterality: Left;  . [place]   "all 4"   Social History   Occupational History  . Not on file.   Social History Main Topics  . Smoking status: Never Smoker  . Smokeless tobacco: Never Used  . Alcohol use 0.6 oz/week    1 Glasses of wine per week     Comment: rare  . Drug use: No  . Sexual activity: Not Currently    Birth control/ protection: None

## 2017-03-24 LAB — CBC WITH DIFFERENTIAL/PLATELET
Basophils Absolute: 60 cells/uL (ref 0–200)
Basophils Relative: 1.2 %
EOS ABS: 130 {cells}/uL (ref 15–500)
Eosinophils Relative: 2.6 %
HCT: 36.4 % (ref 35.0–45.0)
Hemoglobin: 12.1 g/dL (ref 11.7–15.5)
Lymphs Abs: 1625 cells/uL (ref 850–3900)
MCH: 28.1 pg (ref 27.0–33.0)
MCHC: 33.2 g/dL (ref 32.0–36.0)
MCV: 84.5 fL (ref 80.0–100.0)
MPV: 9.8 fL (ref 7.5–12.5)
Monocytes Relative: 9.3 %
NEUTROS PCT: 54.4 %
Neutro Abs: 2720 cells/uL (ref 1500–7800)
PLATELETS: 297 10*3/uL (ref 140–400)
RBC: 4.31 10*6/uL (ref 3.80–5.10)
RDW: 13.4 % (ref 11.0–15.0)
TOTAL LYMPHOCYTE: 32.5 %
WBC: 5 10*3/uL (ref 3.8–10.8)
WBCMIX: 465 {cells}/uL (ref 200–950)

## 2017-03-24 LAB — C-REACTIVE PROTEIN: CRP: 1.4 mg/L (ref <8.0)

## 2017-03-24 LAB — SEDIMENTATION RATE: Sed Rate: 22 mm/h (ref 0–30)

## 2017-03-26 ENCOUNTER — Telehealth (INDEPENDENT_AMBULATORY_CARE_PROVIDER_SITE_OTHER): Payer: Self-pay | Admitting: Orthopaedic Surgery

## 2017-03-26 NOTE — Telephone Encounter (Signed)
Called pt, wants to speak with you regarding last message.  [place] OK to leave message

## 2017-03-26 NOTE — Telephone Encounter (Signed)
Patient calling in reference to hip revision. Patient does want to proceed with 2nd opinion, and surgery due to pain.

## 2017-03-26 NOTE — Telephone Encounter (Signed)
Please advise 

## 2017-03-26 NOTE — Telephone Encounter (Signed)
Think pt would like to see a joint replacement surgeon at [facility] call and if so make referral-thanks

## 2017-03-27 NOTE — Telephone Encounter (Signed)
Make appt to see Dr Ninfa Linden next week-painful left THR-see chart for details

## 2017-03-27 NOTE — Telephone Encounter (Signed)
See note below

## 2017-04-01 ENCOUNTER — Telehealth (INDEPENDENT_AMBULATORY_CARE_PROVIDER_SITE_OTHER): Payer: Self-pay | Admitting: Orthopaedic Surgery

## 2017-04-01 NOTE — Telephone Encounter (Signed)
Patient called stating she has left messages and has not heard from Dr. Trevor Mace surgery scheduler and wanted to know if you could follow up for her.  CB#[phone] or [phone].  Thank you.

## 2017-04-02 NOTE — Telephone Encounter (Signed)
See [place]'s note.

## 2017-04-02 NOTE — Telephone Encounter (Signed)
Made her an appt for next week

## 2017-04-02 NOTE — Telephone Encounter (Signed)
See below

## 2017-04-02 NOTE — Telephone Encounter (Signed)
Would you please check to see if this has been scheduled?  Thanks, Marcie Bal

## 2017-04-06 MED FILL — PREDNISOLONE AC 1% EYE DROP: 1 | 12 days supply | Qty: 5 | Fill #0

## 2017-04-06 MED FILL — MOXIFLOXACIN 0.5% EYE DROPS: 0.5 | 15 days supply | Qty: 3 | Fill #0

## 2017-04-08 ENCOUNTER — Encounter (INDEPENDENT_AMBULATORY_CARE_PROVIDER_SITE_OTHER): Payer: Self-pay | Admitting: Orthopaedic Surgery

## 2017-04-08 ENCOUNTER — Ambulatory Visit (INDEPENDENT_AMBULATORY_CARE_PROVIDER_SITE_OTHER): Payer: PRIVATE HEALTH INSURANCE | Admitting: Orthopaedic Surgery

## 2017-04-08 ENCOUNTER — Other Ambulatory Visit (INDEPENDENT_AMBULATORY_CARE_PROVIDER_SITE_OTHER): Payer: Self-pay | Admitting: Orthopaedic Surgery

## 2017-04-08 DIAGNOSIS — Z96642 Presence of left artificial hip joint: Secondary | ICD-10-CM

## 2017-04-08 DIAGNOSIS — M25552 Pain in left hip: Secondary | ICD-10-CM | POA: Diagnosis not present

## 2017-04-08 MED ORDER — GABAPENTIN 300 MG PO CAPS: 300.0000 mg | ORAL_CAPSULE | Freq: Every day | ORAL | 1 refills | Status: DC

## 2017-04-08 MED FILL — PROGESTERONE 100 MG CAPSULE: 100 | 30 days supply | Qty: 30 | Fill #10

## 2017-04-08 MED FILL — GABAPENTIN 300 MG CAPSULE: 300 | 30 days supply | Qty: 30 | Fill #0

## 2017-04-08 NOTE — Telephone Encounter (Signed)
Please advise 

## 2017-04-08 NOTE — Progress Notes (Signed)
The patient is well-known to me.  She is very pleasant 61 year old female who is almost 6 months status post a left total hip arthroplasty through direct anterior approach.  Her hip was replaced after having a traumatic labral tear that worsened arthritic changes in the superior lateral aspect of her hip.  We performed the surgery in May of this year and she has had significant pain ever since the surgery.  We will work on activity modification and extensive physical therapy.  Recently she has had plain films and even an MRI of that hip and were going to review this today.  She has had a blood workup including a sed rate, CRP and a peripheral white blood cell count which were all normal and not indicative of infection.  She feels like it is a deep pain and is also on the lateral aspect of her hip.  She gets some burning sensations with it as well she does take Neurontin at night.  On exam her incisions healed nicely.  There is no redness of the skin and no induration.  She does not have a positive Tinel sign over the lateral femoral cutaneous nerve.  Some of her numbness is more posterior to the incision.  This does not appear to be a complex regional pain syndrome or a reflex sympathetic dystrophy.  Her pain certainly seems to be more mechanical in nature.  When I had her lay flat examining her leg lengths she may be just a few millimeters short on her operative left side than the right but is barely negligible on exam.  I was able to review the MRI with her as well as plain films.  There is lucency around the proximal aspect of the femoral component suggesting it is not going into her bone yet.  This is a femoral component with hydroxy appetite (HA) coding this slowly goes in the bone for over 6 months to a year.  On plain films you can see this lucency compared to films from June of this year.  There is definitely fluid around this on the MRI just in this area to suggest loosening however this is so early  postoperative.  However, the mechanical pain she is experiencing does point to a potential issue with the prosthesis.  We had a long and thorough discussion about considering revision surgery.  Although her labs are normal and her clinical exam is not indicative of infection, we would still need to be cautious to rule this out with surgery.  From a surgical standpoint if she decides on a revision surgery we will taken to the operating room and assess for infection first and then assess for any gross instability of the joint as well as the femoral component.  If the femoral component is loose we can still revise this to direct anterior approach with up sizing the femoral component.  We had a long and thorough discussion about indications for surgery and deciding whether to do this or not.  Obviously we would prefer to do this sooner than later to decrease the chance of the component coming out more difficulty as it grows into her femur over time.  We talked about the risk of acute blood loss anemia and fracture as well as further infection and dislocation with any type of revision surgery.  She is going to think about this and let me know.  My #1 priority is getting her feeling better and feeling secure with her hip due to significant pain she  has had for some time now even well before the surgery from the traumatic event that she experienced from her fall that led to this.

## 2017-04-25 ENCOUNTER — Other Ambulatory Visit: Payer: Self-pay

## 2017-04-25 ENCOUNTER — Encounter (HOSPITAL_COMMUNITY): Payer: Self-pay

## 2017-04-25 ENCOUNTER — Emergency Department (HOSPITAL_COMMUNITY)
Admission: EM | Admit: 2017-04-25 | Discharge: 2017-04-25 | Disposition: A | Discharge status: Home / Self Care | Payer: PRIVATE HEALTH INSURANCE | Attending: Emergency Medicine | Admitting: Emergency Medicine

## 2017-04-25 DIAGNOSIS — E039 Hypothyroidism, unspecified: Secondary | ICD-10-CM | POA: Insufficient documentation

## 2017-04-25 DIAGNOSIS — Z8679 Personal history of other diseases of the circulatory system: Secondary | ICD-10-CM | POA: Insufficient documentation

## 2017-04-25 DIAGNOSIS — R002 Palpitations: Secondary | ICD-10-CM

## 2017-04-25 DIAGNOSIS — Z880 Allergy status to penicillin: Secondary | ICD-10-CM | POA: Diagnosis not present

## 2017-04-25 DIAGNOSIS — R6884 Jaw pain: Secondary | ICD-10-CM | POA: Diagnosis not present

## 2017-04-25 DIAGNOSIS — Z79899 Other long term (current) drug therapy: Secondary | ICD-10-CM | POA: Insufficient documentation

## 2017-04-25 DIAGNOSIS — Z885 Allergy status to narcotic agent status: Secondary | ICD-10-CM | POA: Insufficient documentation

## 2017-04-25 DIAGNOSIS — R1110 Vomiting, unspecified: Secondary | ICD-10-CM | POA: Diagnosis not present

## 2017-04-25 DIAGNOSIS — I480 Paroxysmal atrial fibrillation: Secondary | ICD-10-CM | POA: Insufficient documentation

## 2017-04-25 LAB — CBC WITH DIFFERENTIAL/PLATELET
Basophils Absolute: 0.1 10*3/uL (ref 0.0–0.1)
Basophils Relative: 1 %
Eosinophils Absolute: 0.1 10*3/uL (ref 0.0–0.7)
Eosinophils Relative: 1 %
HEMATOCRIT: 42.3 % (ref 36.0–46.0)
HEMOGLOBIN: 13.9 g/dL (ref 12.0–15.0)
LYMPHS ABS: 1.4 10*3/uL (ref 0.7–4.0)
Lymphocytes Relative: 23 %
MCH: 28.7 pg (ref 26.0–34.0)
MCHC: 32.9 g/dL (ref 30.0–36.0)
MCV: 87.4 fL (ref 78.0–100.0)
MONO ABS: 0.5 10*3/uL (ref 0.1–1.0)
MONOS PCT: 9 %
NEUTROS ABS: 3.9 10*3/uL (ref 1.7–7.7)
NEUTROS PCT: 66 %
Platelets: 307 10*3/uL (ref 150–400)
RBC: 4.84 MIL/uL (ref 3.87–5.11)
RDW: 13.9 % (ref 11.5–15.5)
WBC: 6 10*3/uL (ref 4.0–10.5)

## 2017-04-25 LAB — I-STAT TROPONIN, ED: TROPONIN I, POC: 0 ng/mL (ref 0.00–0.08)

## 2017-04-25 LAB — BASIC METABOLIC PANEL
Anion gap: 10 (ref 5–15)
BUN: 15 mg/dL (ref 6–20)
CHLORIDE: 106 mmol/L (ref 101–111)
CO2: 23 mmol/L (ref 22–32)
CREATININE: 0.9 mg/dL (ref 0.44–1.00)
Calcium: 9.6 mg/dL (ref 8.9–10.3)
GFR calc Af Amer: >60 mL/min (ref >60)
GFR calc non Af Amer: >60 mL/min (ref >60)
GLUCOSE: 118 mg/dL — AB (ref 65–99)
Potassium: 3.5 mmol/L (ref 3.5–5.1)
Sodium: 139 mmol/L (ref 135–145)

## 2017-04-25 LAB — MAGNESIUM: Magnesium: 2 mg/dL (ref 1.7–2.4)

## 2017-04-25 MED ORDER — SODIUM CHLORIDE 0.9 % IV BOLUS (SEPSIS)
1000.0000 mL | Freq: Once | INTRAVENOUS | Status: AC
  Administered 2017-04-25: 1000 mL via INTRAVENOUS

## 2017-04-25 MED ORDER — LORAZEPAM 1 MG PO TABS
0.5000 mg | ORAL_TABLET | Freq: Once | ORAL | Status: AC
  Administered 2017-04-25: 0.5 mg via ORAL
  Filled 2017-04-25: qty 1

## 2017-04-25 NOTE — ED Notes (Signed)
Abram Sander., RN, witnessed waste of 0.5 mg PO ativan in sharps container in room.

## 2017-04-25 NOTE — ED Provider Notes (Signed)
[facility] EMERGENCY DEPARTMENT Provider Note   CSN: [id number] Arrival date & time: 04/25/17  1107     History   Chief Complaint No chief complaint on file.   HPI Kristin Bradford is a 61 y.o. female.  HPI 61 year old female who presents with episode of palpitations yesterday evening.  She has a previous history of paroxysmal atrial fibrillation, primarily postoperative in nature.  She had followed up with heart care afterwards and it was decided that she not be started on any anticoagulants or significant treatment.  She does have metoprolol for when she has the symptoms.  Yesterday evening at around 8 PM while at rest she developed an episode of vomiting, palpitations, and jaw pain.  She states that these are the exact same symptoms as when she has had atrial fibrillation in the past.  She took a dose of her metoprolol, and around 11:00 PM her symptoms had resolved.  States that she continues to have occasional skipped beats as if she was having PVCs.  She currently denies any chest pain, difficulty breathing, recent illnesses, fever, GI illness, respiratory illness, dysuria or urinary frequency, lower extremity edema, or calf tenderness.   Past Medical History:  Diagnosis Date  . A-fib ([place])   . Acquired deafness of left ear    S/P  RESECTION ACOUSTIC NEUROMA  2004  . Acquired facial asymmetry    post surgery  . Dysrhythmia    a fib after surgery  . GERD (gastroesophageal reflux disease)   . Gestational diabetes mellitus    gestastional  . Headache    "weekly" (05/22/2015)  . 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 Hyperlipidemia   . Hypothyroidism    hx of no meds now  . IC (interstitial cystitis)   . Migraine    "q several months" (05/22/2015)  . PONV (postoperative nausea and vomiting)    atrial fib  . Syncope     Patient Active Problem List   Diagnosis Date Noted  . 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 Chest pain 10/24/2016  . Unilateral primary osteoarthritis, left hip 10/06/2016  . Faintness   . Syncope 05/21/2015  . Nausea and vomiting 05/21/2015  . Diarrhea 05/21/2015  . Gastroenteritis 05/21/2015  . IC (interstitial cystitis)   . Atrial fibrillation, new onset ([place]) 05/18/2015  . Atrial fibrillation ([place]) 05/18/2015  . Hypothyroidism 05/18/2015  . Interstitial cystitis 05/18/2015  . Acoustic neuroma ([place]) 05/18/2015    Past Surgical History:  Procedure Laterality Date  . ACOUSTIC NEUROMA RESECTION Left 2004  . BRAIN SURGERY    . CATARACT EXTRACTION W/ INTRAOCULAR LENS IMPLANT Right 01/2014  . COLONOSCOPY  11/2014  . CYSTO WITH HYDRODISTENSION N/A 05/18/2015   Procedure: CYSTOSCOPY/HYDRODISTENSION;  Surgeon: Carolan Clines, MD;  Location: [facility];  Service: Urology;  Laterality: N/A;  . CYSTO/  HYDRODISTENTION/  INSTILLATION THERAPY  x4  last one 05-16-2010   since 1990's  . ESOPHAGOGASTRODUODENOSCOPY  11/2014  . HEMANGIOMA EXCISION Left 2005    FACIAL ANGIOMA REMOVAL / MASTOIDECTOMY  . HEMORRHOID BANDING  05/2015  . MASTOIDECTOMY Left 2005   LEFT FACIAL ANGIOMA REMOVAL /  LEFT MASTOIDECTOMY [Other]  . TOTAL HIP ARTHROPLASTY Left 10/24/2016   Procedure: LEFT TOTAL HIP ARTHROPLASTY ANTERIOR APPROACH;  Surgeon: Mcarthur Rossetti, MD;  Location: [facility];  Service: Orthopedics;  Laterality: Left;  . [place] EXTRACTION  1980   "all 4"  OB History    Gravida Para Term Preterm AB Living   3 2 2   1 2    SAB TAB Ectopic Multiple Live Births   1               Home Medications    Prior to Admission medications   Medication Sig Start Date End Date Taking? Authorizing Provider  ALPRAZolam Duanne Moron) 0.5 MG tablet Take 0.25 mg by mouth daily as needed for anxiety.  07/23/16   [provider]  Biotin w/ Vitamins C & E (HAIR/SKIN/NAILS PO) Take 1 tablet by mouth daily.    [provider]    diphenhydramine-acetaminophen (TYLENOL PM) 25-500 MG TABS tablet Take 1 tablet by mouth at bedtime as needed (pain/sleep).    [provider]  gabapentin (NEURONTIN) 300 MG capsule Take 1 capsule (300 mg total) at bedtime by mouth. 04/08/17   Mcarthur Rossetti, MD  metoprolol tartrate (LOPRESSOR) 25 MG tablet Take 1 tablet (25 mg total) by mouth 2 (two) times daily as needed (fast or irregular heart rate). 12/26/16 03/26/17  Lyda Jester M, PA-C  naproxen (NAPROSYN) 500 MG tablet TAKE 1 TABLET (500 MG TOTAL) BY MOUTH 2 (TWO) TIMES DAILY WITH A MEAL. 03/16/17   Mcarthur Rossetti, MD  polyethylene glycol powder (GLYCOLAX/MIRALAX) powder Take 0.25 Containers by mouth daily.     [provider]  progesterone (PROMETRIUM) 100 MG capsule Take 100 mg by mouth at bedtime.    [provider]  venlafaxine XR (EFFEXOR-XR) 37.5 MG 24 hr capsule Take 37.5 mg by mouth daily with breakfast.  08/14/16   [provider]    Family History Family History  Adopted: Yes    Social History Social History   Tobacco Use  . Smoking status: Never Smoker  . Smokeless tobacco: Never Used  Substance Use Topics  . Alcohol use: Yes    Alcohol/week: 0.6 oz    Types: 1 Glasses of wine per week    Comment: rare  . Drug use: No     Allergies   Betadine [povidone iodine]; Codeine; Fentanyl; Other; Penicillins; and Tetanus toxoids   Review of Systems Review of Systems  Constitutional: Negative for fever.  Respiratory: Negative for shortness of breath.   Cardiovascular: Negative for chest pain.  Gastrointestinal: Negative for abdominal pain.  Neurological: Negative for syncope.  All other systems reviewed and are negative.    Physical Exam Updated Vital Signs BP 105/65   Pulse 77   Resp 18   SpO2 97%   Physical Exam Physical Exam  Nursing note and vitals reviewed. Constitutional: Well developed, well nourished, non-toxic, and in no acute  distress Head: Normocephalic and atraumatic.  Mouth/Throat: Oropharynx is clear and moist.  Neck: Normal range of motion. Neck supple.  Cardiovascular: Normal rate and regular rhythm.   Pulmonary/Chest: Effort normal and breath sounds normal.  Abdominal: Soft. There is no tenderness. There is no rebound and no guarding.  Musculoskeletal: Normal range of motion.  Neurological: Alert, no facial droop, fluent speech, moves all extremities symmetrically Skin: Skin is warm and dry.  Psychiatric: Cooperative   ED Treatments / Results  Labs (all labs ordered are listed, but only abnormal results are displayed) Labs Reviewed  BASIC METABOLIC PANEL - Abnormal; Notable for the following components:      Result Value   Glucose, Bld 118 (*)    All other components within normal limits  CBC WITH DIFFERENTIAL/PLATELET  MAGNESIUM  I-STAT TROPONIN, ED  EKG  EKG Interpretation  Date/Time:  Saturday April 25 2017 11:10:26 EST Ventricular Rate:  92 PR Interval:  152 QRS Duration: 74 QT Interval:  344 QTC Calculation: 425 R Axis:   82 Text Interpretation:  Normal sinus rhythm T wave abnormality, consider inferior ischemia Abnormal ECG similar to last EKG  Confirmed by Brantley Stage [phone]) on 04/25/2017 12:16:37 PM       Radiology No results found.  Procedures Procedures (including critical care time)  Medications Ordered in ED Medications  sodium chloride 0.9 % bolus 1,000 mL (1,000 mLs Intravenous New Bag/Given 04/25/17 1218)  LORazepam (ATIVAN) tablet 0.5 mg (0.5 mg Oral Given 04/25/17 1216)     Initial Impression / Assessment and Plan / ED Course  I have reviewed the triage vital signs and the nursing notes.  Pertinent labs & imaging results that were available during my care of the patient were reviewed by me and considered in my medical decision making (see chart for details).     Presents with symptoms of atrial fibrillation, now resolved.  States that she gets the  same symptoms whenever she has had paroxysmal A. fib with RVR.  She appears very anxious today, but is nontoxic in no acute distress.  Her vital signs are within normal limits.  She is in sinus rhythm.  Blood work without major electrolyte or metabolic derangements.  She has a chads 2 Vasc score of 1, low risk and would not want to take anticoagulation. I have asked her to take ASA. I have referred her back to [facility] for re-check and medication management if needed as outpatient. Her troponin is negative and I do not feel this is ACS presentation. She is low risk and symptom free for 12 hours now.   I do feel she is stable for discharge home. Strict return and follow-up instructions reviewed. She expressed understanding of all discharge instructions and felt comfortable with the plan of care.   Final Clinical Impressions(s) / ED Diagnoses   Final diagnoses:  Palpitations  Paroxysmal atrial fibrillation [facility])    ED Discharge Orders    None       Forde Dandy, MD 04/25/17 1234

## 2017-04-25 NOTE — ED Triage Notes (Signed)
Patient reports that she has hx of a-fib and thinks she converted back into fib last evening, took extra lopressor with some improvement, alert and oriented.

## 2017-04-25 NOTE — Discharge Instructions (Signed)
Start taking baby aspirin daily for stroke prevention. Your blood work is reassuring. You have been in normal rhythm in the ED. You can take metoprolol as needed for recurrent palpitations or symptoms of afib Please follow up with [facility] for re-check Please return without fail for worsening symptoms, including confusion, chest pain, difficulty breathing, passing out, or any other symptoms concerning to you.

## 2017-04-25 NOTE — ED Notes (Signed)
0.5 mg Ativan was witness-wasted with Chrislyn, RN

## 2017-04-27 MED FILL — VENLAFAXINE HCL ER 37.5 MG: 37.5 | 30 days supply | Qty: 60 | Fill #0

## 2017-04-28 ENCOUNTER — Other Ambulatory Visit (INDEPENDENT_AMBULATORY_CARE_PROVIDER_SITE_OTHER): Payer: Self-pay | Admitting: Physician Assistant

## 2017-04-30 ENCOUNTER — Encounter (HOSPITAL_COMMUNITY): Payer: Self-pay | Admitting: Emergency Medicine

## 2017-04-30 ENCOUNTER — Other Ambulatory Visit: Payer: Self-pay

## 2017-04-30 ENCOUNTER — Encounter (HOSPITAL_COMMUNITY): Payer: Self-pay

## 2017-04-30 ENCOUNTER — Encounter (HOSPITAL_COMMUNITY)
Admission: RE | Admit: 2017-04-30 | Discharge: 2017-04-30 | Disposition: A | Discharge status: Home / Self Care | Payer: PRIVATE HEALTH INSURANCE | Source: Ambulatory Visit | Attending: Orthopaedic Surgery | Admitting: Orthopaedic Surgery

## 2017-04-30 DIAGNOSIS — Z96642 Presence of left artificial hip joint: Secondary | ICD-10-CM | POA: Insufficient documentation

## 2017-04-30 DIAGNOSIS — M25552 Pain in left hip: Secondary | ICD-10-CM | POA: Diagnosis not present

## 2017-04-30 DIAGNOSIS — Z01818 Encounter for other preprocedural examination: Secondary | ICD-10-CM | POA: Insufficient documentation

## 2017-04-30 HISTORY — DX: Myoneural disorder, unspecified: G70.9

## 2017-04-30 LAB — BASIC METABOLIC PANEL
ANION GAP: 6 (ref 5–15)
BUN: 15 mg/dL (ref 6–20)
CALCIUM: 9.4 mg/dL (ref 8.9–10.3)
CHLORIDE: 106 mmol/L (ref 101–111)
CO2: 27 mmol/L (ref 22–32)
Creatinine, Ser: 0.79 mg/dL (ref 0.44–1.00)
GFR calc non Af Amer: >60 mL/min (ref >60)
GLUCOSE: 93 mg/dL (ref 65–99)
POTASSIUM: 3.8 mmol/L (ref 3.5–5.1)
Sodium: 139 mmol/L (ref 135–145)

## 2017-04-30 LAB — CBC
HEMATOCRIT: 37.4 % (ref 36.0–46.0)
HEMOGLOBIN: 12.2 g/dL (ref 12.0–15.0)
MCH: 28.4 pg (ref 26.0–34.0)
MCHC: 32.6 g/dL (ref 30.0–36.0)
MCV: 87.2 fL (ref 78.0–100.0)
Platelets: 278 10*3/uL (ref 150–400)
RBC: 4.29 MIL/uL (ref 3.87–5.11)
RDW: 13.6 % (ref 11.5–15.5)
WBC: 5.7 10*3/uL (ref 4.0–10.5)

## 2017-04-30 LAB — ABO/RH: ABO/RH(D): O POS

## 2017-04-30 LAB — TYPE AND SCREEN
ABO/RH(D): O POS
ANTIBODY SCREEN: NEGATIVE

## 2017-04-30 LAB — SURGICAL PCR SCREEN
MRSA, PCR: NEGATIVE
STAPHYLOCOCCUS AUREUS: NEGATIVE

## 2017-04-30 NOTE — Pre-Procedure Instructions (Signed)
Kristin Bradford  04/30/2017      [place], [place] [address] [address] Phone: [phone] Fax: [phone]    Your procedure is scheduled on Tuesday, December 4.  Report to [facility] Admitting at 1:15 PM                    Your surgery or procedure is scheduled for 3:15 PM   Call this number if you have problems the morning of surgery: [phone]  - Pre- op desk                   For any other questions, please call [phone], Monday - Friday 8 AM - 4 PM.    Remember:  Do not eat food or drink liquids after midnight Monday, December 4  Take these medicines the morning of surgery with A SIP OF WATER:  venlafaxine XR (EFFEXOR-XR)     Take if needed:  metoprolol tartrate (LOPRESSOR)  ALPRAZolam (XANAX)                STOP taking Aspirin, Aspirin Products (Goody Powder, Excedrin Migraine), Ibuprofen (Advil), Naproxen (Aleve), Vitamins and Herbal Products (ie Fish Oil)             Special Instructions:   [place]- Preparing For Surgery  Before surgery, you can play an important role. Because skin is not sterile, your skin needs to be as free of germs as possible. You can reduce the number of germs on your skin by washing with CHG (chlorahexidine gluconate) Soap before surgery.  CHG is an antiseptic cleaner which kills germs and bonds with the skin to continue killing germs even after washing.  Please do not use if you have an allergy to CHG or antibacterial soaps. If your skin becomes reddened/irritated stop using the CHG.  Do not shave (including legs and underarms) for at least 48 hours prior to first CHG shower. It is OK to shave your face.  Please follow these instructions carefully.   1. Shower the NIGHT BEFORE SURGERY and the MORNING OF SURGERY with CHG.   2. If you chose to wash your hair, wash your hair first as usual with your normal shampoo.  3. After you shampoo, rinse  your hair and body thoroughly to remove the shampoo.    Wash your face and private area with the soap you use at home, then rinse.  4. Use CHG as you would any other liquid soap. You can apply CHG directly to the skin and wash gently with a scrungie or a clean washcloth.   5. Apply the CHG Soap to your body ONLY FROM THE NECK DOWN.  Do not use on open wounds or open sores. Avoid contact with your eyes, ears, mouth and genitals (private parts). Wash Face and genitals (private parts)  with your normal soap.  6. Wash thoroughly, paying special attention to the area where your surgery will be performed.  7. Thoroughly rinse your body with warm water from the neck down.  8. DO NOT shower/wash with your normal soap after using and rinsing off the CHG Soap.  9. Pat yourself dry with a CLEAN TOWEL.  10. Wear CLEAN PAJAMAS to bed the night before surgery, wear comfortable clothes the morning of surgery  11. Place CLEAN SHEETS on your bed the night of your first shower and DO NOT SLEEP WITH PETS.  Day of Surgery: Shower as above Do not apply any deodorants/lotions, powders, colognes.. Please wear clean clothes to the hospital/surgery center.    Do not wear jewelry, make-up or nail polish.  Do not shave 48 hours prior to surgery.  Men may shave face and neck.  Do not bring valuables to the hospital.  [facility] is not responsible for any belongings or valuables.  Contacts, dentures or bridgework may not be worn into surgery.  Leave your suitcase in the car.  After surgery it may be brought to your room.  For patients admitted to the hospital, discharge time will be determined by your treatment team.  Patients discharged the day of surgery will not be allowed to drive home.   Please read over the following fact sheets that you were given. Pain Booklet, Patient Instructions for Mupirocin Application, Incentive Spirometry, Surgical Site Infections.

## 2017-04-30 NOTE — Progress Notes (Signed)
Anesthesia Chart Review:  Pt is a 61 year old female scheduled for revision left hip femoral component on 05/05/2017 with Jean Rosenthal, M.D.  - PCP is Mayra Neer, MD  - Used to see cardiologists Cristopher Peru, MD and Skeet Latch, MD.  Cardiologist will be Larae Grooms, MD going forward.  Pt has seen Lyda Jester, PA in his office on 12/26/16.  F/u in 6 months recommended.   PMH includes: Atrial fibrillation, syncope (2017, thought vagally mediated), gestational diabetes, hypothyroidism, post-op N/V, GERD. Never smoker. BMI 25.5. S/p L THA 10/24/16. S/p cystoscopy 05/18/15.   - ED visit 04/25/17 for palpitations that resolved prior to arrival.  Pt believes she was in afib, took her prn metoprolol and palpitations resolved.  Based on history, likely was in afib with RVR that resolved.   - Post-op after L THA 10/24/16, pt was in afib with RVR for about an hour. Spontaneously converted back to NSR.   - Post-op after cystoscopy 05/18/15, pt developed afib, pt spontaneously converted back to NSR.   Medications include: metoprolol  BP 116/76   Pulse 80   Temp 36.8 C   Resp 20   Ht 5\' 11"  (1.803 m)   Wt 183 lb 1.6 oz (83.1 kg)   SpO2 100%   BMI 25.54 kg/m   Preoperative labs reviewed.    EKG 04/25/17:  - NSR. T wave abnormality, consider inferior ischemia - Inferior T wave inversions present on and off since 05/18/15  Echo 10/25/16:  - Left ventricle: The cavity size was normal. Systolic function was vigorous. The estimated ejection fraction was in the range of 65% to 70%. Wall motion was normal; there were no regional wall motion abnormalities. - Aortic valve: There was no regurgitation. - Aortic root: The aortic root was normal in size. - Mitral valve: There was no regurgitation. - Left atrium: The atrium was normal in size. - Right ventricle: The cavity size was normal. Wall thickness was normal. Systolic function was normal. - Right atrium: The atrium was  normal in size. - Tricuspid valve: There was trivial regurgitation. - Pulmonary arteries: Systolic pressure was within the normal range. - Inferior vena cava: The vessel was normal in size. - Pericardium, extracardiac: There was no pericardial effusion.  Cardiac event monitor 11/16/15:  - Quality: Fair.  Baseline artifact. - Several episodes of PVCs and atrial tachycardia. - One episode of syncope was reported, at which time sinus rhythm was noted.   Nuclear stress test 06/13/15:   The left ventricular ejection fraction is normal (55-65%).  Nuclear stress EF: 64%.  There was no ST segment deviation noted during stress.  This is a low risk study.  The study is normal.  Breast attenuation artifact was noted both at rest and with stress.   Willeen Cass, FNP-BC [facility]/Anesthesiology Phone: [phone] 04/30/2017 2:41 PM

## 2017-04-30 NOTE — Progress Notes (Signed)
PCP is Dr. Mayra Neer  Cardiologist is Dr. Beau Fanny (states she has only seen the PA) Denies chest pain, fever, or cough. Denies ever having a card cath. Echo noted 10-25-16  Stress test noted 2017 Reports she went to the ed 04-25-17 with palpitations thought it was an episode  of A fib and has taken Toprol. Instructed pt to call Dr Hebert Soho office to see if she needs to take Toprol before surgery to help prevent A fib. Voices understanding.

## 2017-05-01 ENCOUNTER — Other Ambulatory Visit (HOSPITAL_COMMUNITY): Payer: Self-pay | Admitting: Orthopedic Surgery

## 2017-05-01 ENCOUNTER — Telehealth (INDEPENDENT_AMBULATORY_CARE_PROVIDER_SITE_OTHER): Payer: Self-pay | Admitting: Orthopaedic Surgery

## 2017-05-01 DIAGNOSIS — Z96642 Presence of left artificial hip joint: Secondary | ICD-10-CM

## 2017-05-01 NOTE — Telephone Encounter (Signed)
error 

## 2017-05-04 ENCOUNTER — Ambulatory Visit (HOSPITAL_COMMUNITY)
Admission: RE | Admit: 2017-05-04 | Discharge: 2017-05-04 | Disposition: A | Discharge status: Home / Self Care | Payer: PRIVATE HEALTH INSURANCE | Source: Ambulatory Visit | Attending: Orthopedic Surgery | Admitting: Orthopedic Surgery

## 2017-05-04 ENCOUNTER — Encounter (HOSPITAL_COMMUNITY)
Admission: RE | Admit: 2017-05-04 | Discharge: 2017-05-04 | Disposition: A | Discharge status: Home / Self Care | Payer: PRIVATE HEALTH INSURANCE | Source: Ambulatory Visit | Attending: Orthopedic Surgery | Admitting: Orthopedic Surgery

## 2017-05-04 DIAGNOSIS — Z96642 Presence of left artificial hip joint: Secondary | ICD-10-CM | POA: Insufficient documentation

## 2017-05-04 DIAGNOSIS — R9389 Abnormal findings on diagnostic imaging of other specified body structures: Secondary | ICD-10-CM | POA: Diagnosis not present

## 2017-05-04 MED ORDER — TECHNETIUM TC 99M MEDRONATE IV KIT
21.5000 | PACK | Freq: Once | INTRAVENOUS | Status: AC | PRN
  Administered 2017-05-04: 21.5 via INTRAVENOUS

## 2017-05-05 ENCOUNTER — Encounter (HOSPITAL_COMMUNITY): Admission: RE | Payer: Self-pay | Source: Ambulatory Visit

## 2017-05-05 ENCOUNTER — Inpatient Hospital Stay (HOSPITAL_COMMUNITY)
Admission: RE | Admit: 2017-05-05 | Payer: PRIVATE HEALTH INSURANCE | Source: Ambulatory Visit | Admitting: Orthopaedic Surgery

## 2017-05-05 SURGERY — REVISION, TOTAL ARTHROPLASTY, HIP, ANTERIOR APPROACH
Anesthesia: Choice | Laterality: Left

## 2017-05-08 MED FILL — GABAPENTIN 300 MG CAPSULE: 300 | 30 days supply | Qty: 60 | Fill #0

## 2017-05-12 MED FILL — PROGESTERONE 100 MG CAPSULE: 100 | 30 days supply | Qty: 30 | Fill #11

## 2017-05-19 ENCOUNTER — Encounter (HOSPITAL_COMMUNITY): Payer: Self-pay

## 2017-06-10 MED FILL — PROGESTERONE 100 MG CAPSULE: 100 | 30 days supply | Qty: 30 | Fill #0

## 2017-06-10 MED FILL — VENLAFAXINE HCL ER 37.5 MG: 37.5 | 30 days supply | Qty: 60 | Fill #1

## 2017-06-10 MED FILL — GABAPENTIN 300 MG CAPSULE: 300 | 30 days supply | Qty: 60 | Fill #1

## 2017-06-23 MED FILL — SULFAMETHOXAZOLE-TMP DS TAB: 800-160 | 30 days supply | Qty: 35 | Fill #0

## 2017-07-14 MED FILL — PROGESTERONE 100 MG CAPSULE: 100 | 30 days supply | Qty: 30 | Fill #1

## 2017-08-14 MED FILL — GABAPENTIN 300 MG CAPSULE: 300 | 30 days supply | Qty: 60 | Fill #2

## 2017-08-14 MED FILL — PROGESTERONE MICRONIZED 100: 100 | 30 days supply | Qty: 30 | Fill #0

## 2017-09-02 MED FILL — VENLAFAXINE HCL ER 37.5 MG: 37.5 | 30 days supply | Qty: 60 | Fill #2

## 2017-09-14 MED FILL — PROGESTERONE MICRONIZED 100: 100 | 30 days supply | Qty: 30 | Fill #1

## 2017-10-14 MED FILL — PROGESTERONE 100 MG CAPSULE: 100 | 30 days supply | Qty: 30 | Fill #2

## 2017-10-30 MED FILL — VENLAFAXINE HCL ER 37.5 MG: 37.5 | 30 days supply | Qty: 60 | Fill #3

## 2017-11-19 MED FILL — PROGESTERONE 100 MG CAPSULE: 100 | 30 days supply | Qty: 30 | Fill #3

## 2017-12-22 MED FILL — PROGESTERONE 100 MG CAPSULE: 100 | 30 days supply | Qty: 30 | Fill #4

## 2018-01-01 MED FILL — VENLAFAXINE HCL ER 37.5 MG: 37.5 | 30 days supply | Qty: 60 | Fill #4

## 2018-01-18 MED FILL — ROSUVASTATIN CALCIUM 5 MG T: 5 | 30 days supply | Qty: 30 | Fill #0

## 2018-01-25 ENCOUNTER — Other Ambulatory Visit: Payer: Self-pay | Admitting: Family Medicine

## 2018-01-25 DIAGNOSIS — R42 Dizziness and giddiness: Secondary | ICD-10-CM

## 2018-01-25 MED FILL — PROGESTERONE 100 MG CAPSULE: 100 | 30 days supply | Qty: 30 | Fill #5

## 2018-02-03 ENCOUNTER — Ambulatory Visit
Admission: RE | Admit: 2018-02-03 | Discharge: 2018-02-03 | Disposition: A | Discharge status: Home / Self Care | Payer: PRIVATE HEALTH INSURANCE | Source: Ambulatory Visit | Attending: Family Medicine | Admitting: Family Medicine

## 2018-02-03 DIAGNOSIS — R42 Dizziness and giddiness: Secondary | ICD-10-CM

## 2018-02-03 MED ORDER — GADOBENATE DIMEGLUMINE 529 MG/ML IV SOLN
17.0000 mL | Freq: Once | INTRAVENOUS | Status: AC | PRN
  Administered 2018-02-03: 17 mL via INTRAVENOUS

## 2018-02-23 MED FILL — PROGESTERONE 100 MG CAPSULE: 100 | 30 days supply | Qty: 30 | Fill #6

## 2018-02-23 MED FILL — VENLAFAXINE HCL ER 37.5 MG: 37.5 | 30 days supply | Qty: 60 | Fill #5

## 2018-03-29 MED FILL — PROGESTERONE 100 MG CAPSULE: 100 | 30 days supply | Qty: 30 | Fill #7

## 2018-04-25 MED FILL — ROSUVASTATIN CALCIUM 5 MG T: 5 | 30 days supply | Qty: 30 | Fill #1

## 2018-04-26 MED FILL — PROGESTERONE 100 MG CAPSULE: 100 | 30 days supply | Qty: 30 | Fill #8

## 2018-05-10 MED FILL — VENLAFAXINE HCL ER 37.5 MG: 37.5 | 30 days supply | Qty: 60 | Fill #0

## 2018-05-28 MED FILL — ROSUVASTATIN CALCIUM 5 MG T: 5 | 30 days supply | Qty: 30 | Fill #2

## 2018-05-28 MED FILL — PROGESTERONE 100 MG CAPSULE: 100 | 30 days supply | Qty: 30 | Fill #9

## 2018-06-29 MED FILL — PROGESTERONE 100 MG CAPSULE: 100 | 30 days supply | Qty: 30 | Fill #10

## 2018-07-01 MED FILL — ROSUVASTATIN CALCIUM 5 MG T: 5 | 30 days supply | Qty: 30 | Fill #3

## 2018-07-08 MED FILL — METHYLPREDNISOLONE 4 MG TBP: 4 | 6 days supply | Qty: 21 | Fill #0

## 2018-07-21 MED FILL — SULFAMETHOXAZOLE-TMP DS TAB: 800-160 | 20 days supply | Qty: 20 | Fill #0

## 2018-07-23 MED FILL — FLUCONAZOLE 200 MG TAB: 200 | 4 days supply | Qty: 4 | Fill #0

## 2018-08-09 MED FILL — PROGESTERONE 100 MG CAPSULE: 100 | 30 days supply | Qty: 30 | Fill #0

## 2018-08-23 MED FILL — ROSUVASTATIN CALCIUM 5 MG T: 5 | 30 days supply | Qty: 30 | Fill #4

## 2018-08-24 MED FILL — PROGESTERONE 100 MG CAPSULE: 100 | 30 days supply | Qty: 30 | Fill #0

## 2018-08-25 MED FILL — TRINTELLIX 10 MG TABLET: 10 | 30 days supply | Qty: 30 | Fill #0

## 2018-10-11 MED FILL — ESCITALOPRAM 10 MG TABLET: 10 | 30 days supply | Qty: 30 | Fill #0

## 2018-10-11 MED FILL — PROGESTERONE 100 MG CAPSULE: 100 | 30 days supply | Qty: 30 | Fill #1

## 2018-10-11 MED FILL — ROSUVASTATIN CALCIUM 5 MG T: 5 | 30 days supply | Qty: 30 | Fill #5

## 2018-11-15 MED FILL — PROGESTERONE 100 MG CAPSULE: 100 | 30 days supply | Qty: 30 | Fill #2

## 2018-11-15 MED FILL — ESCITALOPRAM 10 MG TABLET: 10 | 30 days supply | Qty: 30 | Fill #1

## 2018-11-15 MED FILL — ROSUVASTATIN CALCIUM 5 MG T: 5 | 30 days supply | Qty: 30 | Fill #6

## 2018-12-10 ENCOUNTER — Telehealth: Payer: Self-pay | Admitting: Interventional Cardiology

## 2018-12-10 ENCOUNTER — Encounter: Payer: Self-pay | Admitting: Interventional Cardiology

## 2018-12-10 NOTE — Telephone Encounter (Signed)

## 2018-12-12 NOTE — Progress Notes (Signed)
Virtual Visit via Video Note   This visit type was conducted due to national recommendations for restrictions regarding the COVID-19 Pandemic (e.g. social distancing) in an effort to limit this patient's exposure and mitigate transmission in our community.  Due to her co-morbid illnesses, this patient is at least at moderate risk for complications without adequate follow up.  This format is felt to be most appropriate for this patient at this time.  All issues noted in this document were discussed and addressed.  A limited physical exam was performed with this format.  Please refer to the patient's chart for her consent to telehealth for [facility].   Patient unable to access camera.   Date:  12/13/2018   ID:  Kristin Bradford, DOB 04-Feb-1956, MRN [id number]  Patient Location: Home Provider Location: Home  PCP:  Mayra Neer, MD  Cardiologist:  No primary care provider on file. Irish Lack- new Electrophysiologist:  None   Evaluation Performed:  New Patient Evaluation  Chief Complaint:  PAF  History of Present Illness:    Kristin Bradford is a 63 y.o. female with a h/o PAF.  Prior records from 2018 noted: "PAF: only 2 documented occurrences, both immediately following surgery. She can tell when she is out of rhythm and She denies any recurrent breakthrough symptoms since her last surgery. RRR on exam today. HR and BP both stable. CHA2DS2 VASc score is 1, thus we will continue ASA only. She was seen in past by Dr. Lovena Le and declined Flecainide. We discussed PRN metoprolol, in the event that she experiences breakthrough symptoms/ increased HR. She is in agreement with this. Will order 25 mg tablets of metoprolol PRN for afib/ tachycardia. She was instructed to avoid triggers (caffiene and ETOH). F/u in 6 months with Dr. Irish Lack. "  Kristin Bradford was seen by Dr. Lovena Le in the past and noted to have:  "PAF - she has had no recurrence. She had been on Eliquis. Her CHADSVASC score is 1. I have  recommended stopping Eliquis. Low dose ASA would be an option. 2. Palpitations - she is symptomatic with he PAC's, PVC's and PAF. I have offered her flecainide. She is reflecting on this.  3. Hypotension - she appears to have some autonomic dysfunction by her history. I have recommended she increase her salt and fluid intake as tolerated.    She will be having GYN surgery with Dr. Helane Rima.  [facility] needs clearance because of her prior perioperative AFib.    Denies : Chest pain. Dizziness. Leg edema. Nitroglycerin use. Orthopnea. Paroxysmal nocturnal dyspnea. Shortness of breath. Syncope.   Walks a lot at work without cardiac sx.  The patient does not have symptoms concerning for COVID-19 infection (fever, chills, cough, or new shortness of breath).    Past Medical History:  Diagnosis Date  . A-fib ([place])   . Acquired deafness of left ear    S/P  RESECTION ACOUSTIC NEUROMA  2004  . Acquired facial asymmetry    post surgery  . Dysrhythmia    a fib after surgery  . GERD (gastroesophageal reflux disease)   . Gestational diabetes mellitus    gestastional  . Headache    "weekly" (05/22/2015)  . 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 Hyperlipidemia   . Hypothyroidism    hx of no meds now  . IC (interstitial cystitis)   . Migraine    "q several months" (05/22/2015)  . Neuromuscular disorder ([place])  tremor to right side when upset   . PONV (postoperative nausea and vomiting)    atrial fib  . Syncope    Past Surgical History:  Procedure Laterality Date  . ACOUSTIC NEUROMA RESECTION Left 2004  . BRAIN SURGERY    . CATARACT EXTRACTION W/ INTRAOCULAR LENS IMPLANT Bilateral 01/2014  . COLONOSCOPY  11/2014  . CYSTO WITH HYDRODISTENSION N/A 05/18/2015   Procedure: CYSTOSCOPY/HYDRODISTENSION;  Surgeon: Carolan Clines, MD;  Location[facility];  Service: Urology;  Laterality: N/A;  . CYSTO/  HYDRODISTENTION/  INSTILLATION THERAPY   x4  last one 05-16-2010   since 1990's  . ESOPHAGOGASTRODUODENOSCOPY  11/2014  . HEMANGIOMA EXCISION Left 2005    FACIAL ANGIOMA REMOVAL / MASTOIDECTOMY  . HEMORRHOID BANDING  05/2015  . MASTOIDECTOMY Left 2005   LEFT FACIAL ANGIOMA REMOVAL /  LEFT MASTOIDECTOMY [Other]  . TOTAL HIP ARTHROPLASTY Left 10/24/2016   Procedure: LEFT TOTAL HIP ARTHROPLASTY ANTERIOR APPROACH;  Surgeon: Mcarthur Rossetti, MD;  Location: [facility];  Service: Orthopedics;  Laterality: Left;  . [place]   "all 4"     Current Meds  Medication Sig  . ALPRAZolam (XANAX) 0.5 MG tablet Take 0.25 mg by mouth daily as needed for anxiety.   Kristin Kitchen escitalopram (LEXAPRO) 10 MG tablet Take 10 mg by mouth daily.  . polyethylene glycol (MIRALAX / GLYCOLAX) packet Take 17 g by mouth daily.  . progesterone (PROMETRIUM) 100 MG capsule Take 100 mg by mouth at bedtime.  . rosuvastatin (CRESTOR) 5 MG tablet Take 5 mg by mouth daily.     Allergies:   Betadine [povidone iodine], Codeine, Fentanyl, Other, Penicillins, and Tetanus toxoids   Social History   Tobacco Use  . Smoking status: Never Smoker  . Smokeless tobacco: Never Used  Substance Use Topics  . Alcohol use: Yes    Alcohol/week: 1.0 standard drinks    Types: 1 Glasses of wine per week    Comment: rare  . Drug use: No     Family Hx: The patient's family history is not on file. She was adopted.  ROS:   Please see the history of present illness.    No recent AFib; stress in the past can trigger AFib All other systems reviewed and are negative.   Prior CV studies:   The following studies were reviewed today:  2018 echo: normal  Labs/Other Tests and Data Reviewed:    EKG:  An ECG dated 2018 was personally reviewed today and demonstrated:  NSR  Recent Labs: No results found for requested labs within last 8760 hours.   Recent Lipid Panel No results found for: CHOL, TRIG, HDL, CHOLHDL, LDLCALC, LDLDIRECT  Wt Readings from Last 3  Encounters:  12/13/18 198 lb (89.8 kg)  04/30/17 183 lb 1.6 oz (83.1 kg)  03/23/17 200 lb (90.7 kg)     Objective:    Vital Signs:  BP 123/73   Pulse 96   Temp (!) 96.8 F (36 C)   Ht 5\' 11"  (1.803 m)   Wt 198 lb (89.8 kg)   BMI 27.62 kg/m    VITAL SIGNS:  reviewed RESPIRATORY:  no SHOB PSYCH:  normal affect exam limited  ASSESSMENT & PLAN:    1. PAF: No recent AFib sx.  Needs GYN surgery.  Will pretreat with metoprolol 12.5 mg BID for the day before, day of and a few days after surgery if BP allows. No further cardiac testing needed before surgery. 2. Hypotension/autonomic dysfunction.  Diagnosed by Dr. Lovena Le. Stay hydrated, especially when taking metoprolol.  3. PVCs/PACs: Feels them occasionally, but they are not too bothersome.   Lightheadedness with metoprolol 25 mg in the past.    COVID-19 Education: The signs and symptoms of COVID-19 were discussed with the patient and how to seek care for testing (follow up with PCP or arrange E-visit).  The importance of social distancing was discussed today.  Time:   Today, I have spent 25 minutes with the patient with telehealth technology discussing the above problems.     Medication Adjustments/Labs and Tests Ordered: Current medicines are reviewed at length with the patient today.  Concerns regarding medicines are outlined above.   Tests Ordered: No orders of the defined types were placed in this encounter.   Medication Changes: No orders of the defined types were placed in this encounter.   Follow Up:  Virtual Visit or In Person prn  Signed, Larae Grooms, MD  12/13/2018 8:28 AM    [place]

## 2018-12-13 ENCOUNTER — Encounter: Payer: Self-pay | Admitting: Interventional Cardiology

## 2018-12-13 ENCOUNTER — Other Ambulatory Visit: Payer: Self-pay

## 2018-12-13 ENCOUNTER — Telehealth (INDEPENDENT_AMBULATORY_CARE_PROVIDER_SITE_OTHER): Payer: PRIVATE HEALTH INSURANCE | Admitting: Interventional Cardiology

## 2018-12-13 VITALS — BP 123/73 | HR 96 | Temp 96.8°F | Ht 71.0 in | Wt 198.0 lb

## 2018-12-13 DIAGNOSIS — Z01810 Encounter for preprocedural cardiovascular examination: Secondary | ICD-10-CM | POA: Diagnosis not present

## 2018-12-13 DIAGNOSIS — I480 Paroxysmal atrial fibrillation: Secondary | ICD-10-CM | POA: Diagnosis not present

## 2018-12-13 DIAGNOSIS — I493 Ventricular premature depolarization: Secondary | ICD-10-CM | POA: Diagnosis not present

## 2018-12-13 DIAGNOSIS — I491 Atrial premature depolarization: Secondary | ICD-10-CM

## 2018-12-13 MED ORDER — METOPROLOL TARTRATE 25 MG PO TABS: 12.5000 mg | ORAL_TABLET | Freq: Two times a day (BID) | ORAL | 3 refills | Status: DC | PRN

## 2018-12-13 MED FILL — METOPROLOL TARTRATE 25 MG T: 25 | 30 days supply | Qty: 30 | Fill #0

## 2018-12-13 MED FILL — PROGESTERONE 100 MG CAPSULE: 100 | 30 days supply | Qty: 30 | Fill #3

## 2018-12-13 MED FILL — ROSUVASTATIN CALCIUM 5 MG T: 5 | 30 days supply | Qty: 30 | Fill #0

## 2018-12-13 NOTE — Patient Instructions (Signed)
Medication Instructions:  Your physician has recommended you make the following change in your medication:   metoprolol tartrate (lopressor) 25 mg tablet: Take 1/2 tablet (12.5 mg total) twice a day as needed for palpitations  ---Take 1/2 tablet twice a day the day before your surgery, the day of your surgery, and for 2 days after your surgery. Monitor your Blood Pressure.---   Lab work: None Ordered  If you have labs (blood work) drawn today and your tests are completely normal, you will receive your results only by: Marland Kitchen MyChart Message (if you have MyChart) OR . A paper copy in the mail If you have any lab test that is abnormal or we need to change your treatment, we will call you to review the results.  Testing/Procedures: None ordered  Follow-Up: . AS NEEDED  Any Other Special Instructions Will Be Listed Below (If Applicable).

## 2018-12-14 MED FILL — ESCITALOPRAM 10 MG TABLET: 10 | 30 days supply | Qty: 30 | Fill #0

## 2019-01-13 MED FILL — ESCITALOPRAM 10 MG TABLET: 10 | 30 days supply | Qty: 30 | Fill #1

## 2019-01-14 MED FILL — PROGESTERONE 100 MG CAPSULE: 100 | 30 days supply | Qty: 30 | Fill #4

## 2019-01-14 MED FILL — ROSUVASTATIN CALCIUM 5 MG T: 5 | 30 days supply | Qty: 30 | Fill #1

## 2019-02-03 MED FILL — FLUCONAZOLE 150 MG TABLET: 150 | 3 days supply | Qty: 2 | Fill #0

## 2019-02-11 MED FILL — ESCITALOPRAM 10 MG TABLET: 10 | 30 days supply | Qty: 30 | Fill #2

## 2019-03-09 MED FILL — PROGESTERONE 100 MG CAPSULE: 100 | 30 days supply | Qty: 30 | Fill #5

## 2019-03-11 NOTE — Patient Instructions (Addendum)
You need to have Covid test on 03/26/19  [address]. [place] after until your surgery day.     Kristin Bradford       Your procedure is scheduled on  03/30/19    Report to [place]  at   5:30 A.M.   Call this number if you have problems the morning of [phone]    OUR ADDRESS IS [address], WE ARE LOCATED IN THE MEDICAL PLAZA WITH [facility].   Remember:  Do not eat food after midnight.   YOU MAY HAVE CLEAR LIQUIDS FROM MIDNIGHT UNTIL 4:30AM . At 4:30AM Please finish the prescribed Pre-Surgery  drink.  Nothing by mouth after you finish the  drink !   Take these medicines the morning of surgery with A SIP OF WATER  Lexapro, Metoprolol   Do not wear jewelry, make-up or nail polish.  Do not wear lotions, powders, or perfumes, or deoderant.  Do not shave 48 hours prior to surgery.   Do not bring valuables to the hospital.  [facility] is not responsible for any belongings or valuables.  Contacts, dentures or bridgework may not be worn into surgery.  Leave your suitcase in the car.  After surgery it may be brought to your room.  For patients admitted to the hospital, discharge time will be determined by your treatment team.  Patients discharged the day of surgery will not be allowed to drive home.   Special instructions:    Please read over the following fact sheets that you were given:       [facility] - Preparing for Surgery  Before surgery, you can play an important role.   Because skin is not sterile, your skin needs to be as free of germs as possible.   You can reduce the number of germs on your skin by washing with CHG (chlorahexidine gluconate) soap before surgery.   CHG is an antiseptic cleaner which kills germs and bonds with the skin to continue killing germs even after washing. Please DO NOT use if you have an allergy to CHG or antibacterial soaps.   If your skin becomes reddened/irritated stop using  the CHG and inform your nurse when you arrive at Short Stay. Do not shave (including legs and underarms) for at least 48 hours prior to the first CHG shower.   Please follow these instructions carefully:  1.  Shower with CHG Soap the night before surgery and the  morning of Surgery.  2.  If you choose to wash your hair, wash your hair first as usual with your  normal  shampoo.  3.  After you shampoo, rinse your hair and body thoroughly to remove the  shampoo.                                        4.  Use CHG as you would any other liquid soap.  You can apply chg directly  to the skin and wash                       Gently with a scrungie or clean washcloth.  5.  Apply the CHG Soap to your body ONLY FROM THE NECK DOWN.   Do not use on face/ open  Wound or open sores. Avoid contact with eyes, ears mouth and genitals (private parts).                       Wash face,  Genitals (private parts) with your normal soap.             6.  Wash thoroughly, paying special attention to the area where your surgery  will be performed.  7.  Thoroughly rinse your body with warm water from the neck down.  8.  DO NOT shower/wash with your normal soap after using and rinsing off  the CHG Soap.                9.  Pat yourself dry with a clean towel.            10.  Wear clean pajamas.            11.  Place clean sheets on your bed the night of your first shower and do not  sleep with pets. Day of Surgery : Do not apply any lotions/deodorants the morning of surgery.  Please wear clean clothes to the hospital/surgery center.  FAILURE TO FOLLOW THESE INSTRUCTIONS MAY RESULT IN THE CANCELLATION OF YOUR SURGERY PATIENT SIGNATURE_________________________________  NURSE SIGNATURE__________________________________  ________________________________________________________________________

## 2019-03-13 MED FILL — ESCITALOPRAM 10 MG TABLET: 10 | 30 days supply | Qty: 30 | Fill #3

## 2019-03-16 NOTE — H&P (Signed)
63 year old female with 6.7 pedunculated fibroid is here to discuss surgery. She is having LLQ discomfort and is interested in having it removed She also has interstitial cystitis and is having discomfort and has asked Dr. Tresa Moore to do a hydrodistention She has a history of having a problem after anesthesia and requesting spinal.  Past Medical History:  Diagnosis Date  . A-fib ([place])   . Acquired deafness of left ear    S/P  RESECTION ACOUSTIC NEUROMA  2004  . Acquired facial asymmetry    post surgery  . Dysrhythmia    a fib after surgery  . GERD (gastroesophageal reflux disease)   . Gestational diabetes mellitus    gestastional  . Headache    "weekly" (05/22/2015)  . 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 Hyperlipidemia   . Hypothyroidism    hx of no meds now  . IC (interstitial cystitis)   . Migraine    "q several months" (05/22/2015)  . Neuromuscular disorder (HCC)    tremor to right side when upset   . PONV (postoperative nausea and vomiting)    atrial fib  . Syncope    Past Surgical History:  Procedure Laterality Date  . ACOUSTIC NEUROMA RESECTION Left 2004  . BRAIN SURGERY    . CATARACT EXTRACTION W/ INTRAOCULAR LENS IMPLANT Bilateral 01/2014  . COLONOSCOPY  11/2014  . CYSTO WITH HYDRODISTENSION N/A 05/18/2015   Procedure: CYSTOSCOPY/HYDRODISTENSION;  Surgeon: Carolan Clines, MD;  Location: [facility];  Service: Urology;  Laterality: N/A;  . CYSTO/  HYDRODISTENTION/  INSTILLATION THERAPY  x4  last one 05-16-2010   since 1990's  . ESOPHAGOGASTRODUODENOSCOPY  11/2014  . HEMANGIOMA EXCISION Left 2005    FACIAL ANGIOMA REMOVAL / MASTOIDECTOMY  . HEMORRHOID BANDING  05/2015  . MASTOIDECTOMY Left 2005   LEFT FACIAL ANGIOMA REMOVAL /  LEFT MASTOIDECTOMY [Other]  . TOTAL HIP ARTHROPLASTY Left 10/24/2016   Procedure: LEFT TOTAL HIP ARTHROPLASTY ANTERIOR APPROACH;  Surgeon: Mcarthur Rossetti, MD;  Location: [facility];  Service: Orthopedics;  Laterality: Left;  . [place]   "all 4"   Prior to Admission medications   Medication Sig Start Date End Date Taking? Authorizing Provider  ALPRAZolam Duanne Moron) 0.5 MG tablet Take 0.25 mg by mouth daily as needed for anxiety.  07/23/16  Yes [provider]  COLLAGEN PO Take 0.5 [place] by mouth daily.   Yes [provider]  escitalopram (LEXAPRO) 10 MG tablet Take 10 mg by mouth daily.   Yes [provider]  metoprolol tartrate (LOPRESSOR) 25 MG tablet Take 0.5 tablets (12.5 mg total) by mouth 2 (two) times daily as needed (fast or irregular heart rate). 12/13/18  Yes Jettie Booze, MD  polyethylene glycol [facility] / GLYCOLAX) packet Take 17 g by mouth daily.   Yes [provider]  progesterone (PROMETRIUM) 100 MG capsule Take 100 mg by mouth at bedtime.   Yes [provider]  rosuvastatin (CRESTOR) 5 MG tablet Take 5 mg by mouth daily.   Yes [provider]   Allergies Fentanyl, Betadine [povidone iodine], Codeine, Other, Penicillins, and Tetanus toxoids Family History  Adopted: Yes   Social History   Socioeconomic History  . Marital status: Legally Separated    Spouse name: Not on file  . Number of children: Not on file  . Years of education: Not on file  . Highest education level: Not on file  Occupational History  . Not on file  Social Needs  . Financial resource strain: Not on file  . Food insecurity    Worry: Not on file    Inability: Not on file  . Transportation needs    Medical: Not on file    Non-medical: Not on file  Tobacco Use  . Smoking status: Never Smoker  . Smokeless tobacco: Never Used  Substance and Sexual Activity  . Alcohol use: Yes    Alcohol/week: 1.0 standard drinks    Types: 1 Glasses of wine per week    Comment: rare  . Drug use: No  . Sexual activity: Not Currently    Birth control/protection: None  Lifestyle  . Physical activity     Days per week: Not on file    Minutes per session: Not on file  . Stress: Not on file  Relationships  . Social Herbalist on phone: Not on file    Gets together: Not on file    Attends religious service: Not on file    Active member of club or organization: Not on file    Attends meetings of clubs or organizations: Not on file    Relationship status: Not on file  Other Topics Concern  . Not on file  Social History Narrative  . Not on file   General alert and oriented Lung CTAB Car RRR Abdomen is soft and non tender Palpable pedunculated fibroid  IMPRESSION: Pedunculated fibroid  PLAN: Abdominal myomectomy Risks reviewed Consent signed

## 2019-03-21 ENCOUNTER — Encounter (HOSPITAL_COMMUNITY): Payer: Self-pay

## 2019-03-21 ENCOUNTER — Other Ambulatory Visit: Payer: Self-pay

## 2019-03-21 ENCOUNTER — Encounter (HOSPITAL_COMMUNITY)
Admission: RE | Admit: 2019-03-21 | Discharge: 2019-03-21 | Disposition: A | Discharge status: Home / Self Care | Payer: PRIVATE HEALTH INSURANCE | Source: Ambulatory Visit | Attending: Obstetrics and Gynecology | Admitting: Obstetrics and Gynecology

## 2019-03-21 DIAGNOSIS — D259 Leiomyoma of uterus, unspecified: Secondary | ICD-10-CM | POA: Diagnosis not present

## 2019-03-21 DIAGNOSIS — Z86011 Personal history of benign neoplasm of the brain: Secondary | ICD-10-CM | POA: Insufficient documentation

## 2019-03-21 DIAGNOSIS — Z01812 Encounter for preprocedural laboratory examination: Secondary | ICD-10-CM | POA: Diagnosis not present

## 2019-03-21 DIAGNOSIS — I4891 Unspecified atrial fibrillation: Secondary | ICD-10-CM | POA: Diagnosis not present

## 2019-03-21 DIAGNOSIS — Z79899 Other long term (current) drug therapy: Secondary | ICD-10-CM | POA: Diagnosis not present

## 2019-03-21 DIAGNOSIS — E785 Hyperlipidemia, unspecified: Secondary | ICD-10-CM | POA: Insufficient documentation

## 2019-03-21 DIAGNOSIS — Z96642 Presence of left artificial hip joint: Secondary | ICD-10-CM | POA: Diagnosis not present

## 2019-03-21 DIAGNOSIS — Z8632 Personal history of gestational diabetes: Secondary | ICD-10-CM | POA: Diagnosis not present

## 2019-03-21 LAB — CBC
HCT: 39.4 % (ref 36.0–46.0)
Hemoglobin: 12.7 g/dL (ref 12.0–15.0)
MCH: 29.3 pg (ref 26.0–34.0)
MCHC: 32.2 g/dL (ref 30.0–36.0)
MCV: 91 fL (ref 80.0–100.0)
Platelets: 242 10*3/uL (ref 150–400)
RBC: 4.33 MIL/uL (ref 3.87–5.11)
RDW: 13 % (ref 11.5–15.5)
WBC: 5.3 10*3/uL (ref 4.0–10.5)
nRBC: 0 % (ref 0.0–0.2)

## 2019-03-21 NOTE — Progress Notes (Signed)
PCP - Dr. Raliegh Ip. [place] Cardiologist - Dr. Irish Lack  Chest x-ray - no EKG - no Stress Test - no ECHO - no Cardiac Cath - no  Sleep Study - no CPAP -   Fasting Blood Sugar - NA Checks Blood Sugar _____ times a day  Blood Thinner Instructions:NA Aspirin Instructions: Last Dose:  Anesthesia review:   Patient denies shortness of breath, fever, cough and chest pain at PAT appointment yes  Patient verbalized understanding of instructions that were given to them at the PAT appointment. Patient was also instructed that they will need to review over the PAT instructions again at home before surgery. Yes Pt has post op triggered Afib. She has been instructed to take a dose of Metoprolol the night before surgery and the Day of surgery. She will discuss with her cardiologist.

## 2019-03-22 ENCOUNTER — Other Ambulatory Visit: Payer: Self-pay | Admitting: Urology

## 2019-03-22 NOTE — Anesthesia Preprocedure Evaluation (Addendum)
Anesthesia Evaluation  Patient identified by MRN, date of birth, ID band Patient awake    Reviewed: Allergy & Precautions, NPO status , Patient's Chart, lab work & pertinent test results, reviewed documented beta blocker date and time   History of Anesthesia Complications (+) PONV and history of anesthetic complications  Airway Mallampati: I  TM Distance: >3 FB Neck ROM: Full    Dental no notable dental hx. (+) Teeth Intact, Dental Advisory Given   Pulmonary neg pulmonary ROS,    Pulmonary exam normal breath sounds clear to auscultation       Cardiovascular Normal cardiovascular exam+ dysrhythmias Atrial Fibrillation  Rhythm:Regular Rate:Normal  HLD  Stress Test 2017 The left ventricular ejection fraction is normal (55-65%). Nuclear stress EF: 64%. There was no ST segment deviation noted during stress. This is a low risk study. The study is normal. Breast attenuation artifact was noted both at rest and with stress.  TTE 2018 - Left ventricle: The cavity size was normal. Systolic function was   vigorous. The estimated ejection fraction was in the range of 65%   to 70%. Wall motion was normal; there were no regional wall   motion abnormalities. - Aortic valve: There was no regurgitation. - Aortic root: The aortic root was normal in size. - Mitral valve: There was no regurgitation. - Left atrium: The atrium was normal in size. - Right ventricle: The cavity size was normal. Wall thickness was   normal. Systolic function was normal. - Right atrium: The atrium was normal in size. - Tricuspid valve: There was trivial regurgitation. - Pulmonary arteries: Systolic pressure was within the normal   range. - Inferior vena cava: The vessel was normal in size. - Pericardium, extracardiac: There was no pericardial effusion   Neuro/Psych  Headaches, PSYCHIATRIC DISORDERS Anxiety Depression    GI/Hepatic Neg liver ROS, GERD  ,   Endo/Other  diabetesHypothyroidism   Renal/GU negative Renal ROS  negative genitourinary   Musculoskeletal  (+) Arthritis ,   Abdominal   Peds  Hematology negative hematology ROS (+)   Anesthesia Other Findings   Reproductive/Obstetrics                           Anesthesia Physical Anesthesia Plan  ASA: III  Anesthesia Plan: General   Post-op Pain Management:    Induction: Intravenous  PONV Risk Score and Plan: 4 or greater and Midazolam, Dexamethasone, Ondansetron, Treatment may vary due to age or medical condition and TIVA  Airway Management Planned: Oral ETT  Additional Equipment:   Intra-op Plan:   Post-operative Plan: Extubation in OR  Informed Consent: I have reviewed the patients History and Physical, chart, labs and discussed the procedure including the risks, benefits and alternatives for the proposed anesthesia with the patient or authorized representative who has indicated his/her understanding and acceptance.     Dental advisory given  Plan Discussed with: CRNA  Anesthesia Plan Comments: (See PAT note 03/21/2019, Konrad Felix, PA-C)     Anesthesia Quick Evaluation

## 2019-03-22 NOTE — Progress Notes (Signed)
Anesthesia Chart Review   Case: [id number] Date/Time: 03/30/19 0715   Procedures:      ABDOMINAL MYOMECTOMY (N/A )     CYSTOSCOPY/HYDRODISTENSION (N/A )   Anesthesia type: General   Pre-op diagnosis: Fibroids   Location: [place] OR ROOM 4. / [place]   Surgeon: Dian Queen, MD; Alexis Frock, MD      DISCUSSION:63 y.o. never smoker with h/o PONV, A-fib postoperatively, migraines, hypothyroidism, HLD, fibroids scheduled for above procedure 03/30/2019 with Dr. Dian Queen and Dr. Alexis Frock.   Pt last seen by by cardiologist, Dr. Larae Grooms, 12/13/2018.  Per OV note, "[id number] documented occurrences, both immediately following surgery. She can tell when she is out of rhythm and She denies any recurrent breakthrough symptoms since her last surgery. RRR on exam today. HR and BP both stable. CHA2DS2 VASc scoreis 1, thus we will continue ASA only. She was seen in past by Dr. Lovena Le and declined Flecainide. We discussed PRN metoprolol, in the event that she experiences breakthrough symptoms/ increased HR.  No recent AFib sx.  Needs GYN surgery.  Will pretreat with metoprolol 12.5 mg BID for the day before, day of and a few days after surgery if BP allows. No further cardiac testing needed before surgery."  Dr. Helane Rima is admitting patient postoperatively so she can be monitored after surgery due to previous episodes of postoperative A-fib.    VS: BP 104/66   Pulse 69   Temp 37.3 C (Oral)   Resp 16   Ht 5\' 11"  (1.803 m)   Wt 92.5 kg   SpO2 98%   BMI 28.45 kg/m   PROVIDERS: Mayra Neer, MD is PCP    LABS: Labs reviewed: Acceptable for surgery. (all labs ordered are listed, but only abnormal results are displayed)  Labs Reviewed  CBC  TYPE AND SCREEN     IMAGES:   EKG:   CV: Echo 10/25/2016 Study Conclusions  - Left ventricle: The cavity size was normal. Systolic function was   vigorous. The estimated ejection fraction was in the  range of 65%   to 70%. Wall motion was normal; there were no regional wall   motion abnormalities. - Aortic valve: There was no regurgitation. - Aortic root: The aortic root was normal in size. - Mitral valve: There was no regurgitation. - Left atrium: The atrium was normal in size. - Right ventricle: The cavity size was normal. Wall thickness was   normal. Systolic function was normal. - Right atrium: The atrium was normal in size. - Tricuspid valve: There was trivial regurgitation. - Pulmonary arteries: Systolic pressure was within the normal   range. - Inferior vena cava: The vessel was normal in size. - Pericardium, extracardiac: There was no pericardial effusion.  Impressions:  - Normal study.  Myocardial Perfusion Imaging 06/13/2015  The left ventricular ejection fraction is normal (55-65%).  Nuclear stress EF: 64%.  There was no ST segment deviation noted during stress.  This is a low risk study.  The study is normal.  Breast attenuation artifact was noted both at rest and with stress. Past Medical History:  Diagnosis Date  . A-fib ([place])   . Acquired deafness of left ear    S/P  RESECTION ACOUSTIC NEUROMA  2004  . Acquired facial asymmetry    post surgery  . Dysrhythmia    a fib after surgery  . GERD (gastroesophageal reflux disease)   . Gestational diabetes mellitus    gestastional  . Headache    "  weekly" (05/22/2015)  . 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 Hyperlipidemia   . Hypothyroidism    hx of no meds now  . IC (interstitial cystitis)   . Migraine    "q several months" (05/22/2015)  . Neuromuscular disorder (HCC)    tremor to right side when upset   . PONV (postoperative nausea and vomiting)    atrial fib  . Syncope     Past Surgical History:  Procedure Laterality Date  . ACOUSTIC NEUROMA RESECTION Left 2004  . BRAIN SURGERY    . CATARACT EXTRACTION W/ INTRAOCULAR LENS IMPLANT Bilateral 01/2014   . COLONOSCOPY  11/2014  . CYSTO WITH HYDRODISTENSION N/A 05/18/2015   Procedure: CYSTOSCOPY/HYDRODISTENSION;  Surgeon: Carolan Clines, MD;  Location: [facility];  Service: Urology;  Laterality: N/A;  . CYSTO/  HYDRODISTENTION/  INSTILLATION THERAPY  x4  last one 05-16-2010   since 1990's  . ESOPHAGOGASTRODUODENOSCOPY  11/2014  . HEMANGIOMA EXCISION Left 2005    FACIAL ANGIOMA REMOVAL / MASTOIDECTOMY  . HEMORRHOID BANDING  05/2015  . MASTOIDECTOMY Left 2005   LEFT FACIAL ANGIOMA REMOVAL /  LEFT MASTOIDECTOMY [Other]  . TOTAL HIP ARTHROPLASTY Left 10/24/2016   Procedure: LEFT TOTAL HIP ARTHROPLASTY ANTERIOR APPROACH;  Surgeon: Mcarthur Rossetti, MD;  Location: [facility];  Service: Orthopedics;  Laterality: Left;  . [place]   "all 4"    MEDICATIONS: . ALPRAZolam (XANAX) 0.5 MG tablet  . COLLAGEN PO  . escitalopram (LEXAPRO) 10 MG tablet  . metoprolol tartrate (LOPRESSOR) 25 MG tablet  . polyethylene glycol (MIRALAX / GLYCOLAX) packet  . progesterone (PROMETRIUM) 100 MG capsule  . rosuvastatin (CRESTOR) 5 MG tablet   No current facility-administered medications for this encounter.    . bupivacaine (MARCAINE) 0.5 % 10 mL, triamcinolone acetonide (KENALOG-40) 40 mg injection  . bupivacaine (MARCAINE) 0.5 % 15 mL, phenazopyridine (PYRIDIUM) 400 mg bladder mixture     Maia Plan [facility] Pre-Surgical Testing [id number] 03/22/19  12:47 PM

## 2019-03-23 NOTE — Progress Notes (Signed)
Kristin Bradford made aware of the location change for her surgery, she verbalized understanding.

## 2019-03-26 ENCOUNTER — Other Ambulatory Visit (HOSPITAL_COMMUNITY)
Admission: RE | Admit: 2019-03-26 | Discharge: 2019-03-26 | Disposition: A | Discharge status: Home / Self Care | Payer: PRIVATE HEALTH INSURANCE | Source: Ambulatory Visit | Attending: Obstetrics and Gynecology | Admitting: Obstetrics and Gynecology

## 2019-03-26 DIAGNOSIS — Z01812 Encounter for preprocedural laboratory examination: Secondary | ICD-10-CM | POA: Insufficient documentation

## 2019-03-26 DIAGNOSIS — Z20828 Contact with and (suspected) exposure to other viral communicable diseases: Secondary | ICD-10-CM | POA: Diagnosis not present

## 2019-03-27 LAB — NOVEL CORONAVIRUS, NAA (HOSP ORDER, SEND-OUT TO REF LAB; TAT 18-24 HRS): SARS-CoV-2, NAA: NOT DETECTED

## 2019-03-29 MED ORDER — GENTAMICIN SULFATE 40 MG/ML IJ SOLN
5.0000 mg/kg | INTRAVENOUS | Status: AC
  Administered 2019-03-30: 400 mg via INTRAVENOUS
  Filled 2019-03-29: qty 10

## 2019-03-29 MED ORDER — CLINDAMYCIN PHOSPHATE 900 MG/50ML IV SOLN
900.0000 mg | INTRAVENOUS | Status: AC
  Administered 2019-03-30: 08:00:00 900 mg via INTRAVENOUS
  Filled 2019-03-29: qty 50

## 2019-03-30 ENCOUNTER — Ambulatory Visit (HOSPITAL_COMMUNITY): Payer: PRIVATE HEALTH INSURANCE | Admitting: Physician Assistant

## 2019-03-30 ENCOUNTER — Ambulatory Visit (HOSPITAL_COMMUNITY): Payer: PRIVATE HEALTH INSURANCE | Admitting: Certified Registered"

## 2019-03-30 ENCOUNTER — Ambulatory Visit (HOSPITAL_COMMUNITY)
Admission: RE | Admit: 2019-03-30 | Discharge: 2019-03-31 | Disposition: A | Discharge status: Home / Self Care | Payer: PRIVATE HEALTH INSURANCE | Source: Other Acute Inpatient Hospital | Attending: Obstetrics and Gynecology | Admitting: Obstetrics and Gynecology

## 2019-03-30 ENCOUNTER — Other Ambulatory Visit: Payer: Self-pay

## 2019-03-30 ENCOUNTER — Encounter (HOSPITAL_COMMUNITY): Payer: Self-pay

## 2019-03-30 ENCOUNTER — Encounter (HOSPITAL_COMMUNITY)
Admission: RE | Disposition: A | Discharge status: Home / Self Care | Payer: Self-pay | Source: Other Acute Inpatient Hospital | Attending: Obstetrics and Gynecology

## 2019-03-30 DIAGNOSIS — F419 Anxiety disorder, unspecified: Secondary | ICD-10-CM | POA: Insufficient documentation

## 2019-03-30 DIAGNOSIS — Z888 Allergy status to other drugs, medicaments and biological substances status: Secondary | ICD-10-CM | POA: Insufficient documentation

## 2019-03-30 DIAGNOSIS — F329 Major depressive disorder, single episode, unspecified: Secondary | ICD-10-CM | POA: Insufficient documentation

## 2019-03-30 DIAGNOSIS — Z887 Allergy status to serum and vaccine status: Secondary | ICD-10-CM | POA: Insufficient documentation

## 2019-03-30 DIAGNOSIS — N3011 Interstitial cystitis (chronic) with hematuria: Principal | ICD-10-CM | POA: Insufficient documentation

## 2019-03-30 DIAGNOSIS — E039 Hypothyroidism, unspecified: Secondary | ICD-10-CM | POA: Insufficient documentation

## 2019-03-30 DIAGNOSIS — M1990 Unspecified osteoarthritis, unspecified site: Secondary | ICD-10-CM | POA: Diagnosis not present

## 2019-03-30 DIAGNOSIS — Z79899 Other long term (current) drug therapy: Secondary | ICD-10-CM | POA: Diagnosis not present

## 2019-03-30 DIAGNOSIS — Z9889 Other specified postprocedural states: Secondary | ICD-10-CM

## 2019-03-30 DIAGNOSIS — Z96642 Presence of left artificial hip joint: Secondary | ICD-10-CM | POA: Diagnosis not present

## 2019-03-30 DIAGNOSIS — Z7982 Long term (current) use of aspirin: Secondary | ICD-10-CM | POA: Insufficient documentation

## 2019-03-30 DIAGNOSIS — Z885 Allergy status to narcotic agent status: Secondary | ICD-10-CM | POA: Diagnosis not present

## 2019-03-30 DIAGNOSIS — G43909 Migraine, unspecified, not intractable, without status migrainosus: Secondary | ICD-10-CM | POA: Diagnosis not present

## 2019-03-30 DIAGNOSIS — E785 Hyperlipidemia, unspecified: Secondary | ICD-10-CM | POA: Diagnosis not present

## 2019-03-30 DIAGNOSIS — D259 Leiomyoma of uterus, unspecified: Secondary | ICD-10-CM | POA: Diagnosis not present

## 2019-03-30 DIAGNOSIS — D219 Benign neoplasm of connective and other soft tissue, unspecified: Secondary | ICD-10-CM | POA: Diagnosis present

## 2019-03-30 DIAGNOSIS — Z880 Allergy status to penicillin: Secondary | ICD-10-CM | POA: Insufficient documentation

## 2019-03-30 DIAGNOSIS — K219 Gastro-esophageal reflux disease without esophagitis: Secondary | ICD-10-CM | POA: Insufficient documentation

## 2019-03-30 DIAGNOSIS — N058 Unspecified nephritic syndrome with other morphologic changes: Secondary | ICD-10-CM | POA: Insufficient documentation

## 2019-03-30 DIAGNOSIS — I480 Paroxysmal atrial fibrillation: Secondary | ICD-10-CM | POA: Diagnosis not present

## 2019-03-30 HISTORY — PX: CYSTO WITH HYDRODISTENSION: SHX5453

## 2019-03-30 HISTORY — PX: MYOMECTOMY: SHX85

## 2019-03-30 HISTORY — DX: Benign neoplasm of connective and other soft tissue, unspecified: D21.9

## 2019-03-30 HISTORY — DX: Other specified postprocedural states: Z98.890

## 2019-03-30 LAB — TYPE AND SCREEN
ABO/RH(D): O POS
Antibody Screen: NEGATIVE

## 2019-03-30 SURGERY — MYOMECTOMY, ABDOMINAL APPROACH
Anesthesia: General | Site: Urethra

## 2019-03-30 MED ORDER — DOCUSATE SODIUM 100 MG PO CAPS
100.0000 mg | ORAL_CAPSULE | Freq: Two times a day (BID) | ORAL | Status: DC
  Administered 2019-03-30: 22:00:00 100 mg via ORAL
  Filled 2019-03-30 (×2): qty 1

## 2019-03-30 MED ORDER — ACETAMINOPHEN 500 MG PO TABS
ORAL_TABLET | ORAL | Status: AC
  Administered 2019-03-30: 1000 mg via ORAL
  Filled 2019-03-30: qty 2

## 2019-03-30 MED ORDER — HYDROMORPHONE HCL 1 MG/ML IJ SOLN: 1.0000 mg | Freq: Once | INTRAMUSCULAR | Status: DC | PRN

## 2019-03-30 MED ORDER — DEXAMETHASONE SODIUM PHOSPHATE 10 MG/ML IJ SOLN
INTRAMUSCULAR | Status: DC | PRN
  Administered 2019-03-30: 10 mg via INTRAVENOUS

## 2019-03-30 MED ORDER — ROCURONIUM BROMIDE 10 MG/ML (PF) SYRINGE
PREFILLED_SYRINGE | INTRAVENOUS | Status: AC
  Filled 2019-03-30: qty 10

## 2019-03-30 MED ORDER — KETOROLAC TROMETHAMINE 30 MG/ML IJ SOLN
INTRAMUSCULAR | Status: AC
  Administered 2019-03-30: 09:00:00 30 mg via INTRAVENOUS
  Filled 2019-03-30: qty 1

## 2019-03-30 MED ORDER — DEXAMETHASONE SODIUM PHOSPHATE 10 MG/ML IJ SOLN
INTRAMUSCULAR | Status: AC
  Filled 2019-03-30: qty 1

## 2019-03-30 MED ORDER — LIDOCAINE HCL (PF) 2 % IJ SOLN
INTRAMUSCULAR | Status: AC
  Filled 2019-03-30: qty 20

## 2019-03-30 MED ORDER — PHENAZOPYRIDINE HCL 100 MG PO TABS
100.0000 mg | ORAL_TABLET | Freq: Three times a day (TID) | ORAL | Status: DC
  Administered 2019-03-30 – 2019-03-31 (×2): 100 mg via ORAL
  Filled 2019-03-30 (×3): qty 1

## 2019-03-30 MED ORDER — LIDOCAINE 2% (20 MG/ML) 5 ML SYRINGE
INTRAMUSCULAR | Status: DC | PRN
  Administered 2019-03-30: 1.5 mg/kg/h via INTRAVENOUS

## 2019-03-30 MED ORDER — HYDROMORPHONE HCL 1 MG/ML IJ SOLN
INTRAMUSCULAR | Status: AC
  Filled 2019-03-30: qty 1

## 2019-03-30 MED ORDER — MENTHOL 3 MG MT LOZG: 1.0000 | LOZENGE | OROMUCOSAL | Status: DC | PRN

## 2019-03-30 MED ORDER — EPHEDRINE SULFATE-NACL 50-0.9 MG/10ML-% IV SOSY
PREFILLED_SYRINGE | INTRAVENOUS | Status: DC | PRN
  Administered 2019-03-30: 5 mg via INTRAVENOUS

## 2019-03-30 MED ORDER — PROMETHAZINE HCL 25 MG/ML IJ SOLN
12.5000 mg | Freq: Four times a day (QID) | INTRAMUSCULAR | Status: DC | PRN
  Administered 2019-03-30: 13:00:00 12.5 mg via INTRAVENOUS
  Filled 2019-03-30: qty 1

## 2019-03-30 MED ORDER — HYDROMORPHONE HCL 1 MG/ML IJ SOLN
0.2500 mg | INTRAMUSCULAR | Status: DC | PRN
  Administered 2019-03-30: 10:00:00 0.5 mg via INTRAVENOUS
  Administered 2019-03-30: 10:00:00 0.25 mg via INTRAVENOUS
  Administered 2019-03-30: 10:00:00 0.5 mg via INTRAVENOUS
  Administered 2019-03-30: 10:00:00 0.25 mg via INTRAVENOUS

## 2019-03-30 MED ORDER — HYDROMORPHONE HCL 2 MG/ML IJ SOLN
INTRAMUSCULAR | Status: AC
  Filled 2019-03-30: qty 1

## 2019-03-30 MED ORDER — BUPIVACAINE HCL (PF) 0.25 % IJ SOLN
INTRAMUSCULAR | Status: DC | PRN
  Administered 2019-03-30: 10 mL

## 2019-03-30 MED ORDER — BUPIVACAINE HCL 0.25 % IJ SOLN
INTRAMUSCULAR | Status: AC
  Filled 2019-03-30: qty 1

## 2019-03-30 MED ORDER — KETOROLAC TROMETHAMINE 30 MG/ML IJ SOLN
30.0000 mg | Freq: Once | INTRAMUSCULAR | Status: AC
  Administered 2019-03-30: 09:00:00 30 mg via INTRAVENOUS

## 2019-03-30 MED ORDER — KETAMINE HCL 10 MG/ML IJ SOLN
INTRAMUSCULAR | Status: AC
  Filled 2019-03-30: qty 1

## 2019-03-30 MED ORDER — PROMETHAZINE HCL 25 MG/ML IJ SOLN: 25.0000 mg | Freq: Four times a day (QID) | INTRAMUSCULAR | Status: DC | PRN

## 2019-03-30 MED ORDER — ACETAMINOPHEN 325 MG PO TABS
650.0000 mg | ORAL_TABLET | ORAL | Status: DC | PRN
  Administered 2019-03-31 (×2): 650 mg via ORAL
  Filled 2019-03-30 (×2): qty 2

## 2019-03-30 MED ORDER — DEXAMETHASONE SODIUM PHOSPHATE 10 MG/ML IJ SOLN: INTRAMUSCULAR | Status: DC | PRN

## 2019-03-30 MED ORDER — PROGESTERONE MICRONIZED 100 MG PO CAPS
100.0000 mg | ORAL_CAPSULE | Freq: Every day | ORAL | Status: DC
  Administered 2019-03-30: 100 mg via ORAL
  Filled 2019-03-30: qty 1

## 2019-03-30 MED ORDER — METOPROLOL TARTRATE 25 MG PO TABS: 12.5000 mg | ORAL_TABLET | Freq: Two times a day (BID) | ORAL | Status: DC | PRN

## 2019-03-30 MED ORDER — ONDANSETRON HCL 4 MG/2ML IJ SOLN
4.0000 mg | Freq: Four times a day (QID) | INTRAMUSCULAR | Status: DC | PRN
  Administered 2019-03-30: 4 mg via INTRAVENOUS
  Filled 2019-03-30: qty 2

## 2019-03-30 MED ORDER — ROSUVASTATIN CALCIUM 5 MG PO TABS
5.0000 mg | ORAL_TABLET | Freq: Every day | ORAL | Status: DC
  Filled 2019-03-30: qty 1

## 2019-03-30 MED ORDER — PROPOFOL 500 MG/50ML IV EMUL
INTRAVENOUS | Status: AC
  Filled 2019-03-30: qty 100

## 2019-03-30 MED ORDER — ROPIVACAINE HCL 7.5 MG/ML IJ SOLN: INTRAMUSCULAR | Status: DC | PRN

## 2019-03-30 MED ORDER — PROPOFOL 10 MG/ML IV BOLUS
INTRAVENOUS | Status: DC | PRN
  Administered 2019-03-30: 150 mg via INTRAVENOUS

## 2019-03-30 MED ORDER — LIDOCAINE 2% (20 MG/ML) 5 ML SYRINGE
INTRAMUSCULAR | Status: AC
  Filled 2019-03-30: qty 5

## 2019-03-30 MED ORDER — 0.9 % SODIUM CHLORIDE (POUR BTL) OPTIME
TOPICAL | Status: DC | PRN
  Administered 2019-03-30: 2000 mL

## 2019-03-30 MED ORDER — LIDOCAINE 2% (20 MG/ML) 5 ML SYRINGE
INTRAMUSCULAR | Status: DC | PRN
  Administered 2019-03-30: 60 mg via INTRAVENOUS

## 2019-03-30 MED ORDER — HYDROMORPHONE HCL 1 MG/ML IJ SOLN
INTRAMUSCULAR | Status: DC | PRN
  Administered 2019-03-30 (×2): .25 mg via INTRAVENOUS

## 2019-03-30 MED ORDER — PROPOFOL 10 MG/ML IV BOLUS
INTRAVENOUS | Status: AC
  Filled 2019-03-30: qty 20

## 2019-03-30 MED ORDER — SODIUM CHLORIDE 0.9 % IR SOLN
Status: DC | PRN
  Administered 2019-03-30: 3000 mL via INTRAVESICAL

## 2019-03-30 MED ORDER — LACTATED RINGERS IV SOLN
INTRAVENOUS | Status: DC
  Administered 2019-03-30: 11:00:00 via INTRAVENOUS

## 2019-03-30 MED ORDER — ESCITALOPRAM OXALATE 10 MG PO TABS
10.0000 mg | ORAL_TABLET | Freq: Every day | ORAL | Status: DC
  Administered 2019-03-30: 10 mg via ORAL
  Filled 2019-03-30 (×2): qty 1

## 2019-03-30 MED ORDER — PROPOFOL 500 MG/50ML IV EMUL
INTRAVENOUS | Status: DC | PRN
  Administered 2019-03-30: 150 ug/kg/min via INTRAVENOUS

## 2019-03-30 MED ORDER — ONDANSETRON HCL 4 MG PO TABS: 4.0000 mg | ORAL_TABLET | Freq: Four times a day (QID) | ORAL | Status: DC | PRN

## 2019-03-30 MED ORDER — PROPOFOL 500 MG/50ML IV EMUL
INTRAVENOUS | Status: AC
  Filled 2019-03-30: qty 50

## 2019-03-30 MED ORDER — ONDANSETRON HCL 4 MG/2ML IJ SOLN
INTRAMUSCULAR | Status: DC | PRN
  Administered 2019-03-30: 4 mg via INTRAVENOUS

## 2019-03-30 MED ORDER — HYDROMORPHONE HCL 1 MG/ML IJ SOLN: 1.0000 mg | INTRAMUSCULAR | Status: DC | PRN

## 2019-03-30 MED ORDER — SUGAMMADEX SODIUM 200 MG/2ML IV SOLN
INTRAVENOUS | Status: DC | PRN
  Administered 2019-03-30: 200 mg via INTRAVENOUS

## 2019-03-30 MED ORDER — MIDAZOLAM HCL 2 MG/2ML IJ SOLN
INTRAMUSCULAR | Status: DC | PRN
  Administered 2019-03-30: 2 mg via INTRAVENOUS

## 2019-03-30 MED ORDER — SIMETHICONE 80 MG PO CHEW: 80.0000 mg | CHEWABLE_TABLET | Freq: Four times a day (QID) | ORAL | Status: DC | PRN

## 2019-03-30 MED ORDER — MIDAZOLAM HCL 2 MG/2ML IJ SOLN
INTRAMUSCULAR | Status: AC
  Filled 2019-03-30: qty 2

## 2019-03-30 MED ORDER — ONDANSETRON HCL 4 MG/2ML IJ SOLN
INTRAMUSCULAR | Status: AC
  Filled 2019-03-30: qty 2

## 2019-03-30 MED ORDER — METHOCARBAMOL 500 MG PO TABS
750.0000 mg | ORAL_TABLET | Freq: Four times a day (QID) | ORAL | Status: DC | PRN
  Administered 2019-03-30: 17:00:00 750 mg via ORAL
  Filled 2019-03-30: qty 2

## 2019-03-30 MED ORDER — LACTATED RINGERS IV SOLN
INTRAVENOUS | Status: DC
  Administered 2019-03-30: 07:00:00 via INTRAVENOUS

## 2019-03-30 MED ORDER — ACETAMINOPHEN 500 MG PO TABS
1000.0000 mg | ORAL_TABLET | Freq: Once | ORAL | Status: AC
  Administered 2019-03-30: 06:00:00 1000 mg via ORAL

## 2019-03-30 MED ORDER — ROCURONIUM BROMIDE 10 MG/ML (PF) SYRINGE
PREFILLED_SYRINGE | INTRAVENOUS | Status: DC | PRN
  Administered 2019-03-30: 80 mg via INTRAVENOUS

## 2019-03-30 SURGICAL SUPPLY — 40 items
BAG URINE DRAINAGE (UROLOGICAL SUPPLIES) ×2 IMPLANT
BAG URO CATCHER STRL LF (MISCELLANEOUS) ×4 IMPLANT
CLOSURE WOUND 1/2 X4 (GAUZE/BANDAGES/DRESSINGS) ×1
CLOTH BEACON ORANGE TIMEOUT ST (SAFETY) ×4 IMPLANT
COVER SURGICAL LIGHT HANDLE (MISCELLANEOUS) ×4 IMPLANT
COVER WAND RF STERILE (DRAPES) IMPLANT
DRAPE WARM FLUID 44X44 (DRAPES) ×4 IMPLANT
DRSG OPSITE POSTOP 4X6 (GAUZE/BANDAGES/DRESSINGS) ×2 IMPLANT
ELECT REM PT RETURN 15FT ADLT (MISCELLANEOUS) ×4 IMPLANT
GAUZE 4X4 16PLY RFD (DISPOSABLE) ×2 IMPLANT
GLOVE BIO SURGEON STRL SZ 6.5 (GLOVE) ×3 IMPLANT
GLOVE BIO SURGEONS STRL SZ 6.5 (GLOVE) ×1
GLOVE BIOGEL M STRL SZ7.5 (GLOVE) ×4 IMPLANT
GLOVE INDICATOR 6.5 STRL GRN (GLOVE) ×4 IMPLANT
GOWN STRL REUS W/TWL LRG LVL3 (GOWN DISPOSABLE) ×4 IMPLANT
KIT TURNOVER KIT A (KITS) ×2 IMPLANT
MANIFOLD NEPTUNE II (INSTRUMENTS) ×4 IMPLANT
NEEDLE HYPO 22GX1.5 SAFETY (NEEDLE) ×2 IMPLANT
NS IRRIG 1000ML POUR BTL (IV SOLUTION) ×6 IMPLANT
PACK CYSTO (CUSTOM PROCEDURE TRAY) ×4 IMPLANT
STRIP CLOSURE SKIN 1/2X4 (GAUZE/BANDAGES/DRESSINGS) ×1 IMPLANT
SUT MNCRL 0 MO-4 VIOLET 18 CR (SUTURE) ×6 IMPLANT
SUT MNCRL 0 VIOLET 6X18 (SUTURE) ×2 IMPLANT
SUT MON AB 2-0 SH 27 (SUTURE)
SUT MON AB 2-0 SH27 (SUTURE) ×4 IMPLANT
SUT MONOCRYL 0 6X18 (SUTURE)
SUT MONOCRYL 0 MO 4 18  CR/8 (SUTURE)
SUT PLAIN 2 0 XLH (SUTURE) ×2 IMPLANT
SUT VIC AB 0 CT1 18XCR BRD 8 (SUTURE) IMPLANT
SUT VIC AB 0 CT1 27 (SUTURE) ×12
SUT VIC AB 0 CT1 27XBRD ANTBC (SUTURE) IMPLANT
SUT VIC AB 0 CT1 8-18 (SUTURE) ×4
SUT VIC AB 3-0 PS2 18 (SUTURE) ×4
SUT VIC AB 3-0 PS2 18XBRD (SUTURE) IMPLANT
SYR CONTROL 10ML LL (SYRINGE) ×4 IMPLANT
TOWEL OR 17X26 10 PK STRL BLUE (TOWEL DISPOSABLE) ×4 IMPLANT
TUBING CONNECTING 10 (TUBING) ×1 IMPLANT
TUBING CONNECTING 10' (TUBING) ×1
WATER STERILE IRR 1000ML POUR (IV SOLUTION) ×4 IMPLANT
WATER STERILE IRR 3000ML UROMA (IV SOLUTION) ×4 IMPLANT

## 2019-03-30 NOTE — Brief Op Note (Signed)
03/30/2019  8:50 AM  PATIENT:  Kristin Bradford  63 y.o. female  PRE-OPERATIVE DIAGNOSIS:  Fibroids  POST-OPERATIVE DIAGNOSIS:  Fibroids  PROCEDURE:  Procedure(s): ABDOMINAL MYOMECTOMY (N/A) CYSTOSCOPY/HYDRODISTENSION (N/A)  SURGEON:  Surgeon(s) and Role: Panel 1:    * Dian Queen, MD - Primary    * Linda Hedges, DO - Assisting Panel 2:    * Alexis Frock, MD - Primary  PHYSICIAN ASSISTANT:   ASSISTANTS: none   ANESTHESIA:   general  EBL:  Per anesthesia    BLOOD ADMINISTERED:none  DRAINS: Urinary Catheter (Foley)   LOCAL MEDICATIONS USED:  MARCAINE     SPECIMEN:  Source of Specimen:  fibroids  DISPOSITION OF SPECIMEN:  PATHOLOGY  COUNTScorrect TOURNIQUET:  * No tourniquets in log *  DICTATION: .Other Dictation: Dictation Number dictated  PLAN OF CARE: Admit to inpatient   PATIENT DISPOSITION:  PACU - hemodynamically stable.   Delay start of Pharmacological VTE agent (>24hrs) due to surgical blood loss or risk of bleeding: yes

## 2019-03-30 NOTE — Anesthesia Procedure Notes (Signed)
Procedure Name: Intubation Date/Time: 03/30/2019 7:55 AM Performed by: Eben Burow, CRNA Pre-anesthesia Checklist: Patient identified, Emergency Drugs available, Suction available, Patient being monitored and Timeout performed Patient Re-evaluated:Patient Re-evaluated prior to induction Oxygen Delivery Method: Circle system utilized Preoxygenation: Pre-oxygenation with 100% oxygen Induction Type: IV induction Ventilation: Mask ventilation without difficulty Laryngoscope Size: Mac and 4 Grade View: Grade I Tube type: Oral Tube size: 7.0 mm Number of attempts: 1 Airway Equipment and Method: Stylet Placement Confirmation: ETT inserted through vocal cords under direct vision,  positive ETCO2 and breath sounds checked- equal and bilateral Secured at: 21 cm Tube secured with: Tape Dental Injury: Teeth and Oropharynx as per pre-operative assessment

## 2019-03-30 NOTE — Anesthesia Procedure Notes (Deleted)
Anesthesia Regional Block: Popliteal block   Pre-Anesthetic Checklist: ,, timeout performed, Correct Patient, Correct Site, Correct Laterality, Correct Procedure, Correct Position, site marked, Risks and benefits discussed,  Surgical consent,  Pre-op evaluation,  At surgeon's request and post-op pain management  Laterality: Right  Prep: Maximum Sterile Barrier Precautions used, chloraprep       Needles:  Injection technique: Single-shot  Needle Type: Echogenic Stimulator Needle     Needle Length: 9cm  Needle Gauge: 22     Additional Needles:   Procedures:,,,, ultrasound used (permanent image in chart),,,,  Narrative:  Start time: 03/30/2019 7:11 AM End time: 03/30/2019 7:21 AM Injection made incrementally with aspirations every 5 mL.  Performed by: Personally  Anesthesiologist: Freddrick March, MD  Additional Notes: Monitors applied. No increased pain on injection. No increased resistance to injection. Injection made in 5cc increments. Good needle visualization. Patient tolerated procedure well.

## 2019-03-30 NOTE — H&P (Signed)
Kristin Bradford is an 63 y.o. female.    Chief Complaint:   HPI:   1 - Interstitial Cystitis - >30 years of flairs of irritative voiding that respond to hydrodistention every 2-3 years. Do seem to correlated with stressors. Also some significant improvement with PRN urinary analgesics such as pyridium / AZO.   2 - Gross Hematuria - new gross hematuria x few 2018. Non-smoker. No chronic solvent exposure. Hematuria CT unremarkable (few fibroids noted). Cysto unremarkable, some mass effect form fibroid. UCX at time Citrobacter and treated.   PMH sig for hip replacement, acoustic neruoma resection, PAF (ASA and B Blocker only, follows [facility]) Her PCP is Derrill Memo with [facility].   Today "Kristin Bradford" is seen to proceed with cystoscopy and hydrodistension at time of abdominal myomectomy for large symptomatic fibroid. No interval fevers. It has been about 4 years since her last HOD.    Past Medical History:  Diagnosis Date  . A-fib (Foley)   . Acquired deafness of left ear    S/P  RESECTION ACOUSTIC NEUROMA  2004  . Acquired facial asymmetry    post surgery  . Dysrhythmia    a fib after surgery  . GERD (gastroesophageal reflux disease)   . Gestational diabetes mellitus    gestastional  . Headache    "weekly" (05/22/2015)  . 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 Hyperlipidemia   . Hypothyroidism    hx of no meds now  . IC (interstitial cystitis)   . Migraine    "q several months" (05/22/2015)  . Neuromuscular disorder (HCC)    tremor to right side when upset   . PONV (postoperative nausea and vomiting)    atrial fib  . Syncope     Past Surgical History:  Procedure Laterality Date  . ACOUSTIC NEUROMA RESECTION Left 2004  . BRAIN SURGERY    . CATARACT EXTRACTION W/ INTRAOCULAR LENS IMPLANT Bilateral 01/2014  . COLONOSCOPY  11/2014  . CYSTO WITH HYDRODISTENSION N/A 05/18/2015   Procedure: CYSTOSCOPY/HYDRODISTENSION;  Surgeon: Carolan Clines, MD;  Location: [facility];  Service: Urology;  Laterality: N/A;  . CYSTO/  HYDRODISTENTION/  INSTILLATION THERAPY  x4  last one 05-16-2010   since 1990's  . ESOPHAGOGASTRODUODENOSCOPY  11/2014  . HEMANGIOMA EXCISION Left 2005    FACIAL ANGIOMA REMOVAL / MASTOIDECTOMY  . HEMORRHOID BANDING  05/2015  . MASTOIDECTOMY Left 2005   LEFT FACIAL ANGIOMA REMOVAL /  LEFT MASTOIDECTOMY [Other]  . TOTAL HIP ARTHROPLASTY Left 10/24/2016   Procedure: LEFT TOTAL HIP ARTHROPLASTY ANTERIOR APPROACH;  Surgeon: Mcarthur Rossetti, MD;  Location: [facility];  Service: Orthopedics;  Laterality: Left;  . [place]   "all 4"    Family History  Adopted: Yes   Social History:  reports that she has never smoked. She has never used smokeless tobacco. She reports current alcohol use of about 1.0 standard drinks of alcohol per week. She reports that she does not use drugs.  Allergies:  Allergies  Allergen Reactions  . Fentanyl Nausea And Vomiting  . Betadine [Povidone Iodine] Rash  . Codeine Nausea And Vomiting  . Other     Steroid injections make her hair fall out  . Penicillins Itching    Has patient had a PCN reaction causing immediate rash, facial/tongue/throat swelling, SOB or lightheadedness with hypotension: Yes Has patient had a PCN reaction causing severe rash involving mucus membranes or skin necrosis:  No Has patient had a PCN reaction that required hospitalization No Has patient had a PCN reaction occurring within the last 10 years: No If all of the above answers are "NO", then may proceed with Cephalosporin use.   . Tetanus Toxoids Nausea And Vomiting and Other (See Comments)    High fever    Medications Prior to Admission  Medication Sig Dispense Refill  . COLLAGEN PO Take 0.5 Scoops by mouth daily.    Marland Kitchen escitalopram (LEXAPRO) 10 MG tablet Take 10 mg by mouth daily.    . metoprolol tartrate (LOPRESSOR) 25 MG tablet Take 0.5 tablets (12.5 mg  total) by mouth 2 (two) times daily as needed (fast or irregular heart rate). 90 tablet 3  . polyethylene glycol (MIRALAX / GLYCOLAX) packet Take 17 g by mouth daily.    . progesterone (PROMETRIUM) 100 MG capsule Take 100 mg by mouth at bedtime.    . rosuvastatin (CRESTOR) 5 MG tablet Take 5 mg by mouth daily.    Marland Kitchen ALPRAZolam (XANAX) 0.5 MG tablet Take 0.25 mg by mouth daily as needed for anxiety.   0    No results found for this or any previous visit (from the past 48 hour(s)). No results found.  Review of Systems  Constitutional: Negative for chills and fever.  Genitourinary: Positive for urgency.    Blood pressure 110/63, pulse 65, temperature 98.3 F (36.8 C), temperature source Oral, resp. rate 17, height 5\' 11"  (1.803 m), weight 92.5 kg, SpO2 99 %. Physical Exam  Constitutional: She appears well-developed.  HENT:  Head: Normocephalic.  Eyes: Pupils are equal, round, and reactive to light.  Neck: Normal range of motion.  Cardiovascular: Normal rate.  Respiratory: Effort normal.  GI: Soft.  Genitourinary:    Genitourinary Comments: No CVAT at present.    Musculoskeletal: Normal range of motion.  Neurological: She is alert.  Skin: Skin is warm.  Psychiatric: She has a normal mood and affect.     Assessment/Plan  Proceed as planned with cysto, hydrodistension at time of myomectomy in combined GYN / Urol surgery. Risks, benefits, alternatives, expected peri-op course discussed previously and reiterated today.   Alexis Frock, MD 03/30/2019, 6:48 AM

## 2019-03-30 NOTE — Transfer of Care (Signed)
Immediate Anesthesia Transfer of Care Note  Patient: Kristin Bradford  Procedure(s) Performed: ABDOMINAL MYOMECTOMY (N/A Abdomen) CYSTOSCOPY/HYDRODISTENSION (N/A Urethra)  Patient Location: PACU  Anesthesia Type:General  Level of Consciousness: awake, alert  and oriented  Airway & Oxygen Therapy: Patient Spontanous Breathing and Patient connected to face mask oxygen  Post-op Assessment: Report given to RN and Post -op Vital signs reviewed and stable  Post vital signs: Reviewed and stable  Last Vitals:  Vitals Value Taken Time  BP 108/61 03/30/19 0918  Temp    Pulse 56 03/30/19 0921  Resp 22 03/30/19 0921  SpO2 100 % 03/30/19 0921  Vitals shown include unvalidated device data.  Last Pain:  Vitals:   03/30/19 0617  TempSrc:   PainSc: 0-No pain         Complications: No apparent anesthesia complications

## 2019-03-30 NOTE — Anesthesia Postprocedure Evaluation (Signed)
Anesthesia Post Note  Patient: Kristin Bradford  Procedure(s) Performed: ABDOMINAL MYOMECTOMY (N/A Abdomen) CYSTOSCOPY/HYDRODISTENSION (N/A Urethra)     Patient location during evaluation: PACU Anesthesia Type: General Level of consciousness: awake and alert Pain management: pain level controlled Vital Signs Assessment: post-procedure vital signs reviewed and stable Respiratory status: spontaneous breathing, nonlabored ventilation, respiratory function stable and patient connected to nasal cannula oxygen Cardiovascular status: blood pressure returned to baseline and stable Postop Assessment: no apparent nausea or vomiting Anesthetic complications: no    Last Vitals:  Vitals:   03/30/19 0945 03/30/19 1000  BP: 113/62   Pulse: (!) 52   Resp: 20   Temp:  (!) 36.4 C  SpO2: 100%     Last Pain:  Vitals:   03/30/19 1000  TempSrc:   PainSc: 5                  [name] L Wladyslaw Henrichs

## 2019-03-30 NOTE — Progress Notes (Signed)
Patient resting  Pain is worse with movement  BP 130/72 (BP Location: Left Arm)   Pulse (!) 45   Temp 97.6 F (36.4 C) (Oral)   Resp 14   Ht 5\' 11"  (1.803 m)   Wt 95.7 kg   SpO2 100%   BMI 29.43 kg/m  No results found for this or any previous visit (from the past 24 hour(s)). Abdomen dressing is dry   POD # 0  Doing well Surgery findings reviewed with patient Routine post op care Plan on discharge home tomorrow

## 2019-03-30 NOTE — Brief Op Note (Signed)
03/30/2019  9:07 AM  PATIENT:  Kristin Bradford  63 y.o. female  PRE-OPERATIVE DIAGNOSIS:  Interstitial Cystitis  POST-OPERATIVE DIAGNOSIS:  Interstitial Cystitis  PROCEDURE:  Cystoscopy with Hydrodistension  SURGEON:  Surgeon(s) and Role:  Alexis Frock, MD - Primary  PHYSICIAN ASSISTANT:   ASSISTANTS: none   ANESTHESIA:   general  EBL:  minimal   BLOOD ADMINISTERED:none  DRAINS: none   LOCAL MEDICATIONS USED:  NONE  SPECIMEN:  No Specimen  DISPOSITION OF SPECIMEN:  N/A  COUNTS:  YES  TOURNIQUET:  * No tourniquets in log *  DICTATION: .Other Dictation: Dictation Number [id number]  PLAN OF CARE: PAC for recovery  PATIENT DISPOSITION:  PACU - hemodynamically stable.   Delay start of Pharmacological VTE agent (>24hrs) due to surgical blood loss or risk of bleeding: not applicable

## 2019-03-30 NOTE — Discharge Instructions (Signed)
1 - You may have urinary urgency (bladder spasms) and bloody urine on / off for up to 2 weeks.  This is normal.  2 - Call MD or go to ER for fever >102, severe pain / nausea / vomiting not relieved by medications, or acute change in medical status  

## 2019-03-30 NOTE — Progress Notes (Signed)
H and P updated No changes Will proceed with abdominal myomectomy today Telemetry bed postop  Tylenol / Tramadol and Robaxin postop

## 2019-03-31 ENCOUNTER — Encounter (HOSPITAL_COMMUNITY): Payer: Self-pay | Admitting: Obstetrics and Gynecology

## 2019-03-31 DIAGNOSIS — N3011 Interstitial cystitis (chronic) with hematuria: Secondary | ICD-10-CM | POA: Diagnosis not present

## 2019-03-31 LAB — CBC
HCT: 34.8 % — ABNORMAL LOW (ref 36.0–46.0)
Hemoglobin: 11.2 g/dL — ABNORMAL LOW (ref 12.0–15.0)
MCH: 29.7 pg (ref 26.0–34.0)
MCHC: 32.2 g/dL (ref 30.0–36.0)
MCV: 92.3 fL (ref 80.0–100.0)
Platelets: 229 10*3/uL (ref 150–400)
RBC: 3.77 MIL/uL — ABNORMAL LOW (ref 3.87–5.11)
RDW: 13.2 % (ref 11.5–15.5)
WBC: 12.5 10*3/uL — ABNORMAL HIGH (ref 4.0–10.5)
nRBC: 0 % (ref 0.0–0.2)

## 2019-03-31 LAB — SURGICAL PATHOLOGY

## 2019-03-31 MED ORDER — METHOCARBAMOL 750 MG PO TABS: 750.0000 mg | ORAL_TABLET | Freq: Four times a day (QID) | ORAL | 0 refills | Status: DC | PRN

## 2019-03-31 MED FILL — METHOCARBAMOL 750 MG TABS: 750 | 8 days supply | Qty: 30 | Fill #0

## 2019-03-31 NOTE — Discharge Summary (Signed)
Admission Diagnosis: Symptomatic Fibroids Interstitial Cystitis  Discharge Diagnosis: Same  Hospital Course: 63 year old female here for abdominal myomectomy . She had uncomplicated procedure on POD # 0. By POD # 1 she was ambulating, voiding and had good pain control.  BP (!) 116/57 (BP Location: Right Arm)   Pulse 61   Temp 99.3 F (37.4 C) (Oral)   Resp 18   Ht 5\' 11"  (1.803 m)   Wt 95.7 kg   SpO2 95%   BMI 29.43 kg/m  Results for orders placed or performed during the hospital encounter of 03/30/19 (from the past 24 hour(s))  CBC     Status: Abnormal   Collection Time: 03/31/19  5:05 AM  Result Value Ref Range   WBC 12.5 (H) 4.0 - 10.5 K/uL   RBC 3.77 (L) 3.87 - 5.11 MIL/uL   Hemoglobin 11.2 (L) 12.0 - 15.0 g/dL   HCT 34.8 (L) 36.0 - 46.0 %   MCV 92.3 80.0 - 100.0 fL   MCH 29.7 26.0 - 34.0 pg   MCHC 32.2 30.0 - 36.0 g/dL   RDW 13.2 11.5 - 15.5 %   Platelets 229 150 - 400 K/uL   nRBC 0.0 0.0 - 0.2 %   Abdomen soft and flat and non tender Bandage clean and dry and intact  Patient discharged home  Rx robaxin  Follow up in 1 week

## 2019-03-31 NOTE — Progress Notes (Signed)
Pt to be discharged to home this morning. Discharge teaching including Medications and schedules reviewed with Pt. Pt verbalized understanding of all discharge teaching/instrucyions. Discharge packet with Pt at time of discharge

## 2019-03-31 NOTE — Op Note (Signed)
NAME: Kristin Bradford, Kristin Bradford MEDICAL RECORD [id number] ACCOUNT [id number] DATE OF BIRTH:12-09-1955 FACILITY: [facility] LOCATION: [facility] PHYSICIAN:Lemoyne Nestor, MD  OPERATIVE REPORT  DATE OF PROCEDURE:  03/30/2019  PREOPERATIVE DIAGNOSIS:  Culture negative dysuria.  CLINICAL DIAGNOSIS:  Interstitial cystitis.  PROCEDURE:  Cystoscopy, hydrodistention.  SURGEON:  Alexis Frock, MD  ESTIMATED BLOOD LOSS:  Nil.  MEDICATIONS:  None.  SPECIMENS:  None.  FINDINGS: 1.  650 mL capacity bladder. 2.  Moderate to severe glomerulations following cystoscopy. 3.  No evidence of bladder perforation following hydrodistention.  INDICATIONS:  The patient is a pleasant 63 year old woman with long history of culture negative dysuria and a clinical diagnosis of hydrodistention.  She has had symptomatic benefit with hydrodistention on a periodic basis.  This was last performed  several years ago by a colleague of mine.  She is undergoing elective resection of fibroids by the gynecology service today and she wishes to have hydrodistention performed concomitantly as she has had progressive worsening of her irritative symptoms.   Despite clearance of infectious parameters, she is certainly felt to be a suitable candidate for this.  She has cardiac clearance and informed consent was then placed in medical record.  DESCRIPTION OF PROCEDURE:  The patient being identified, procedure being cystoscopy, hydrodistention was confirmed.  Procedure timeout was performed.  Antibiotics administered.  The patient was already in [place] under general anesthesia, having undergone  a successful open myomectomy.  She was repositioned into a low lithotomy position, sterile field was created; prepped and draped the patient's vagina, introitus and proximal thighs using noniodinated prepping solution.  Cystourethroscopy was then  performed using a 21-French rigid cystoscope with offset lens.  Inspection of bladder revealed no  diverticula, calcifications, or papillary lesions.  Ureteral orifices were singleton bilaterally.  The bladder was gently filled with normal saline to a  pressure of approximately 100 cm of water, held for 90 seconds and then released.  Systematic capacity was approximately 650 mL.  Repeat cystoscopy revealed moderate to severe glomerulations.  Photodocumentation performed.  There was no evidence of  bladder perforation.  The bladder was emptied per cystoscope.  Procedure was then terminated.  The patient tolerated procedure well.  No immediate complications.  The patient taken to postanesthesia care in stable condition.  CN/NUANCE  D:03/30/2019 T:03/30/2019 JOB:[id number]

## 2019-04-04 NOTE — Op Note (Signed)
NAME: Kristin Bradford, Kristin Bradford MEDICAL RECORD [id number] ACCOUNT [id number] DATE OF BIRTH:[birth year] FACILITY: [facility] LOCATION: [facility] PHYSICIAN:Aman Bonet Lynett Fish, MD  OPERATIVE REPORT  DATE OF PROCEDURE:  03/30/2019  PREOPERATIVE DIAGNOSES:   1.  Symptomatic fibroids. 2.  Interstitial cystitis.  POSTOPERATIVE DIAGNOSES:   1.  Symptomatic fibroids. 2.  Interstitial cystitis.  PROCEDURE:  Abdominal myomectomies.  SURGEON:  Dian Queen, MD  ASSISTANT:  Dr. Arna Medici  ANESTHESIA:  General endotracheal anesthesia.  ESTIMATED BLOOD LOSS:  Less than 100 mL.  COMPLICATIONS:  None.  DESCRIPTION OF PROCEDURE:  The patient was taken to the operating room after informed consent was obtained.  She was then prepped and draped in the usual sterile fashion.  A Foley catheter was inserted.  Time-out had been performed.  A low transverse  incision was made, carried down to the fascia.  Fascia was scored in the midline and extended laterally.  Rectus muscles were separated in the midline.  The peritoneum was entered bluntly.  The peritoneal incision was then stretched.  The lower uterine  segment was identified and the bladder flap was created sharply and digitally.  Inspection after entering the peritoneum revealed that the uterus was myomatous and there was a large pedunculated fibroid and 2 smaller ones.  We then elevated the uterus  and then placed a tie across the base of the pedunculated fibroid.  We then placed a Heaney clamp above this, removed the fibroid and then suture ligated that pedicle with 0 Vicryl suture.  That area was hemostatic.  We then made a smaller incision in  the fundus of the uterus and removed 2 small fibroids less than 2 cm in diameter and closed that area with figure-of-eights using 0 Vicryl suture and hemostasis was very good.  Irrigation was performed.  Hemostasis was noted.  The adnexa were normal.  No  evidence of endometriosis or pelvic adhesions were noted.  We  then closed the peritoneum, closed the fascia using 0 Vicryl, closed the skin using 3-0 Vicryl on a Keith needle.  Steri-Strips were applied.  At this point, sponge, instrument and lap counts  were correct x2.  Myself and Dr. Laurance Flatten scrubbed out and Dr. Tresa Moore of urology scrubbed in and the remainder of the case will be dictated by him.  VN/NUANCE  D:04/04/2019 T:04/04/2019 JOB:[id number]

## 2019-04-08 MED FILL — ROSUVASTATIN CALCIUM 5 MG T: 5 | 30 days supply | Qty: 30 | Fill #2

## 2019-04-08 MED FILL — PROGESTERONE 100 MG CAPSULE: 100 | 30 days supply | Qty: 30 | Fill #6

## 2019-04-11 MED FILL — ESCITALOPRAM 10 MG TABLET: 10 | 30 days supply | Qty: 30 | Fill #0

## 2019-04-19 MED FILL — NITROFURANTOIN MONO-MCR 100: 100 | 7 days supply | Qty: 14 | Fill #0

## 2019-05-10 MED FILL — CIPROFLOXACIN HCL 500 MG TA: 500 | 7 days supply | Qty: 14 | Fill #0

## 2019-05-12 MED FILL — PROGESTERONE 100 MG CAPSULE: 100 | 30 days supply | Qty: 30 | Fill #7

## 2019-05-12 MED FILL — ESCITALOPRAM 10 MG TABLET: 10 | 30 days supply | Qty: 30 | Fill #1

## 2019-05-31 MED FILL — ROSUVASTATIN CALCIUM 5 MG T: 5 | 30 days supply | Qty: 30 | Fill #3

## 2019-06-13 MED FILL — PROGESTERONE 100 MG CAPSULE: 100 | 30 days supply | Qty: 30 | Fill #8

## 2019-06-13 MED FILL — ESCITALOPRAM 10 MG TABLET: 10 | 30 days supply | Qty: 30 | Fill #2

## 2019-06-28 ENCOUNTER — Encounter: Payer: Self-pay | Admitting: Allergy & Immunology

## 2019-06-28 ENCOUNTER — Other Ambulatory Visit: Payer: Self-pay

## 2019-06-28 ENCOUNTER — Ambulatory Visit: Payer: PRIVATE HEALTH INSURANCE | Admitting: Allergy & Immunology

## 2019-06-28 VITALS — BP 140/70 | HR 66 | Temp 97.3°F | Resp 18 | Ht 71.0 in | Wt 204.0 lb

## 2019-06-28 DIAGNOSIS — J302 Other seasonal allergic rhinitis: Secondary | ICD-10-CM | POA: Diagnosis not present

## 2019-06-28 DIAGNOSIS — J3089 Other allergic rhinitis: Secondary | ICD-10-CM

## 2019-06-28 DIAGNOSIS — T7840XD Allergy, unspecified, subsequent encounter: Secondary | ICD-10-CM

## 2019-06-28 DIAGNOSIS — K1379 Other lesions of oral mucosa: Secondary | ICD-10-CM | POA: Diagnosis not present

## 2019-06-28 NOTE — Progress Notes (Signed)
NEW PATIENT  Date of Service/Encounter:  06/30/19  Referring provider: Mayra Neer, MD   Assessment:   Allergic reaction  Perennial and seasonal allergic rhinitis (ragweed, weeds, indoor molds, outdoor molds, dust mites, cat and dog)  Recurrent oral ulcers  History of acoustic neuroma  History of uterine fibroids  Plan/Recommendations:   1. Allergic reaction with recurrent lip swelling and oral ulcers - I have no idea what is going on with you. - Testing to the most common foods was completely negative. - This rules out around 97% of all food allergies. - Copy of test results provided. - I am going to get some autoimmune screening labs, such as an ANA as well as inflammatory markers. - Take a picture the next time this happens or call to make an emergent appointment. - I would like to get a good look at this when it occurs.  2. Perennial allergic rhinitis - Testing today showed: ragweed, weeds, indoor molds, outdoor molds, dust mites, cat and dog - Copy of test results provided.  - Avoidance measures provided. - I am unsure how this explains your symptoms, but maybe this will give you insight into what is going.  - Start taking: Zyrtec (cetirizine) 10mg  tablet once daily at the first sign of a reaction to see if this can blunt the response.  - I do not think that allergy shots would be indicated with your history.  3. Return in about 3 months (around 09/26/2019). This can be an in-person, a virtual Webex or a telephone follow up visit.  Subjective:   Kristin Bradford is a 64 y.o. female presenting today for evaluation of  Chief Complaint  Patient presents with  . Allergic Reaction  . Pruritus  . Angioedema  . Rash    Kristin Bradford has a history of the following: Patient Active Problem List   Diagnosis Date Noted  . Fibroids 03/30/2019  . S/P myomectomy 03/30/2019  . 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 Chest pain 10/24/2016  .  Unilateral primary osteoarthritis, left hip 10/06/2016  . Faintness   . Syncope 05/21/2015  . Nausea and vomiting 05/21/2015  . Diarrhea 05/21/2015  . Gastroenteritis 05/21/2015  . IC (interstitial cystitis)   . Atrial fibrillation, new onset ([place]) 05/18/2015  . Atrial fibrillation ([place]) 05/18/2015  . Hypothyroidism 05/18/2015  . Interstitial cystitis 05/18/2015  . Acoustic neuroma ([place]) 05/18/2015    History obtained from: chart review and patient.  Kristin Bradford was referred by Mayra Neer, MD.     Adrain is a 64 y.o. female presenting for an evaluation of allergic reactions. She has had 3-4 episodes in total. These have ranged from skin changes under her arms to lip changes and ulcers. There is no common link between what she is exposed to and when the reactions occur.   She reports that she has had a problem with circles under her arms. This started around 2-3 years ago. She thought it was a yeast infection, so she took a Diflucan and some kind of topical anti-fungal (Lotrimin). Within 7-10 days, it had cleared up. Most nights, it did keep her awake. She almost "felt" that she had a yeast infection.   The time before last, she had an ulcer under her tongue. The last time she had it was a couple of months ago. She woke up four weeks after surgery. She had bright red circles across her bottom lip. This was felt to be an allergic  reaction to an unknown trigger. She had been home for four weeks and not outside any of those times. The ulcer lasted around one week and there were two days where she could not talk. This was on the outside of her tongue along the left side. She did not use any magic mouthwash or anything else. She is unsure what she had eaten that day.  She was put on a prednisone taper after the last reaction in November 2020.  Allergic Rhinitis Symptom History: She does have rhinorrhea constantly. She tries to take as little as possible regarding her medications. She has  never been tested and has never been on allergen immunotherapy.   She has no autoimmune history. She denies any history of Crohn's disease or Bechet's disease. She denies any GI symptoms. Apparently she had surgery for uterine fibroids in the last year, actually a few weeks before her most recent reaction with the ulcers and rash. She does not have pictures of this rash unfortunately, aside from some hyperpigmentation in her axillary region.   Otherwise, there is no history of other atopic diseases, including asthma, food allergies, stinging insect allergies, eczema, urticaria or contact dermatitis. There is no significant infectious history. Vaccinations are up to date.    Past Medical History: Patient Active Problem List   Diagnosis Date Noted  . Fibroids 03/30/2019  . S/P myomectomy 03/30/2019  . 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 Chest pain 10/24/2016  . Unilateral primary osteoarthritis, left hip 10/06/2016  . Faintness   . Syncope 05/21/2015  . Nausea and vomiting 05/21/2015  . Diarrhea 05/21/2015  . Gastroenteritis 05/21/2015  . IC (interstitial cystitis)   . Atrial fibrillation, new onset ([place]) 05/18/2015  . Atrial fibrillation ([place]) 05/18/2015  . Hypothyroidism 05/18/2015  . Interstitial cystitis 05/18/2015  . Acoustic neuroma ([place]) 05/18/2015    Medication List:  Allergies as of 06/28/2019      Reactions   Fentanyl Nausea And Vomiting   Betadine [povidone Iodine] Rash   Codeine Nausea And Vomiting   Other    Steroid injections make her hair fall out   Penicillins Itching   Has patient had a PCN reaction causing immediate rash, facial/tongue/throat swelling, SOB or lightheadedness with hypotension: Yes Has patient had a PCN reaction causing severe rash involving mucus membranes or skin necrosis: No Has patient had a PCN reaction that required hospitalization No Has patient had a PCN reaction occurring within the last 10 years: No If all of the  above answers are "NO", then may proceed with Cephalosporin use.   Tetanus Toxoids Nausea And Vomiting, Other (See Comments)   High fever      Medication List       Accurate as of June 28, 2019 11:59 PM. If you have any questions, ask your nurse or doctor.        STOP taking these medications   methocarbamol 750 MG tablet Commonly known as: ROBAXIN Stopped by: Valentina Shaggy, MD     TAKE these medications   ALPRAZolam 0.5 MG tablet Commonly known as: XANAX Take 0.25 mg by mouth daily as needed for anxiety.   AZO-CRANBERRY PO Take 1 tablet by mouth daily.   COLLAGEN PO Take 0.5 Scoops by mouth daily.   escitalopram 10 MG tablet Commonly known as: LEXAPRO Take 10 mg by mouth daily.   metoprolol tartrate 25 MG tablet Commonly known as: LOPRESSOR Take 0.5 tablets (12.5 mg total) by mouth 2 (two) times daily as  needed (fast or irregular heart rate).   multivitamin tablet Take 1 tablet by mouth daily.   polyethylene glycol 17 g packet Commonly known as: MIRALAX / GLYCOLAX Take 17 g by mouth daily.   progesterone 100 MG capsule Commonly known as: PROMETRIUM Take 100 mg by mouth at bedtime.   rosuvastatin 5 MG tablet Commonly known as: CRESTOR Take 5 mg by mouth daily.       Birth History: non-contributory  Developmental History: non-contributory  Past Surgical History: Past Surgical History:  Procedure Laterality Date  . ACOUSTIC NEUROMA RESECTION Left 2004  . BRAIN SURGERY    . CATARACT EXTRACTION W/ INTRAOCULAR LENS IMPLANT Bilateral 01/2014  . COLONOSCOPY  11/2014  . CYSTO WITH HYDRODISTENSION N/A 05/18/2015   Procedure: CYSTOSCOPY/HYDRODISTENSION;  Surgeon: Carolan Clines, MD;  Location: [facility];  Service: Urology;  Laterality: N/A;  . CYSTO WITH HYDRODISTENSION N/A 03/30/2019   Procedure: CYSTOSCOPY/HYDRODISTENSION;  Surgeon: Alexis Frock, MD;  Location: [facility];  Service: Urology;  Laterality: N/A;  . CYSTO/   HYDRODISTENTION/  INSTILLATION THERAPY  x4  last one 05-16-2010   since 1990's  . ESOPHAGOGASTRODUODENOSCOPY  11/2014  . HEMANGIOMA EXCISION Left 2005    FACIAL ANGIOMA REMOVAL / MASTOIDECTOMY  . HEMORRHOID BANDING  05/2015  . MASTOIDECTOMY Left 2005   LEFT FACIAL ANGIOMA REMOVAL /  LEFT MASTOIDECTOMY [Other]  . MYOMECTOMY N/A 03/30/2019   Procedure: ABDOMINAL MYOMECTOMY;  Surgeon: Dian Queen, MD;  Location: [facility];  Service: Gynecology;  Laterality: N/A;  . TOTAL HIP ARTHROPLASTY Left 10/24/2016   Procedure: LEFT TOTAL HIP ARTHROPLASTY ANTERIOR APPROACH;  Surgeon: Mcarthur Rossetti, MD;  Location: [facility];  Service: Orthopedics;  Laterality: Left;  . [place]   "all 4"     Family History: Family History  Adopted: Yes     Social History: Gazelle lives at home with her family.  Patient was in a townhome that is 62 years old.  There is hardwood and area rugs in the main living areas in the bedroom.  She has gas heating as well as central cooling with window units as well as fans.  There are 2 cats in the home.  There are dust mite covers on the bed, but not the pillows.  There is no tobacco exposure.  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   Review of Systems  Constitutional: Negative.  Negative for chills, fever, malaise/fatigue and weight loss.  HENT: Negative.  Negative for congestion, ear discharge and ear pain.   Eyes: Negative for pain, discharge and redness.  Respiratory: Negative for cough, sputum production, shortness of breath and wheezing.   Cardiovascular: Negative.  Negative for chest pain and palpitations.  Gastrointestinal: Negative for abdominal pain, constipation, diarrhea, heartburn, nausea and vomiting.  Skin: Positive for itching and rash.       Positive for intermittent ulcers.  Neurological: Negative for dizziness and headaches.  Endo/Heme/Allergies:  Negative for environmental allergies. Does not bruise/bleed easily.       Objective:   Blood pressure 140/70, pulse 66, temperature (!) 97.3 F (36.3 C), temperature source Temporal, resp. rate 18, height 5\' 11"  (1.803 m), weight 204 lb (92.5 kg), SpO2 100 %. Body mass index is 28.45 kg/m.   Physical Exam:   Physical Exam  Constitutional: She appears well-developed.  Very pleasant talkative female.  HENT:  Head: Normocephalic and atraumatic.  Right Ear: Tympanic membrane,  external ear and ear canal normal. No drainage, swelling or tenderness. Tympanic membrane is not injected, not scarred, not erythematous, not retracted and not bulging.  Left Ear: Tympanic membrane, external ear and ear canal normal. No drainage, swelling or tenderness. Tympanic membrane is not injected, not scarred, not erythematous, not retracted and not bulging.  Nose: Mucosal edema and rhinorrhea present. No nasal deformity or septal deviation. No epistaxis. Right sinus exhibits no maxillary sinus tenderness and no frontal sinus tenderness. Left sinus exhibits no maxillary sinus tenderness and no frontal sinus tenderness.  Mouth/Throat: Uvula is midline and oropharynx is clear and moist. Mucous membranes are not pale and not dry.  No sinus tenderness.  No polyps.  Eyes: Pupils are equal, round, and reactive to light. Conjunctivae and EOM are normal. Right eye exhibits no chemosis and no discharge. Left eye exhibits no chemosis and no discharge. Right conjunctiva is not injected. Left conjunctiva is not injected.  Cardiovascular: Normal rate, regular rhythm and normal heart sounds.  Respiratory: Effort normal and breath sounds normal. No accessory muscle usage. No tachypnea. No respiratory distress. She has no wheezes. She has no rhonchi. She has no rales. She exhibits no tenderness.  Moving air well in all lung fields.  GI: There is no abdominal tenderness. There is no rebound and no guarding.  Lymphadenopathy:        Head (right side): No submandibular, no tonsillar and no occipital adenopathy present.       Head (left side): No submandibular, no tonsillar and no occipital adenopathy present.    She has no cervical adenopathy.  Neurological: She is alert.  Skin: No abrasion, no petechiae and no rash noted. Rash is not papular, not vesicular and not urticarial. No erythema. No pallor.  No rash or ulcers today.  She does have hyperpigmented rash and somewhat a circular design under her axilla.  There is no draining, raised appearance, or erythema noted.  Psychiatric: She has a normal mood and affect.     Diagnostic studies:   Allergy Studies:    Airborne Adult Perc - 06/28/19 1501    Time Antigen Placed  1501    Allergen Manufacturer  Lavella Hammock    Location  Back    Number of Test  59    Panel 1  Select    1. Control-Buffer 50% Glycerol  Negative    2. Control-Histamine 1 mg/ml  2+    3. Albumin saline  Negative    4. [place]  Negative    5. [place]  Negative    6. [place]  Negative    7. [place] Blue  Negative    8. Meadow Fescue  Negative    9. Perennial Rye  Negative    10. Sweet Vernal  Negative    11. Timothy  Negative    12. Cocklebur  Negative    13. Burweed Marshelder  Negative    14. Ragweed, short  Negative    15. Ragweed, Giant  Negative    16. Plantain,  English  Negative    17. Lamb's Quarters  Negative    18. Sheep Sorrell  Negative    19. Rough Pigweed  Negative    20. Marsh Elder, Rough  Negative    21. Mugwort, Common  Negative    22. Ash mix  Negative    23. Birch mix  Negative    24. Beech American  Negative    25. Box, Elder  Negative    26. Cedar, red  Negative    27. Cottonwood, [place]  Negative    28. Elm mix  Negative    29. Hickory mix  Negative    30. Maple mix  Negative    31. Oak, [place] mix  Negative    32. Pecan Pollen  Negative    33. Pine mix  Negative    34. Sycamore Eastern  Negative    35. [place], Black Pollen  Negative    36. Alternaria  alternata  Negative    37. Cladosporium Herbarum  Negative    38. Aspergillus mix  Negative    39. Penicillium mix  Negative    40. Bipolaris sorokiniana (Helminthosporium)  Negative    41. Drechslera spicifera (Curvularia)  Negative    42. Mucor plumbeus  Negative    43. Fusarium moniliforme  Negative    44. Aureobasidium pullulans (pullulara)  Negative    45. Rhizopus oryzae  Negative    46. Botrytis cinera  Negative    47. Epicoccum nigrum  Negative    48. Phoma betae  Negative    49. Candida Albicans  Negative    50. Trichophyton mentagrophytes  Negative    51. Mite, D Farinae  5,000 AU/ml  2+    52. Mite, D Pteronyssinus  5,000 AU/ml  2+    53. Cat Hair 10,000 BAU/ml  Negative    54.  Dog Epithelia  Negative    55. Mixed Feathers  Negative    56. Horse Epithelia  Negative    57. Cockroach, German  Negative    58. Mouse  Negative    59. Tobacco Leaf  Negative     Food Perc - 06/28/19 1502    Time Antigen Placed  1502    Allergen Manufacturer  Lavella Hammock    Location  Back    Number of allergen test  10    Food  Select    1. Peanut  Negative    2. Soybean food  Negative    3. Wheat, whole  Negative    4. Sesame  Negative    5. Milk, cow  Negative    6. Egg White, chicken  Negative    7. Casein  Negative    8. Shellfish mix  Negative    9. Fish mix  Negative    10. Cashew  Negative     Intradermal - 06/28/19 1611    Time Antigen Placed  1611    Allergen Manufacturer  Lavella Hammock    Location  Arm    Number of Test  14    Intradermal  Select    Control  Negative    [place]  Negative    Johnson  Negative    7 Grass  Negative    Ragweed mix  2+    Weed mix  2+    Tree mix  Negative    Mold 1  2+    Mold 2  2+    Mold 3  Negative    Mold 4  3+    Cat  2+    Dog  3+    Cockroach  Negative    Mite mix  Omitted       Allergy testing results were read and interpreted by myself, documented by clinical staff.         Salvatore Marvel, MD [facility] [place]  [facility]

## 2019-06-28 NOTE — Patient Instructions (Addendum)
1. Allergic reaction with recurrent lip swelling and oral ulcers - I have no idea what is going on with you. - Testing to the most common foods was completely negative. - This rules out around 97% of all food allergies. - Copy of test results provided. - I am going to get some autoimmune screening labs, such as an ANA as well as inflammatory markers. - Take a picture the next time this happens or call to make an emergent appointment. - I would like to get a good look at this when it occurs.  2. Perennial allergic rhinitis - Testing today showed: ragweed, weeds, indoor molds, outdoor molds, dust mites, cat and dog - Copy of test results provided.  - Avoidance measures provided. - I am unsure how this explains your symptoms, but maybe this will give you insight into what is going.  - Start taking: Zyrtec (cetirizine) 10mg  tablet once daily at the first sign of a reaction to see if this can blunt the response.  - I do not think that allergy shots would be indicated with your history.  3. Return in about 3 months (around 09/26/2019). This can be an in-person, a virtual Webex or a telephone follow up visit.   Please inform us of any Emergency Department visits, hospitalizations, or changes in symptoms. Call us before going to the ED for breathing or allergy symptoms since we might be able to fit you in for a sick visit. Feel free to contact us anytime with any questions, problems, or concerns.  It was a pleasure to meet you today!  Websites that have reliable patient information: 1. American Academy of Asthma, Allergy, and Immunology: www.aaaai.org 2. Food Allergy Research and Education (FARE): foodallergy.org 3. Mothers of Asthmatics: http://www.asthmacommunitynetwork.org 4. American College of Allergy, Asthma, and Immunology: www.acaai.org   COVID-19 Vaccine Information can be found at: [email or web address] For questions related to  vaccine distribution or appointments, please email [email or web address] or call [phone].     "Like" [place] on Facebook and Instagram for our latest updates!        Make sure you are registered to vote! If you have moved or changed any of your contact information, you will need to get this updated before voting!  In some cases, you MAY be able to register to vote online: [email or web address]    Control of Dust Mite Allergen    Dust mites play a major role in allergic asthma and rhinitis.  They occur in environments with high humidity wherever human skin is found.  Dust mites absorb humidity from the atmosphere (ie, they do not drink) and feed on organic matter (including shed human and animal skin).  Dust mites are a microscopic type of insect that you cannot see with the naked eye.  High levels of dust mites have been detected from mattresses, pillows, carpets, upholstered furniture, bed covers, clothes, soft toys and any woven material.  The principal allergen of the dust mite is found in its feces.  A gram of dust may contain 1,000 mites and 250,000 fecal particles.  Mite antigen is easily measured in the air during house cleaning activities.  Dust mites do not bite and do not cause harm to humans, other than by triggering allergies/asthma.    Ways to decrease your exposure to dust mites in your home:  1. Encase mattresses, box springs and pillows with a mite-impermeable barrier or cover   2. Wash sheets, blankets and drapes weekly in hot water (130 F) with detergent and dry them  in a dryer on the hot setting.  3. Have the room cleaned frequently with a vacuum cleaner and a damp dust-mop.  For carpeting or rugs, vacuuming with a vacuum cleaner equipped with a high-efficiency particulate air (HEPA) filter.  The dust mite allergic individual should not be in a room which is being cleaned and should wait 1 hour after cleaning before going into the room. 4. Do  not sleep on upholstered furniture (eg, couches).   5. If possible removing carpeting, upholstered furniture and drapery from the home is ideal.  Horizontal blinds should be eliminated in the rooms where the person spends the most time (bedroom, study, television room).  Washable vinyl, roller-type shades are optimal. 6. Remove all non-washable stuffed toys from the bedroom.  Wash stuffed toys weekly like sheets and blankets above.   7. Reduce indoor humidity to less than 50%.  Inexpensive humidity monitors can be purchased at most hardware stores.  Do not use a humidifier as can make the problem worse and are not recommended.  Reducing Pollen Exposure  The American Academy of Allergy, Asthma and Immunology suggests the following steps to reduce your exposure to pollen during allergy seasons.    1. Do not hang sheets or clothing out to dry; pollen may collect on these items. 2. Do not mow lawns or spend time around freshly cut grass; mowing stirs up pollen. 3. Keep windows closed at night.  Keep car windows closed while driving. 4. Minimize morning activities outdoors, a time when pollen counts are usually at their highest. 5. Stay indoors as much as possible when pollen counts or humidity is high and on windy days when pollen tends to remain in the air longer. 6. Use air conditioning when possible.  Many air conditioners have filters that trap the pollen spores. 7. Use a HEPA room air filter to remove pollen form the indoor air you breathe.  Control of Mold Allergen   Mold and fungi can grow on a variety of surfaces provided certain temperature and moisture conditions exist.  Outdoor molds grow on plants, decaying vegetation and soil.  The major outdoor mold, Alternaria and Cladosporium, are found in very high numbers during hot and dry conditions.  Generally, a late Summer - Fall peak is seen for common outdoor fungal spores.  Rain will temporarily lower outdoor mold spore count, but counts rise  rapidly when the rainy period ends.  The most important indoor molds are Aspergillus and Penicillium.  Dark, humid and poorly ventilated basements are ideal sites for mold growth.  The next most common sites of mold growth are the bathroom and the kitchen.  Outdoor (Seasonal) Mold Control 1. Use air conditioning and keep windows closed 2. Avoid exposure to decaying vegetation. 3. Avoid leaf raking. 4. Avoid grain handling. 5. Consider wearing a face mask if working in moldy areas.    Indoor (Perennial) Mold Control   1. Maintain humidity below 50%. 2. Clean washable surfaces with 5% bleach solution. 3. Remove sources e.g. contaminated carpets.     Control of Dog or Cat Allergen  Avoidance is the best way to manage a dog or cat allergy. If you have a dog or cat and are allergic to dog or cats, consider removing the dog or cat from the home. If you have a dog or cat but don't want to find it a new home, or if your family wants a pet even though someone in the household is allergic, here are some strategies that may  help keep symptoms at bay:  1. Keep the pet out of your bedroom and restrict it to only a few rooms. Be advised that keeping the dog or cat in only one room will not limit the allergens to that room. 2. Don't pet, hug or kiss the dog or cat; if you do, wash your hands with soap and water. 3. High-efficiency particulate air (HEPA) cleaners run continuously in a bedroom or living room can reduce allergen levels over time. 4. Regular use of a high-efficiency vacuum cleaner or a central vacuum can reduce allergen levels. 5. Giving your dog or cat a bath at least once a week can reduce airborne allergen.

## 2019-06-30 ENCOUNTER — Encounter: Payer: Self-pay | Admitting: Allergy & Immunology

## 2019-06-30 DIAGNOSIS — K1379 Other lesions of oral mucosa: Secondary | ICD-10-CM

## 2019-06-30 DIAGNOSIS — J302 Other seasonal allergic rhinitis: Secondary | ICD-10-CM | POA: Insufficient documentation

## 2019-06-30 DIAGNOSIS — J3089 Other allergic rhinitis: Secondary | ICD-10-CM | POA: Insufficient documentation

## 2019-06-30 HISTORY — DX: Other lesions of oral mucosa: K13.79

## 2019-06-30 HISTORY — DX: Other seasonal allergic rhinitis: J30.2

## 2019-07-06 LAB — ALPHA-GAL PANEL
Alpha Gal IgE*: <0.1 kU/L (ref <0.10)
Beef (Bos spp) IgE: <0.1 kU/L (ref <0.35)
Class Interpretation: 0
Class Interpretation: 0
Class Interpretation: 0
Lamb/Mutton (Ovis spp) IgE: <0.1 kU/L (ref <0.35)
Pork (Sus spp) IgE: <0.1 kU/L (ref <0.35)

## 2019-07-06 LAB — C-REACTIVE PROTEIN: CRP: 1 mg/L (ref 0–10)

## 2019-07-06 LAB — CHRONIC URTICARIA: cu index: 1.9 (ref <10)

## 2019-07-06 LAB — SEDIMENTATION RATE: Sed Rate: 19 mm/hr (ref 0–40)

## 2019-07-06 LAB — TRYPTASE: Tryptase: 5.5 ug/L (ref 2.2–13.2)

## 2019-07-06 LAB — ANA W/REFLEX IF POSITIVE: Anti Nuclear Antibody (ANA): NEGATIVE

## 2019-07-13 MED FILL — ROSUVASTATIN CALCIUM 5 MG T: 5 | 30 days supply | Qty: 30 | Fill #4

## 2019-07-13 MED FILL — ESCITALOPRAM 10 MG TABLET: 10 | 30 days supply | Qty: 30 | Fill #3

## 2019-07-13 MED FILL — PROGESTERONE 100 MG CAPSULE: 100 | 30 days supply | Qty: 30 | Fill #9

## 2019-08-12 MED FILL — PROGESTERONE 100 MG CAPSULE: 100 | 30 days supply | Qty: 30 | Fill #10

## 2019-08-12 MED FILL — ROSUVASTATIN CALCIUM 5 MG T: 5 | 30 days supply | Qty: 30 | Fill #5

## 2019-08-12 MED FILL — buPROPion HCL ER (XL) 150 M: 150 | 30 days supply | Qty: 30 | Fill #0

## 2019-09-05 ENCOUNTER — Other Ambulatory Visit (HOSPITAL_COMMUNITY): Payer: Self-pay | Admitting: Obstetrics and Gynecology

## 2019-09-05 MED FILL — PROGESTERONE MICRONIZED 100: 100 | 30 days supply | Qty: 30 | Fill #0

## 2019-09-12 MED FILL — ROSUVASTATIN CALCIUM 5 MG T: 5 | 30 days supply | Qty: 30 | Fill #6

## 2019-09-13 MED FILL — buPROPion HCL ER (XL) 150 M: 150 | 30 days supply | Qty: 30 | Fill #0

## 2019-09-20 MED FILL — NITROFURANTOIN MONO-MCR 100: 100 | 30 days supply | Qty: 60 | Fill #0

## 2019-09-27 ENCOUNTER — Ambulatory Visit: Payer: PRIVATE HEALTH INSURANCE | Admitting: Allergy & Immunology

## 2019-10-10 ENCOUNTER — Other Ambulatory Visit (HOSPITAL_COMMUNITY): Payer: Self-pay | Admitting: Family Medicine

## 2019-10-21 MED FILL — ROSUVASTATIN CALCIUM 5 MG T: 5 | 30 days supply | Qty: 30 | Fill #7

## 2019-11-14 MED FILL — ROSUVASTATIN CALCIUM 5 MG T: 5 | 30 days supply | Qty: 30 | Fill #8

## 2019-11-14 MED FILL — buPROPion HCL ER (XL) 150 M: 150 | 30 days supply | Qty: 30 | Fill #1

## 2019-11-19 MED FILL — PROGESTERONE 100 MG CAPS: 100 | 30 days supply | Qty: 30 | Fill #2

## 2019-12-12 ENCOUNTER — Other Ambulatory Visit (HOSPITAL_COMMUNITY): Payer: Self-pay | Admitting: Family Medicine

## 2019-12-12 MED FILL — PROGESTERONE 100 MG CAPS: 100 | 30 days supply | Qty: 30 | Fill #3

## 2019-12-12 MED FILL — buPROPion HCL ER (XL) 150 M: 150 | 30 days supply | Qty: 30 | Fill #2

## 2019-12-12 MED FILL — ROSUVASTATIN CALCIUM 5 MG T: 5 | 30 days supply | Qty: 30 | Fill #0

## 2019-12-23 MED FILL — ALPRAZolam 0.5 MG TABS: 0.5 | 15 days supply | Qty: 30 | Fill #0

## 2019-12-26 NOTE — Progress Notes (Signed)
Cardiology Office Note    Date:  12/27/2019   ID:  Kristin Bradford, DOB 1956-05-29, MRN [id number]  PCP:  Mayra Neer, MD  Cardiologist: Kristin primary care provider on file. EPS: None  Chief Complaint  Patient presents with  . Palpitations    History of Present Illness:  Kristin Bradford is a 64 y.o. female who had a telemedicine visit with Dr. Irish Lack as a new patient 12/13/2018.  She had 2 documented recurrences of PAF following surgery.  She can tell when she is out of rhythm and denied breakthroughs.  CHA2DS2-VASc score equals 1.  She was seen by Dr. Lovena Le in the past and declined flecainide.  Metoprolol 25 mg as needed was given for tachycardia. And treated with aspirin.  Also has PACs and PVCs.  Has autonomic dysfunction with hypotension and recommended to increase her salt and fluid intake.  She last saw Dr. Irish Lack 12/2018 for surgical clearance.  He recommended pretreating her with metoprolol 12.5 mg twice daily the day before day of and a few days after surgery if blood pressure allows.  Kristin further cardiac testing recommended.  Patient says since last year she has had an increase in palpitations. The last 2 weeks it's become more frequent and associated with shortness of breath. Kristin chest pain or dizziness. Kristin jaw pain or vomiting that she gets when she's in afib. Walks 3 miles/day at work and walks 1 mile 3 days/week but hasn't done in the past 3 weeks because of her breathing. Doesn't feel like she's under a lot of stress.Doesn't drink enough water because of interstitial cystitis.    Past Medical History:  Diagnosis Date  . A-fib ([place])   . 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 Acquired deafness of left ear    S/P  RESECTION ACOUSTIC NEUROMA  2004  . Acquired facial asymmetry    post surgery  . Angio-edema   . Atrial fibrillation ([place]) 05/18/2015  . Dysrhythmia    a fib after surgery  . Fibroids 03/30/2019  . Gastroenteritis 05/21/2015  . GERD (gastroesophageal reflux  disease)   . Gestational diabetes mellitus    gestastional  . Headache    "weekly" (05/22/2015)  . 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 Hyperlipidemia   . Hypothyroidism    hx of Kristin meds now  . IC (interstitial cystitis)   . Interstitial cystitis 05/18/2015  . Migraine    "q several months" (05/22/2015)  . Neuromuscular disorder (HCC)    tremor to right side when upset   . PONV (postoperative nausea and vomiting)    atrial fib  . Recurrent oral ulcers 06/30/2019  . S/P myomectomy 03/30/2019  . Seasonal and perennial allergic rhinitis 06/30/2019  . 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 Syncope   . Unilateral primary osteoarthritis, left hip 10/06/2016  . Urticaria     Past Surgical History:  Procedure Laterality Date  . ACOUSTIC NEUROMA RESECTION Left 2004  . BRAIN SURGERY    . CATARACT EXTRACTION W/ INTRAOCULAR LENS IMPLANT Bilateral 01/2014  . COLONOSCOPY  11/2014  . CYSTO WITH HYDRODISTENSION N/A 05/18/2015   Procedure: CYSTOSCOPY/HYDRODISTENSION;  Surgeon: Carolan Clines, MD;  Location[facility];  Service: Urology;  Laterality: N/A;  . CYSTO WITH HYDRODISTENSION N/A 03/30/2019   Procedure: CYSTOSCOPY/HYDRODISTENSION;  Surgeon: Alexis Frock, MD;  Location: [facility];  Service: Urology;  Laterality: N/A;  . CYSTO/  HYDRODISTENTION/  INSTILLATION THERAPY  x4  last one 05-16-2010   since 1990's  . ESOPHAGOGASTRODUODENOSCOPY  11/2014  . HEMANGIOMA EXCISION Left 2005    FACIAL ANGIOMA REMOVAL / MASTOIDECTOMY  . HEMORRHOID BANDING  05/2015  . MASTOIDECTOMY Left 2005   LEFT FACIAL ANGIOMA REMOVAL /  LEFT MASTOIDECTOMY [Other]  . MYOMECTOMY N/A 03/30/2019   Procedure: ABDOMINAL MYOMECTOMY;  Surgeon: Dian Queen, MD;  Location: [facility];  Service: Gynecology;  Laterality: N/A;  . TOTAL HIP ARTHROPLASTY Left 10/24/2016   Procedure: LEFT TOTAL HIP ARTHROPLASTY ANTERIOR APPROACH;  Surgeon: Mcarthur Rossetti, MD;  Location: [facility];  Service: Orthopedics;  Laterality: Left;  . [place]   "all 4"    Current Medications: Current Meds  Medication Sig  . ALPRAZolam (XANAX) 0.5 MG tablet Take 0.25 mg by mouth daily as needed for anxiety.   . AZO-CRANBERRY PO Take 1 tablet by mouth daily.  Kristin Bradford buPROPion (WELLBUTRIN XL) 150 MG 24 hr tablet Take 150 mg by mouth every morning.  . COLLAGEN PO Take 0.5 [place] by mouth daily.  . meloxicam (MOBIC) 7.5 MG tablet Take 7.5 mg by mouth daily.  . metoprolol tartrate (LOPRESSOR) 25 MG tablet Take 0.5 tablets (12.5 mg total) by mouth 2 (two) times daily as needed (fast or irregular heart rate).  . Multiple Vitamin (MULTIVITAMIN) tablet Take 1 tablet by mouth daily.  . polyethylene glycol (MIRALAX / GLYCOLAX) packet Take 17 g by mouth daily.  . progesterone (PROMETRIUM) 100 MG capsule Take 100 mg by mouth at bedtime.  . rosuvastatin (CRESTOR) 5 MG tablet Take 5 mg by mouth daily.     Allergies:   Fentanyl, Betadine [povidone iodine], Codeine, Other, Penicillins, Sulfa antibiotics, and Tetanus toxoids   Social History   Socioeconomic History  . Marital status: Legally Separated    Spouse name: Not on file  . Number of children: Not on file  . Years of education: Not on file  . Highest education level: Not on file  Occupational History  . Not on file  Tobacco Use  . Smoking status: Never Smoker  . Smokeless tobacco: Never Used  Vaping Use  . Vaping Use: Never used  Substance and Sexual Activity  . Alcohol use: Yes    Alcohol/week: 1.0 standard drink    Types: 1 Glasses of wine per week    Comment: rare  . Drug use: Kristin  . Sexual activity: Not Currently    Birth control/protection: None  Other Topics Concern  . Not on file  Social History Narrative  . Not on file   Social Determinants of Health   Financial Resource Strain:   . Difficulty of Paying Living Expenses:   Food Insecurity:   . Worried About Paediatric nurse in the Last Year:   . Arboriculturist in the Last Year:   Transportation Needs:   . Film/video editor (Medical):   Kristin Bradford Lack of Transportation (Non-Medical):   Physical Activity:   . Days of Exercise per Week:   . Minutes of Exercise per Session:   Stress:   . Feeling of Stress :   Social Connections:   . Frequency of Communication with Friends and Family:   . Frequency of Social Gatherings with Friends and Family:   . Attends Religious Services:   . Active Member of Clubs or Organizations:   . Attends Archivist Meetings:   Kristin Bradford Marital Status:      Family History:  The patient's  family history is not on file. She was adopted.   ROS:   Please see the history of present illness.    ROS All other systems reviewed and are negative.   PHYSICAL EXAM:   VS:  BP 114/68   Pulse 68   Ht 5\' 10"  (1.778 m)   Wt 197 lb 3.2 oz (89.4 kg)   SpO2 99%   BMI 28.30 kg/m   Physical Exam  GEN: Well nourished, well developed, in Kristin acute distress  Neck: Kristin JVD, carotid bruits, or masses Cardiac:RRR; Kristin murmurs, rubs, or gallops  Respiratory:  clear to auscultation bilaterally, normal work of breathing GI: soft, nontender, nondistended, + BS Ext: without cyanosis, clubbing, or edema, Good distal pulses bilaterally Neuro:  Alert and Oriented x 3 Psych: euthymic mood, full affect  Wt Readings from Last 3 Encounters:  12/27/19 197 lb 3.2 oz (89.4 kg)  06/28/19 204 lb (92.5 kg)  03/30/19 210 lb 15.7 oz (95.7 kg)      Studies/Labs Reviewed:   EKG:  EKG is ordered today.  The ekg ordered today demonstrates NSR Kristin change.  Recent Labs: 03/31/2019: Hemoglobin 11.2; Platelets 229   Lipid Panel Kristin results found for: CHOL, TRIG, HDL, CHOLHDL, VLDL, LDLCALC, LDLDIRECT  Additional studies/ records that were reviewed today include:  Echo 09/2016 Study Conclusions   - Left ventricle: The cavity size was normal. Systolic function was    vigorous. The estimated  ejection fraction was in the range of 65%    to 70%. Wall motion was normal; there were Kristin regional wall    motion abnormalities.  - Aortic valve: There was Kristin regurgitation.  - Aortic root: The aortic root was normal in size.  - Mitral valve: There was Kristin regurgitation.  - Left atrium: The atrium was normal in size.  - Right ventricle: The cavity size was normal. Wall thickness was    normal. Systolic function was normal.  - Right atrium: The atrium was normal in size.  - Tricuspid valve: There was trivial regurgitation.  - Pulmonary arteries: Systolic pressure was within the normal    range.  - Inferior vena cava: The vessel was normal in size.  - Pericardium, extracardiac: There was Kristin pericardial effusion.   Impressions:   - Normal study.    Monitor 2017 7 Day Event Monitor   Quality: Fair.  Baseline artifact.   Several episodes of PVCs and atrial tachycardia.   One episode of syncope was reported, at which time sinus rhythm was noted.      Tiffany C. Oval Linsey, MD, [facility] 11/16/2015 3:08 PM  NST 2017 Study Highlights   The left ventricular ejection fraction is normal (55-65%).  Nuclear stress EF: 64%.  There was Kristin ST segment deviation noted during stress.  This is a low risk study.  The study is normal.  Breast attenuation artifact was noted both at rest and with stress.      ASSESSMENT:    1. Paroxysmal atrial fibrillation (HCC)   2. PAC (premature atrial contraction)   3. PVC (premature ventricular contraction)   4. Hypotension, unspecified hypotension type      PLAN:  In order of problems listed above: PAF on 2 occasions after surgery CHA2DS2-VASc equals 1  on aspirin and as needed metoprolol.  Has declined flecainide in the past. Now with increase in palpitations worse over the past 2 weeks. Feels like she can't get a deep breath. Different from her Afib symptoms. EKG's reviewed from her apple watch  HR 120 NSR but 2 had a lot of artifact. Labs  including TSH normal in March. Will place 2 week monitor.  PVCs/PACs-takes metoprolol prn but makes her feel to tired to take regularly.  Hypotension/autonomic dysfunction recommended that she stay hydrated but struggles with this because of interstitial cystitis.     Medication Adjustments/Labs and Tests Ordered: Current medicines are reviewed at length with the patient today.  Concerns regarding medicines are outlined above.  Medication changes, Labs and Tests ordered today are listed in the Patient Instructions below. Patient Instructions  Medication Instructions:  Your physician recommends that you continue on your current medications as directed. Please refer to the Current Medication list given to you today.  *If you need a refill on your cardiac medications before your next appointment, please call your pharmacy*   Lab Work: None If you have labs (blood work) drawn today and your tests are completely normal, you will receive your results only by: Kristin Bradford MyChart Message (if you have MyChart) OR . A paper copy in the mail If you have any lab test that is abnormal or we need to change your treatment, we will call you to review the results.   Testing/Procedures: Your physician has recommended that you wear a zio monitor. These monitors are medical devices that record the heart's electrical activity. Doctors most often use these monitors to diagnose arrhythmias. Arrhythmias are problems with the speed or rhythm of the heartbeat. The monitor is a small, portable device. You can wear one while you do your normal daily activities. This is usually used to diagnose what is causing palpitations/syncope (passing out).     Follow-Up: At [facility], you and your health needs are our priority.  As part of our continuing mission to provide you with exceptional heart care, we have created designated Provider Care Teams.  These Care Teams include your primary Cardiologist (physician) and  Advanced Practice Providers (APPs -  Physician Assistants and Nurse Practitioners) who all work together to provide you with the care you need, when you need it.  We recommend signing up for the patient portal called "MyChart".  Sign up information is provided on this After Visit Summary.  MyChart is used to connect with patients for Virtual Visits (Telemedicine).  Patients are able to view lab/test results, encounter notes, upcoming appointments, etc.  Non-urgent messages can be sent to your provider as well.   To learn more about what you can do with MyChart, go to [email or web address].    Your next appointment:   02/03/2020  The format for your next appointment:   In Person  Provider:   Casandra Doffing, MD   Other Instructions  Bryn Gulling- Long Term Monitor Instructions   Your physician has requested you wear your ZIO patch monitor___14____days.   This is a single patch monitor.  [employer] supplies one patch monitor per enrollment.  Additional stickers are not available.   Please do not apply patch if you will be having a Nuclear Stress Test, Echocardiogram, Cardiac CT, MRI, or Chest Xray during the time frame you would be wearing the monitor. The patch cannot be worn during these tests.  You cannot remove and re-apply the ZIO XT patch monitor.   Your ZIO patch monitor will be sent USPS Priority mail from [facility] directly to your home address. The monitor may also be mailed to a PO BOX if home delivery is not available.   It may take 3-5 days to receive your monitor after you have  been enrolled.   Once you have received you monitor, please review enclosed instructions.  Your monitor has already been registered assigning a specific monitor serial # to you.   Applying the monitor   Shave hair from upper left chest.   Hold abrader disc by orange tab.  Rub abrader in 40 strokes over left upper chest as indicated in your monitor instructions.   Clean area with 4 enclosed alcohol  pads .  Use all pads to assure are is cleaned thoroughly.  Let dry.   Apply patch as indicated in monitor instructions.  Patch will be place under collarbone on left side of chest with arrow pointing upward.   Rub patch adhesive wings for 2 minutes.Remove white label marked "1".  Remove white label marked "2".  Rub patch adhesive wings for 2 additional minutes.   While looking in a mirror, press and release button in center of patch.  A small green light will flash 3-4 times .  This will be your only indicator the monitor has been turned on.     Do not shower for the first 24 hours.  You may shower after the first 24 hours.   Press button if you feel a symptom. You will hear a small click.  Record Date, Time and Symptom in the Patient Log Book.   When you are ready to remove patch, follow instructions on last 2 pages of Patient Log Book.  Stick patch monitor onto last page of Patient Log Book.   Place Patient Log Book in [place] box.  Use locking tab on box and tape box closed securely.  The [employer] and [employer] has [employer] on it.  Please place in mailbox as soon as possible.  Your physician should have your test results approximately 7 days after the monitor has been mailed back to [facility]   Call [place] at [phone] if you have questions regarding your ZIO XT patch monitor.  Call them immediately if you see an orange light blinking on your monitor.   If your monitor falls off in less than 4 days contact our Monitor department at [phone].  If your monitor becomes loose or falls off after 4 days call [employer] at [phone] for suggestions on securing your monitor.      Sumner Boast, PA-C  12/27/2019 1:50 PM    [facility] [address], [place], [place]  [address] Phone: [phone]; Fax: [phone]

## 2019-12-27 ENCOUNTER — Other Ambulatory Visit: Payer: Self-pay

## 2019-12-27 ENCOUNTER — Encounter: Payer: Self-pay | Admitting: Physician Assistant

## 2019-12-27 ENCOUNTER — Ambulatory Visit: Payer: PRIVATE HEALTH INSURANCE | Admitting: Physician Assistant

## 2019-12-27 ENCOUNTER — Encounter (INDEPENDENT_AMBULATORY_CARE_PROVIDER_SITE_OTHER): Payer: Self-pay

## 2019-12-27 ENCOUNTER — Encounter: Payer: Self-pay | Admitting: *Deleted

## 2019-12-27 VITALS — BP 114/68 | HR 68 | Ht 70.0 in | Wt 197.2 lb

## 2019-12-27 DIAGNOSIS — I959 Hypotension, unspecified: Secondary | ICD-10-CM

## 2019-12-27 DIAGNOSIS — I491 Atrial premature depolarization: Secondary | ICD-10-CM

## 2019-12-27 DIAGNOSIS — I493 Ventricular premature depolarization: Secondary | ICD-10-CM | POA: Diagnosis not present

## 2019-12-27 DIAGNOSIS — I480 Paroxysmal atrial fibrillation: Secondary | ICD-10-CM

## 2019-12-27 NOTE — Patient Instructions (Addendum)
Medication Instructions:  Your physician recommends that you continue on your current medications as directed. Please refer to the Current Medication list given to you today.  *If you need a refill on your cardiac medications before your next appointment, please call your pharmacy*   Lab Work: None If you have labs (blood work) drawn today and your tests are completely normal, you will receive your results only by: Marland Kitchen MyChart Message (if you have MyChart) OR . A paper copy in the mail If you have any lab test that is abnormal or we need to change your treatment, we will call you to review the results.   Testing/Procedures: Your physician has recommended that you wear a zio monitor. These monitors are medical devices that record the heart's electrical activity. Doctors most often use these monitors to diagnose arrhythmias. Arrhythmias are problems with the speed or rhythm of the heartbeat. The monitor is a small, portable device. You can wear one while you do your normal daily activities. This is usually used to diagnose what is causing palpitations/syncope (passing out).     Follow-Up: At [facility], you and your health needs are our priority.  As part of our continuing mission to provide you with exceptional heart care, we have created designated Provider Care Teams.  These Care Teams include your primary Cardiologist (physician) and Advanced Practice Providers (APPs -  Physician Assistants and Nurse Practitioners) who all work together to provide you with the care you need, when you need it.  We recommend signing up for the patient portal called "MyChart".  Sign up information is provided on this After Visit Summary.  MyChart is used to connect with patients for Virtual Visits (Telemedicine).  Patients are able to view lab/test results, encounter notes, upcoming appointments, etc.  Non-urgent messages can be sent to your provider as well.   To learn more about what you can do with  MyChart, go to [email or web address].    Your next appointment:   02/03/2020  The format for your next appointment:   In Person  Provider:   Casandra Doffing, MD   Other Instructions  Bryn Gulling- Long Term Monitor Instructions   Your physician has requested you wear your ZIO patch monitor___14____days.   This is a single patch monitor.  [employer] supplies one patch monitor per enrollment.  Additional stickers are not available.   Please do not apply patch if you will be having a Nuclear Stress Test, Echocardiogram, Cardiac CT, MRI, or Chest Xray during the time frame you would be wearing the monitor. The patch cannot be worn during these tests.  You cannot remove and re-apply the ZIO XT patch monitor.   Your ZIO patch monitor will be sent USPS Priority mail from [facility] directly to your home address. The monitor may also be mailed to a PO BOX if home delivery is not available.   It may take 3-5 days to receive your monitor after you have been enrolled.   Once you have received you monitor, please review enclosed instructions.  Your monitor has already been registered assigning a specific monitor serial # to you.   Applying the monitor   Shave hair from upper left chest.   Hold abrader disc by orange tab.  Rub abrader in 40 strokes over left upper chest as indicated in your monitor instructions.   Clean area with 4 enclosed alcohol pads .  Use all pads to assure are is cleaned thoroughly.  Let dry.   Apply patch as indicated  in monitor instructions.  Patch will be place under collarbone on left side of chest with arrow pointing upward.   Rub patch adhesive wings for 2 minutes.Remove white label marked "1".  Remove white label marked "2".  Rub patch adhesive wings for 2 additional minutes.   While looking in a mirror, press and release button in center of patch.  A small green light will flash 3-4 times .  This will be your only indicator the monitor has been turned on.      Do not shower for the first 24 hours.  You may shower after the first 24 hours.   Press button if you feel a symptom. You will hear a small click.  Record Date, Time and Symptom in the Patient Log Book.   When you are ready to remove patch, follow instructions on last 2 pages of Patient Log Book.  Stick patch monitor onto last page of Patient Log Book.   Place Patient Log Book in [place] box.  Use locking tab on box and tape box closed securely.  The [employer] has [employer] on it.  Please place in mailbox as soon as possible.  Your physician should have your test results approximately 7 days after the monitor has been mailed back to [facility].   Call [place] at [phone] if you have questions regarding your ZIO XT patch monitor.  Call them immediately if you see an orange light blinking on your monitor.   If your monitor falls off in less than 4 days contact our Monitor department at [phone].  If your monitor becomes loose or falls off after 4 days call [employer] at [phone] for suggestions on securing your monitor.

## 2019-12-27 NOTE — Progress Notes (Signed)
Patient ID: Kristin Bradford, female   DOB: [birth year], 64 y.o.   MRN: [id number] Patient enrolled for [employer] to ship a 14 day ZIO XT long term holter monitor to her home.

## 2019-12-29 MED FILL — CLINDAMYCIN HCL 300 MG CAPS: 300 | 8 days supply | Qty: 30 | Fill #0

## 2019-12-30 ENCOUNTER — Telehealth: Payer: Self-pay | Admitting: Interventional Cardiology

## 2019-12-30 NOTE — Telephone Encounter (Signed)
Left detailed message (DPR on file) informing pt that OV w/ Dayna Dunn is ok to keep.  Aware information from that visit will be sent to Dr. Irish Lack for review. Advised to call back if she had further questions on this matter.

## 2019-12-30 NOTE — Telephone Encounter (Signed)
Pt called back returning [employer]

## 2019-12-30 NOTE — Telephone Encounter (Signed)
New Message  Pt is calling and says she had to reschedule her appt with Dr Irish Lack in September due to working. She rescheduled with Melina Copa in September and wants to make sure it is okay she see's the PA or if she needs to see Dr Irish Lack   Please advise

## 2020-01-01 ENCOUNTER — Other Ambulatory Visit (INDEPENDENT_AMBULATORY_CARE_PROVIDER_SITE_OTHER): Payer: PRIVATE HEALTH INSURANCE

## 2020-01-01 DIAGNOSIS — I480 Paroxysmal atrial fibrillation: Secondary | ICD-10-CM | POA: Diagnosis not present

## 2020-01-01 DIAGNOSIS — I493 Ventricular premature depolarization: Secondary | ICD-10-CM | POA: Diagnosis not present

## 2020-01-13 MED FILL — ROSUVASTATIN CALCIUM 5 MG T: 5 | 30 days supply | Qty: 30 | Fill #1

## 2020-01-13 MED FILL — PROGESTERONE 100 MG CAPS: 100 | 30 days supply | Qty: 30 | Fill #4

## 2020-01-13 MED FILL — buPROPion HCL ER (XL) 150 M: 150 | 30 days supply | Qty: 30 | Fill #3

## 2020-01-27 ENCOUNTER — Telehealth: Payer: Self-pay

## 2020-01-27 DIAGNOSIS — I471 Supraventricular tachycardia: Secondary | ICD-10-CM

## 2020-01-27 DIAGNOSIS — I491 Atrial premature depolarization: Secondary | ICD-10-CM

## 2020-01-27 NOTE — Telephone Encounter (Signed)
Patient aware of results per Ermalinda Barrios PA. Patient does not feel like she needs to take metoprolol 12.5 mg BID right now, but is still going to take it as needed. Will place order for echo. Patient has follow up with Melina Copa PA next month. Will try to get echo scheduled before appointment.

## 2020-01-27 NOTE — Telephone Encounter (Signed)
-----   Message from Imogene Burn, PA-C sent at 01/27/2020 10:39 AM EDT ----- Holter shows some skipping with PAC's and PVC's but also several episodes of SVT lasting up to 28 secs. She has history of hypotension. She could try low dose metoprolol 12. 5 mg bid. If she thinks she won't tolerate this she needs to see Dr. Lovena Le back. She also needs an updated echo.thanks

## 2020-02-03 ENCOUNTER — Ambulatory Visit: Payer: PRIVATE HEALTH INSURANCE | Admitting: Interventional Cardiology

## 2020-02-09 ENCOUNTER — Encounter (INDEPENDENT_AMBULATORY_CARE_PROVIDER_SITE_OTHER): Payer: Self-pay

## 2020-02-09 ENCOUNTER — Ambulatory Visit (HOSPITAL_COMMUNITY): Payer: PRIVATE HEALTH INSURANCE | Attending: Cardiology

## 2020-02-09 ENCOUNTER — Other Ambulatory Visit: Payer: Self-pay

## 2020-02-09 DIAGNOSIS — I491 Atrial premature depolarization: Secondary | ICD-10-CM | POA: Insufficient documentation

## 2020-02-09 DIAGNOSIS — I471 Supraventricular tachycardia: Secondary | ICD-10-CM | POA: Diagnosis not present

## 2020-02-09 LAB — ECHOCARDIOGRAM COMPLETE
Area-P 1/2: 3.33 cm2
S' Lateral: 2.2 cm

## 2020-02-09 MED FILL — ROSUVASTATIN CALCIUM 5 MG T: 5 | 30 days supply | Qty: 30 | Fill #2

## 2020-02-09 MED FILL — PROGESTERONE 100 MG CAPS: 100 | 30 days supply | Qty: 30 | Fill #5

## 2020-02-09 MED FILL — buPROPion HCL ER (XL) 150 M: 150 | 30 days supply | Qty: 30 | Fill #4

## 2020-02-15 ENCOUNTER — Encounter: Payer: Self-pay | Admitting: Physician Assistant

## 2020-02-15 NOTE — Progress Notes (Deleted)
Cardiology Office Note    Date:  02/15/2020   ID:  Kristin Bradford, DOB [birth year], MRN [id number]  PCP:  Mayra Neer, MD  Cardiologist:  Larae Grooms, MD  Electrophysiologist:  Cristopher Peru, MD   Chief Complaint: f/u to discuss palpitations and event monitor  History of Present Illness:   Kristin Bradford is a 64 y.o. 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 She had prior normal stress testing in 2017. She has prior history of atrial fib following surgery. She could tell when she was out of rhythm. She has not required anticoagulation due to [facility] of 1 and limited recurrences. She waas seen by Dr. Lovena Le in the past and was offered flecainide but declined. She recently presented back to the clinic with increased palpitations. Recent echo 02/09/20 with normal LVEF 55-60%, normal PASP, no significant findings otherwise. Event monitor showed NSR with rare PACs, PVCs, short runs of PACs and PVCs, and several episodes of SVT longest lasting 28 seconds, with symptoms correlating to ectopy.  bmet, mg, tsh     PSVT PACs, PVCs Paroxysmal atrial fibrillation History of hypothyroidism  Labwork independently reviewed: 03/2019 Hgb 11.2 2018 K 3.8, Cr 0.78, Mg and TSH wnl   Past Medical History:  Diagnosis Date  . Acoustic neuroma ([place]) 05/18/2015  . Acquired deafness of left ear    S/P  RESECTION ACOUSTIC NEUROMA  2004  . Acquired facial asymmetry    post surgery  . Angio-edema   . Atrial fibrillation ([place]) 05/18/2015  . Fibroids 03/30/2019  . GERD (gastroesophageal reflux disease)   . Gestational diabetes mellitus    gestastional  . Hyperlipidemia   . Hypothyroidism    hx of no meds now  . IC (interstitial cystitis)   . Migraine    "q several months" (05/22/2015)  . Neuromuscular disorder (HCC)    tremor to right side  when upset   . PONV (postoperative nausea and vomiting)    atrial fib  . Premature atrial contractions   . PVC's (premature ventricular contractions)   . Recurrent oral ulcers 06/30/2019  . S/P myomectomy 03/30/2019  . Seasonal and perennial allergic rhinitis 06/30/2019  . SVT (supraventricular tachycardia) ([place])   . Syncope   . Urticaria     Past Surgical History:  Procedure Laterality Date  . ACOUSTIC NEUROMA RESECTION Left 2004  . BRAIN SURGERY    . CATARACT EXTRACTION W/ INTRAOCULAR LENS IMPLANT Bilateral 01/2014  . COLONOSCOPY  11/2014  . CYSTO WITH HYDRODISTENSION N/A 05/18/2015   Procedure: CYSTOSCOPY/HYDRODISTENSION;  Surgeon: Carolan Clines, MD;  [facility];  Service: Urology;  Laterality: N/A;  . CYSTO WITH HYDRODISTENSION N/A 03/30/2019   Procedure: CYSTOSCOPY/HYDRODISTENSION;  Surgeon: Alexis Frock, MD;  Location: [facility];  Service: Urology;  Laterality: N/A;  . CYSTO/  HYDRODISTENTION/  INSTILLATION THERAPY  x4  last one 05-16-2010   since 1990's  . ESOPHAGOGASTRODUODENOSCOPY  11/2014  . HEMANGIOMA EXCISION Left 2005    FACIAL ANGIOMA REMOVAL / MASTOIDECTOMY  . HEMORRHOID BANDING  05/2015  . MASTOIDECTOMY Left 2005   LEFT FACIAL ANGIOMA REMOVAL /  LEFT MASTOIDECTOMY [Other]  . MYOMECTOMY N/A 03/30/2019   Procedure: ABDOMINAL MYOMECTOMY;  Surgeon: Dian Queen, MD;  Location[facility];  Service: Gynecology;  Laterality: N/A;  . TOTAL HIP ARTHROPLASTY Left 10/24/2016   Procedure: LEFT TOTAL HIP ARTHROPLASTY ANTERIOR APPROACH;  Surgeon: Mcarthur Rossetti, MD;  Location: [facility];  Service: Orthopedics;  Laterality: Left;  . [place]   "all 4"    Current Medications: No outpatient medications have been marked as taking for the 02/17/20 encounter (Appointment) with Charlie Pitter, PA-C.   ***   Allergies:   Fentanyl, Betadine [povidone iodine], Codeine, Other, Penicillins, Sulfa antibiotics, and Tetanus toxoids    Social History   Socioeconomic History  . Marital status: Legally Separated    Spouse name: Not on file  . Number of children: Not on file  . Years of education: Not on file  . Highest education level: Not on file  Occupational History  . Not on file  Tobacco Use  . Smoking status: Never Smoker  . Smokeless tobacco: Never Used  Vaping Use  . Vaping Use: Never used  Substance and Sexual Activity  . Alcohol use: Yes    Alcohol/week: 1.0 standard drink    Types: 1 Glasses of wine per week    Comment: rare  . Drug use: No  . Sexual activity: Not Currently    Birth control/protection: None  Other Topics Concern  . Not on file  Social History Narrative  . Not on file   Social Determinants of Health   Financial Resource Strain:   . Difficulty of Paying Living Expenses: Not on file  Food Insecurity:   . Worried About Charity fundraiser in the Last Year: Not on file  . Ran Out of Food in the Last Year: Not on file  Transportation Needs:   . Lack of Transportation (Medical): Not on file  . Lack of Transportation (Non-Medical): Not on file  Physical Activity:   . Days of Exercise per Week: Not on file  . Minutes of Exercise per Session: Not on file  Stress:   . Feeling of Stress : Not on file  Social Connections:   . Frequency of Communication with Friends and Family: Not on file  . Frequency of Social Gatherings with Friends and Family: Not on file  . Attends Religious Services: Not on file  . Active Member of Clubs or Organizations: Not on file  . Attends Archivist Meetings: Not on file  . Marital Status: Not on file     Family History:  The patient's ***family history is not on file. She was adopted.  ROS:   Please see the history of present illness. Otherwise, review of systems is positive for ***.  All other systems are reviewed and otherwise negative.    EKGs/Labs/Other Studies Reviewed:    Studies reviewed are outlined and summarized above.  Reports included below if pertinent.  2D echo 02/09/20 1. Left ventricular ejection fraction, by estimation, is 55 to 60%. The  left ventricle has normal function. The left ventricle has no regional  wall motion abnormalities. Left ventricular diastolic parameters are  indeterminate.  2. Right ventricular systolic function is normal. The right ventricular  size is normal. There is normal pulmonary artery systolic pressure. The  estimated right ventricular systolic pressure is 27.0 mmHg.  3. The mitral valve is normal in structure. Trivial mitral valve  regurgitation. No evidence of mitral stenosis.  4. The aortic valve is tricuspid. Aortic valve regurgitation is not  visualized. No aortic stenosis is present.  5. The inferior vena cava is normal in size with greater than 50%  respiratory variability, suggesting right atrial pressure of 3 mmHg.   NST 2017  The left  ventricular ejection fraction is normal (55-65%).  Nuclear stress EF: 64%.  There was no ST segment deviation noted during stress.  This is a low risk study.  The study is normal.  Breast attenuation artifact was noted both at rest and with stress.     EKG:  EKG is ordered today, personally reviewed, demonstrating ***  Recent Labs: 03/31/2019: Hemoglobin 11.2; Platelets 229  Recent Lipid Panel No results found for: CHOL, TRIG, HDL, CHOLHDL, VLDL, LDLCALC, LDLDIRECT  PHYSICAL EXAM:    VS:  There were no vitals taken for this visit.  BMI: There is no height or weight on file to calculate BMI.  GEN: Well nourished, well developed, in no acute distress HEENT: normocephalic, atraumatic Neck: no JVD, carotid bruits, or masses Cardiac: ***RRR; no murmurs, rubs, or gallops, no edema  Respiratory:  clear to auscultation bilaterally, normal work of breathing GI: soft, nontender, nondistended, + BS MS: no deformity or atrophy Skin: warm and dry, no rash Neuro:  Alert and Oriented x 3, Strength and sensation are  intact, follows commands Psych: euthymic mood, full affect  Wt Readings from Last 3 Encounters:  12/27/19 197 lb 3.2 oz (89.4 kg)  06/28/19 204 lb (92.5 kg)  03/30/19 210 lb 15.7 oz (95.7 kg)     ASSESSMENT & PLAN:   1. ***  Disposition: F/u with ***   Medication Adjustments/Labs and Tests Ordered: Current medicines are reviewed at length with the patient today.  Concerns regarding medicines are outlined above. Medication changes, Labs and Tests ordered today are summarized above and listed in the Patient Instructions accessible in Encounters.   Signed, Charlie Pitter, PA-C  02/15/2020 12:45 PM    [facility] [place], [place], [place]  [address] Phone: [phone]; Fax: [phone]

## 2020-02-17 ENCOUNTER — Ambulatory Visit: Payer: PRIVATE HEALTH INSURANCE | Admitting: Physician Assistant

## 2020-03-12 MED FILL — buPROPion HCL ER (XL) 150 M: 150 | 30 days supply | Qty: 30 | Fill #5

## 2020-03-12 MED FILL — ROSUVASTATIN CALCIUM 5 MG T: 5 | 30 days supply | Qty: 30 | Fill #3

## 2020-03-12 MED FILL — PROGESTERONE 100 MG CAPS: 100 | 30 days supply | Qty: 30 | Fill #6

## 2020-03-27 NOTE — Progress Notes (Signed)
Cardiology Office Note   Date:  03/28/2020   ID:  Kristin Bradford, DOB August 19, 1955, MRN [id number]  PCP:  Mayra Neer, MD    No chief complaint on file.  PAF  Wt Readings from Last 3 Encounters:  03/28/20 195 lb (88.5 kg)  12/27/19 197 lb 3.2 oz (89.4 kg)  06/28/19 204 lb (92.5 kg)       History of Present Illness: Kristin Bradford is a 64 y.o. female   with a h/o PAF.  Prior records from 2018 noted: "FMB:WGYK 2 documented occurrences, both immediately following surgery. She can tell when she is out of rhythm and She denies any recurrent breakthrough symptoms since her last surgery. RRR on exam today. HR and BP both stable. CHA2DS2 VASc scoreis 1, thus we will continue ASA only. She was seen in past by Dr. Lovena Le and declined Flecainide. We discussed PRN metoprolol, in the event that she experiences breakthrough symptoms/ increased HR. She is in agreement with this. Will order 25 mg tablets of metoprolol PRN for afib/ tachycardia. She was instructed to avoid triggers (caffiene and ETOH). F/u in 6 months with Dr. Irish Lack."  [facility] was seen by Dr. Lovena Le in the past and noted to have:  "PAF - she has had no recurrence. She had been on Eliquis. Her CHADSVASC score is 1. I have recommended stopping Eliquis. Low dose ASA would be an option. 2. Palpitations - she is symptomatic with he PAC's, PVC's and PAF. I have offered her flecainide. She is reflecting on this.  3. Hypotension - she appears to have some autonomic dysfunction by her history. I have recommended she increase her salt and fluid intake as tolerated.    She had GYN surgery with Dr. Helane Rima in 03/2019.  No arrhythmia at that time.   She checks her O2 sats regularly which are always normal.    She has more palpitations in the morning.  They occur rarely.  She can take a prn metoprolol with relief.    9/21 echo: "Left ventricular ejection fraction, by estimation, is 55 to 60%. The  left ventricle has normal function.  The left ventricle has no regional  wall motion abnormalities. Left ventricular diastolic parameters are  indeterminate.  2. Right ventricular systolic function is normal. The right ventricular  size is normal. There is normal pulmonary artery systolic pressure. The  estimated right ventricular systolic pressure is 59.9 mmHg.  3. The mitral valve is normal in structure. Trivial mitral valve  regurgitation. No evidence of mitral stenosis.  4. The aortic valve is tricuspid. Aortic valve regurgitation is not  visualized. No aortic stenosis is present.  5. The inferior vena cava is normal in size with greater than 50%  respiratory variability, suggesting right atrial pressure of 3 mmHg."  Monitor showed NSR, PACs, PVCs, short runs of SVT.  She felt her sx were longer than the duration of SVT on the monitor.   Denies : Chest pain. Dizziness. Leg edema. Nitroglycerin use. Orthopnea.  Paroxysmal nocturnal dyspnea. Shortness of breath. Syncope.    Past Medical History:  Diagnosis Date  . Acoustic neuroma ([place]) 05/18/2015  . Acquired deafness of left ear    S/P  RESECTION ACOUSTIC NEUROMA  2004  . Acquired facial asymmetry    post surgery  . Angio-edema   . Atrial fibrillation ([place]) 05/18/2015  . Fibroids 03/30/2019  . GERD (gastroesophageal reflux disease)   . Gestational diabetes mellitus    gestastional  . Hyperlipidemia   .  Hypothyroidism    hx of no meds now  . IC (interstitial cystitis)   . Migraine    "q several months" (05/22/2015)  . Neuromuscular disorder (HCC)    tremor to right side when upset   . PONV (postoperative nausea and vomiting)    atrial fib  . Premature atrial contractions   . PVC's (premature ventricular contractions)   . Recurrent oral ulcers 06/30/2019  . S/P myomectomy 03/30/2019  . Seasonal and perennial allergic rhinitis 06/30/2019  . SVT (supraventricular tachycardia) ([place])   . Syncope   . Urticaria     Past Surgical History:  Procedure  Laterality Date  . ACOUSTIC NEUROMA RESECTION Left 2004  . BRAIN SURGERY    . CATARACT EXTRACTION W/ INTRAOCULAR LENS IMPLANT Bilateral 01/2014  . COLONOSCOPY  11/2014  . CYSTO WITH HYDRODISTENSION N/A 05/18/2015   Procedure: CYSTOSCOPY/HYDRODISTENSION;  Surgeon: Carolan Clines, MD;  Location: [facility];  Service: Urology;  Laterality: N/A;  . CYSTO WITH HYDRODISTENSION N/A 03/30/2019   Procedure: CYSTOSCOPY/HYDRODISTENSION;  Surgeon: Alexis Frock, MD;  [facility];  Service: Urology;  Laterality: N/A;  . CYSTO/  HYDRODISTENTION/  INSTILLATION THERAPY  x4  last one 05-16-2010   since 1990's  . ESOPHAGOGASTRODUODENOSCOPY  11/2014  . HEMANGIOMA EXCISION Left 2005    FACIAL ANGIOMA REMOVAL / MASTOIDECTOMY  . HEMORRHOID BANDING  05/2015  . MASTOIDECTOMY Left 2005   LEFT FACIAL ANGIOMA REMOVAL /  LEFT MASTOIDECTOMY [Other]  . MYOMECTOMY N/A 03/30/2019   Procedure: ABDOMINAL MYOMECTOMY;  Surgeon: Dian Queen, MD;  Location[facility];  Service: Gynecology;  Laterality: N/A;  . TOTAL HIP ARTHROPLASTY Left 10/24/2016   Procedure: LEFT TOTAL HIP ARTHROPLASTY ANTERIOR APPROACH;  Surgeon: Mcarthur Rossetti, MD;  Location: [facility];  Service: Orthopedics;  Laterality: Left;  . [place]   "all 4"     Current Outpatient Medications  Medication Sig Dispense Refill  . ALPRAZolam (XANAX) 0.5 MG tablet Take 0.25 mg by mouth daily as needed for anxiety.   0  . AZO-CRANBERRY PO Take 1 tablet by mouth daily.    Marland Kitchen buPROPion (WELLBUTRIN XL) 150 MG 24 hr tablet Take 150 mg by mouth every morning.    . clindamycin (CLEOCIN) 300 MG capsule Take 300 mg by mouth as needed.    . COLLAGEN PO Take 0.5 [place] by mouth daily.    . meloxicam (MOBIC) 7.5 MG tablet Take 7.5 mg by mouth as needed.     . metoprolol tartrate (LOPRESSOR) 25 MG tablet Take 0.5 tablets (12.5 mg total) by mouth 2 (two) times daily as needed (fast or irregular heart rate). 90 tablet 3  .  Multiple Vitamin (MULTIVITAMIN) tablet Take 1 tablet by mouth daily.    . polyethylene glycol (MIRALAX / GLYCOLAX) packet Take 17 g by mouth daily.    . progesterone (PROMETRIUM) 100 MG capsule Take 100 mg by mouth at bedtime.    . rosuvastatin (CRESTOR) 5 MG tablet Take 5 mg by mouth daily.     No current facility-administered medications for this visit.   Facility-Administered Medications Ordered in Other Visits  Medication Dose Route Frequency Provider Last Rate Last Admin  . bupivacaine (MARCAINE) 0.5 % 10 mL, triamcinolone acetonide (KENALOG-40) 40 mg injection   Subcutaneous Once Carolan Clines, MD      . bupivacaine (MARCAINE) 0.5 % 15 mL, phenazopyridine (PYRIDIUM) 400 mg bladder mixture   Bladder Instillation Once Carolan Clines, MD  Allergies:   Fentanyl, Betadine [povidone iodine], Codeine, Other, Penicillins, Sulfa antibiotics, and Tetanus toxoids    Social History:  The patient  reports that she has never smoked. She has never used smokeless tobacco. She reports current alcohol use of about 1.0 standard drink of alcohol per week. She reports that she does not use drugs.   Family History:  The patient's family history is not on file. She was adopted.    ROS:  Please see the history of present illness.   Otherwise, review of systems are positive for palpitations.   All other systems are reviewed and negative.    PHYSICAL EXAM: VS:  BP 118/68   Pulse 70   Ht 5\' 10"  (1.778 m)   Wt 195 lb (88.5 kg)   SpO2 99%   BMI 27.98 kg/m  , BMI Body mass index is 27.98 kg/m. GEN: Well nourished, well developed, in no acute distress  HEENT: normal  Neck: no JVD, carotid bruits, or masses Cardiac: RRR; no murmurs, rubs, or gallops,no edema  Respiratory:  clear to auscultation bilaterally, normal work of breathing GI: soft, nontender, nondistended, + BS MS: no deformity or atrophy  Skin: warm and dry, no rash Neuro:  Strength and sensation are intact Psych: euthymic  mood, full affect   EKG:   The ekg ordered 7/21 demonstrates NSR, no ST changes   Recent Labs: 03/31/2019: Hemoglobin 11.2; Platelets 229   Lipid Panel No results found for: CHOL, TRIG, HDL, CHOLHDL, VLDL, LDLCALC, LDLDIRECT   Other studies Reviewed: Additional studies/ records that were reviewed today with results demonstrating: LDL 107; echo and monitor reviewed.   ASSESSMENT AND PLAN:  1. PAF: Took Metoprolol around the time of her GYN surgery in 2020. Now using very rarely, 5-6x/year.  Explained the step in escalating treatment if sx get worse.  2. Hypotension/autonomic dysfunction: Diagnosed by Dr. Lovena Le.  Staying hydrated. 3. PVCs/PACs: Can use metoprolol if they get to be too much.  Tolerable sx at this time.    Current medicines are reviewed at length with the patient today.  The patient concerns regarding her medicines were addressed.  The following changes have been made:  No change  Labs/ tests ordered today include:  No orders of the defined types were placed in this encounter.   Recommend 150 minutes/week of aerobic exercise Low fat, low carb, high fiber diet recommended  Disposition:   FU in 1 year   Signed, Larae Grooms, MD  03/28/2020 2:25 PM    [facility] [place], [place], [place]  [address] Phone: [phone]; Fax: [phone]

## 2020-03-28 ENCOUNTER — Encounter: Payer: Self-pay | Admitting: Interventional Cardiology

## 2020-03-28 ENCOUNTER — Ambulatory Visit (INDEPENDENT_AMBULATORY_CARE_PROVIDER_SITE_OTHER): Payer: PRIVATE HEALTH INSURANCE | Admitting: Interventional Cardiology

## 2020-03-28 ENCOUNTER — Other Ambulatory Visit: Payer: Self-pay

## 2020-03-28 ENCOUNTER — Other Ambulatory Visit: Payer: Self-pay | Admitting: Interventional Cardiology

## 2020-03-28 VITALS — BP 118/68 | HR 70 | Ht 70.0 in | Wt 195.0 lb

## 2020-03-28 DIAGNOSIS — I491 Atrial premature depolarization: Secondary | ICD-10-CM | POA: Diagnosis not present

## 2020-03-28 DIAGNOSIS — I480 Paroxysmal atrial fibrillation: Secondary | ICD-10-CM

## 2020-03-28 DIAGNOSIS — I471 Supraventricular tachycardia: Secondary | ICD-10-CM | POA: Diagnosis not present

## 2020-03-28 DIAGNOSIS — I493 Ventricular premature depolarization: Secondary | ICD-10-CM

## 2020-03-28 MED ORDER — METOPROLOL TARTRATE 25 MG PO TABS: 12.5000 mg | ORAL_TABLET | Freq: Two times a day (BID) | ORAL | 3 refills | Status: DC | PRN

## 2020-03-28 MED FILL — METOPROLOL TARTRATE 25 MG T: 25 | 30 days supply | Qty: 30 | Fill #0

## 2020-03-28 NOTE — Patient Instructions (Signed)

## 2020-04-04 ENCOUNTER — Telehealth: Payer: Self-pay | Admitting: Interventional Cardiology

## 2020-04-04 ENCOUNTER — Other Ambulatory Visit: Payer: Self-pay

## 2020-04-04 ENCOUNTER — Ambulatory Visit: Payer: PRIVATE HEALTH INSURANCE | Admitting: Interventional Cardiology

## 2020-04-04 ENCOUNTER — Encounter: Payer: Self-pay | Admitting: Interventional Cardiology

## 2020-04-04 ENCOUNTER — Other Ambulatory Visit: Payer: Self-pay | Admitting: Interventional Cardiology

## 2020-04-04 VITALS — BP 116/72 | HR 73 | Ht 70.0 in | Wt 196.0 lb

## 2020-04-04 DIAGNOSIS — I491 Atrial premature depolarization: Secondary | ICD-10-CM

## 2020-04-04 DIAGNOSIS — I493 Ventricular premature depolarization: Secondary | ICD-10-CM | POA: Diagnosis not present

## 2020-04-04 DIAGNOSIS — I480 Paroxysmal atrial fibrillation: Secondary | ICD-10-CM | POA: Diagnosis not present

## 2020-04-04 DIAGNOSIS — I471 Supraventricular tachycardia: Secondary | ICD-10-CM

## 2020-04-04 MED ORDER — METOPROLOL TARTRATE 25 MG PO TABS: 12.5000 mg | ORAL_TABLET | Freq: Two times a day (BID) | ORAL | 3 refills | Status: DC

## 2020-04-04 MED ORDER — RIVAROXABAN 20 MG PO TABS: 20.0000 mg | ORAL_TABLET | Freq: Every day | ORAL | 1 refills | Status: DC

## 2020-04-04 MED FILL — XARELTO 20 MG TABLET: 20 | 30 days supply | Qty: 30 | Fill #0

## 2020-04-04 NOTE — Telephone Encounter (Signed)
New message:     Patient calling to state that she thinks she might be gong threw AFIB BP pulse 179. Please call patient she would like to come in today. Please call patient,

## 2020-04-04 NOTE — Telephone Encounter (Signed)
I spoke with patient. She reports she woke up around 2 AM due to elevated heart rate.  Apple watch showed heart rate was 179.  She took 12.5 mg lopressor.  Did not recheck heart rate but could feel it was coming down.  She eventually went to sleep and woke up about 6 AM.  Apple watch showed she was still in afib.  Rate in the low 100's.  Currently watch shows afib with heart rate of 74-80.  She has not checked BP.  She feels tired, dizzy when standing and a little short of breath. States this is the longest episode she has had Will review with Dr Irish Lack

## 2020-04-04 NOTE — Patient Instructions (Addendum)
Medication Instructions:  Your physician has recommended you make the following change in your medication:   START: Xarelto 20mg  daily START: Metoprolol Tartrate 12.5mg  twice daily  *If you need a refill on your cardiac medications before your next appointment, please call your pharmacy*   Lab Work: TODAY: TSH, BMET, CBC  If you have labs (blood work) drawn today and your tests are completely normal, you will receive your results only by: Marland Kitchen MyChart Message (if you have MyChart) OR . A paper copy in the mail If you have any lab test that is abnormal or we need to change your treatment, we will call you to review the results.   Testing/Procedures: Your physician has recommended that you have a Cardioversion (DCCV). Electrical Cardioversion uses a jolt of electricity to your heart either through paddles or wired patches attached to your chest. This is a controlled, usually prescheduled, procedure. Defibrillation is done under light anesthesia in the hospital, and you usually go home the day of the procedure. This is done to get your heart back into a normal rhythm. You are not awake for the procedure. Please see the instruction sheet given to you today.   Follow-Up: At [facility], you and your health needs are our priority.  As part of our continuing mission to provide you with exceptional heart care, we have created designated Provider Care Teams.  These Care Teams include your primary Cardiologist (physician) and Advanced Practice Providers (APPs -  Physician Assistants and Nurse Practitioners) who all work together to provide you with the care you need, when you need it.  We recommend signing up for the patient portal called "MyChart".  Sign up information is provided on this After Visit Summary.  MyChart is used to connect with patients for Virtual Visits (Telemedicine).  Patients are able to view lab/test results, encounter notes, upcoming appointments, etc.  Non-urgent messages can be  sent to your provider as well.   To learn more about what you can do with MyChart, go to [email or web address].    Your next appointment:   04/16/2020  The format for your next appointment:   In Person  Provider:   Casandra Doffing, MD   Other Instructions Due to recent COVID-19 restrictions implemented by our local and state authorities and in an effort to keep both patients and staff as safe as possible, our hospital system requires COVID-19 testing prior to certain scheduled hospital procedures.  Please go to [email or web address], [place] [address] on 04/07/2020 at 9:50am  .  This is a drive up testing site.  You will not need to exit your vehicle.  You will not be billed at the time of testing but may receive a bill later depending on your insurance.  The approximate cost of the test is $100.  You must agree to self-quarantine from the time of your testing until the procedure date on 04/09/2020.  This should included staying home with ONLY the people you live with.  Avoid take-out, grocery store shopping or leaving the house for any non-emergent reason.  Failure to have your COVID-19 test done on the date and time you have been scheduled will result in cancellation of your procedure.  Please call our office at [phone] if you have any questions.   You are scheduled for a TEE/Cardioversion/TEE Cardioversion on 04/09/2020 with Dr. Harrington Challenger.  Please arrive at the [facility] (Main Entrance A) at [facility]: [address] [place], [place] [address] at 6:30 am.  DIET: Nothing to eat or drink after midnight except a sip of water with medications (see medication instructions below)  Medication Instructions:  Continue your anticoagulant: Xarelto You will need to continue your anticoagulant after your procedure until you are told by your  Provider that it is safe to stop   You must have a responsible person to drive you home and stay in the waiting area during your procedure. Failure  to do so could result in cancellation.  Bring your insurance cards.  *Special Note: Every effort is made to have your procedure done on time. Occasionally there are emergencies that occur at the hospital that may cause delays. Please be patient if a delay does occur.

## 2020-04-04 NOTE — Telephone Encounter (Signed)
Reviewed with Dr Irish Lack and he can see patient at 3:00 today.  I spoke with patient and she will be here for this appointment

## 2020-04-04 NOTE — Progress Notes (Signed)
Cardiology Office Note   Date:  04/04/2020   ID:  Kristin Bradford, DOB 1955/06/09, MRN [id number]  PCP:  Mayra Neer, MD    No chief complaint on file.    Wt Readings from Last 3 Encounters:  04/04/20 196 lb (88.9 kg)  03/28/20 195 lb (88.5 kg)  12/27/19 197 lb 3.2 oz (89.4 kg)       History of Present Illness: Kristin Bradford is a 64 y.o. female  with a h/o PAF.  Prior records from 2018 noted: "JHE:RDEY 2 documented occurrences, both immediately following surgery. She can tell when she is out of rhythm and She denies any recurrent breakthrough symptoms since her last surgery. RRR on exam today. HR and BP both stable. CHA2DS2 VASc scoreis 1, thus we will continue ASA only. She was seen in past by Dr. Lovena Le and declined Flecainide. We discussed PRN metoprolol, in the event that she experiences breakthrough symptoms/ increased HR. She is in agreement with this. Will order 25 mg tablets of metoprolol PRN for afib/ tachycardia. She was instructed to avoid triggers (caffieneand ETOH). F/u in 6 months with Dr. Irish Lack."  [facility] was seen by Dr. Lovena Le in the past and noted to have:  "PAF - she has had no recurrence. She had been on Eliquis. Her CHADSVASC score is 1. I have recommended stopping Eliquis. Low dose ASA would be an option. 2. Palpitations - she is symptomatic with he PAC's, PVC's and PAF. I have offered her flecainide. She is reflecting on this.  3. Hypotension - she appears to have some autonomic dysfunction by her history. I have recommended she increase her salt and fluid intake as tolerated.  She had GYN surgery with Dr. Helane Rima in 03/2019.  No arrhythmia at that time.   She checks her O2 sats regularly which are always normal.    She has more palpitations in the morning.  They occur rarely.  She can take a prn metoprolol with relief.    9/21 echo: "Left ventricular ejection fraction, by estimation, is 55 to 60%. The  left ventricle has normal function.  The left ventricle has no regional  wall motion abnormalities. Left ventricular diastolic parameters are  indeterminate.  2. Right ventricular systolic function is normal. The right ventricular  size is normal. There is normal pulmonary artery systolic pressure. The  estimated right ventricular systolic pressure is 81.4 mmHg.  3. The mitral valve is normal in structure. Trivial mitral valve  regurgitation. No evidence of mitral stenosis.  4. The aortic valve is tricuspid. Aortic valve regurgitation is not  visualized. No aortic stenosis is present.  5. The inferior vena cava is normal in size with greater than 50%  respiratory variability, suggesting right atrial pressure of 3 mmHg."  Monitor showed NSR, PACs, PVCs, short runs of SVT.  She felt her sx were longer than the duration of SVT on the monitor.    She has had AFib sx since early this AM.  She took metoprolol a few times and it did not resolve.    She presents today.  She feels poorly due to the AFib.  She did not tolerate Eliquis in the past.  She does not tolerate metoprolol doses > 12.5 mg at a time.   Past Medical History:  Diagnosis Date  . 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 Acquired deafness of left ear    S/P  RESECTION ACOUSTIC NEUROMA  2004  . Acquired facial asymmetry    post surgery  .  Angio-edema   . Atrial fibrillation ([place]) 05/18/2015  . Fibroids 03/30/2019  . GERD (gastroesophageal reflux disease)   . Gestational diabetes mellitus    gestastional  . Hyperlipidemia   . Hypothyroidism    hx of no meds now  . IC (interstitial cystitis)   . Migraine    "q several months" (05/22/2015)  . Neuromuscular disorder (HCC)    tremor to right side when upset   . PONV (postoperative nausea and vomiting)    atrial fib  . Premature atrial contractions   . PVC's (premature ventricular contractions)   . Recurrent oral ulcers 06/30/2019  . S/P myomectomy 03/30/2019  . Seasonal and perennial allergic rhinitis  06/30/2019  . SVT (supraventricular tachycardia) ([place])   . Syncope   . Urticaria     Past Surgical History:  Procedure Laterality Date  . ACOUSTIC NEUROMA RESECTION Left 2004  . BRAIN SURGERY    . CATARACT EXTRACTION W/ INTRAOCULAR LENS IMPLANT Bilateral 01/2014  . COLONOSCOPY  11/2014  . CYSTO WITH HYDRODISTENSION N/A 05/18/2015   Procedure: CYSTOSCOPY/HYDRODISTENSION;  Surgeon: Carolan Clines, MD;  Location: [facility];  Service: Urology;  Laterality: N/A;  . CYSTO WITH HYDRODISTENSION N/A 03/30/2019   Procedure: CYSTOSCOPY/HYDRODISTENSION;  Surgeon: Alexis Frock, MD;  Location: [facility];  Service: Urology;  Laterality: N/A;  . CYSTO/  HYDRODISTENTION/  INSTILLATION THERAPY  x4  last one 05-16-2010   since 1990's  . ESOPHAGOGASTRODUODENOSCOPY  11/2014  . HEMANGIOMA EXCISION Left 2005    FACIAL ANGIOMA REMOVAL / MASTOIDECTOMY  . HEMORRHOID BANDING  05/2015  . MASTOIDECTOMY Left 2005   LEFT FACIAL ANGIOMA REMOVAL /  LEFT MASTOIDECTOMY [Other]  . MYOMECTOMY N/A 03/30/2019   Procedure: ABDOMINAL MYOMECTOMY;  Surgeon: Dian Queen, MD;  Location: [facility];  Service: Gynecology;  Laterality: N/A;  . TOTAL HIP ARTHROPLASTY Left 10/24/2016   Procedure: LEFT TOTAL HIP ARTHROPLASTY ANTERIOR APPROACH;  Surgeon: Mcarthur Rossetti, MD;  Location: [facility];  Service: Orthopedics;  Laterality: Left;  . [place]   "all 4"     Current Outpatient Medications  Medication Sig Dispense Refill  . ALPRAZolam (XANAX) 0.5 MG tablet Take 0.25 mg by mouth daily as needed for anxiety.   0  . AZO-CRANBERRY PO Take 1 tablet by mouth daily.    Marland Kitchen buPROPion (WELLBUTRIN XL) 150 MG 24 hr tablet Take 150 mg by mouth every morning.    . clindamycin (CLEOCIN) 300 MG capsule Take 300 mg by mouth as needed.    . COLLAGEN PO Take 0.5 Scoops by mouth daily.    . meloxicam (MOBIC) 7.5 MG tablet Take 7.5 mg by mouth as needed.     . metoprolol tartrate (LOPRESSOR) 25 MG  tablet Take 0.5 tablets (12.5 mg total) by mouth 2 (two) times daily. 180 tablet 3  . Multiple Vitamin (MULTIVITAMIN) tablet Take 1 tablet by mouth daily.    . polyethylene glycol (MIRALAX / GLYCOLAX) packet Take 17 g by mouth daily.    . progesterone (PROMETRIUM) 100 MG capsule Take 100 mg by mouth at bedtime.    . rivaroxaban (XARELTO) 20 MG TABS tablet Take 1 tablet (20 mg total) by mouth daily with supper. 90 tablet 1  . rosuvastatin (CRESTOR) 5 MG tablet Take 5 mg by mouth daily.     No current facility-administered medications for this visit.   Facility-Administered Medications Ordered in Other Visits  Medication Dose Route Frequency Provider Last Rate Last Admin  . bupivacaine (MARCAINE) 0.5 %  10 mL, triamcinolone acetonide (KENALOG-40) 40 mg injection   Subcutaneous Once Carolan Clines, MD      . bupivacaine (MARCAINE) 0.5 % 15 mL, phenazopyridine (PYRIDIUM) 400 mg bladder mixture   Bladder Instillation Once Carolan Clines, MD        Allergies:   Fentanyl, Betadine [povidone iodine], Codeine, Other, Penicillins, Sulfa antibiotics, and Tetanus toxoids    Social History:  The patient  reports that she has never smoked. She has never used smokeless tobacco. She reports current alcohol use of about 1.0 standard drink of alcohol per week. She reports that she does not use drugs.   Family History:  The patient's family history is not on file. She was adopted.    ROS:  Please see the history of present illness.   Otherwise, review of systems are positive for fatigue.   All other systems are reviewed and negative.    PHYSICAL EXAM: VS:  BP 116/72   Pulse 73   Ht 5\' 10"  (1.778 m)   Wt 196 lb (88.9 kg)   SpO2 96%   BMI 28.12 kg/m  , BMI Body mass index is 28.12 kg/m. GEN: Well nourished, well developed, in no acute distress  HEENT: normal  Neck: no JVD, carotid bruits, or masses Cardiac: irregularly irregular; no murmurs, rubs, or gallops,no edema  Respiratory:  clear  to auscultation bilaterally, normal work of breathing GI: soft, nontender, nondistended, + BS MS: no deformity or atrophy  Skin: warm and dry, no rash Neuro:  Strength and sensation are intact Psych: euthymic mood, full affect   EKG:   The ekg ordered today demonstrates AFib with RVR   Recent Labs: No results found for requested labs within last 8760 hours.   Lipid Panel No results found for: CHOL, TRIG, HDL, CHOLHDL, VLDL, LDLCALC, LDLDIRECT   Other studies Reviewed: Additional studies/ records that were reviewed today with results demonstrating: 9/9/ echo showed normal LVEF. .   ASSESSMENT AND PLAN:  1. AFib: Very symptomatic.  Plan for TEE/CV.  All questions answered.  Take metoprolol 12.5 mg BID, Xarelto 20 mg daily.  We stressed importance of taking her Xarelto at the correct dose, and that if she missed a dose, she would not get the DCCV.  Sx started recently, she felt ok yesterday.  Since she has had some random palpitations in the past before yesterday, would check TEE.  2. Labs from 3/21 stable.  Echo reviewed.   3. PACs/PVCs/SVT: May need to consider flecainide to help with AFib and PACs, PVCs. 4. Stay hydrated given autonomic dysfunction.    Current medicines are reviewed at length with the patient today.  The patient concerns regarding her medicines were addressed.  The following changes have been made:  Add Xarelto, take metoprolol regularly  Labs/ tests ordered today include: for procedure  Orders Placed This Encounter  Procedures  . TSH  . Basic metabolic panel  . CBC  . EKG 12-Lead    Recommend 150 minutes/week of aerobic exercise Low fat, low carb, high fiber diet recommended  Disposition:   FU in 1 week after DCCV with APP.    Signed, Larae Grooms, MD  04/04/2020 4:48 PM    [facility] [address], [place], [place]  [address] Phone: [phone]; Fax: [phone]

## 2020-04-05 LAB — BASIC METABOLIC PANEL
BUN/Creatinine Ratio: 18 (ref 12–28)
BUN: 16 mg/dL (ref 8–27)
CO2: 25 mmol/L (ref 20–29)
Calcium: 9.8 mg/dL (ref 8.7–10.3)
Chloride: 103 mmol/L (ref 96–106)
Creatinine, Ser: 0.9 mg/dL (ref 0.57–1.00)
GFR calc Af Amer: 80 mL/min/{1.73_m2} (ref >59)
GFR calc non Af Amer: 69 mL/min/{1.73_m2} (ref >59)
Glucose: 92 mg/dL (ref 65–99)
Potassium: 4.3 mmol/L (ref 3.5–5.2)
Sodium: 140 mmol/L (ref 134–144)

## 2020-04-05 LAB — CBC
Hematocrit: 45.2 % (ref 34.0–46.6)
Hemoglobin: 14.7 g/dL (ref 11.1–15.9)
MCH: 28.8 pg (ref 26.6–33.0)
MCHC: 32.5 g/dL (ref 31.5–35.7)
MCV: 89 fL (ref 79–97)
Platelets: 336 10*3/uL (ref 150–450)
RBC: 5.1 x10E6/uL (ref 3.77–5.28)
RDW: 12.9 % (ref 11.7–15.4)
WBC: 7.5 10*3/uL (ref 3.4–10.8)

## 2020-04-05 LAB — TSH: TSH: 2.99 u[IU]/mL (ref 0.450–4.500)

## 2020-04-06 ENCOUNTER — Telehealth: Payer: Self-pay | Admitting: Interventional Cardiology

## 2020-04-06 NOTE — Telephone Encounter (Signed)
Left message for patient to call back.   Per Dr. Irish Lack, since she is back in NSR she will continue to take metoprolol and Xarelto for now and keep appointment with Dr. Irish Lack on 11/15.  TEE/DCCV and covid test have been cancelled.

## 2020-04-06 NOTE — Progress Notes (Signed)
Called for pre op call- pt informed me she just called to cancel her appt due to being in NSR. Will take her off schedule.

## 2020-04-06 NOTE — Telephone Encounter (Signed)
New Message:    Pt wants to cx procedure on 04-09-20 please. She says she is back in sinus rythm

## 2020-04-06 NOTE — Telephone Encounter (Signed)
Patient made aware of recommendations below.  

## 2020-04-07 ENCOUNTER — Other Ambulatory Visit (HOSPITAL_COMMUNITY): Payer: PRIVATE HEALTH INSURANCE

## 2020-04-09 ENCOUNTER — Ambulatory Visit (HOSPITAL_COMMUNITY)
Admission: RE | Admit: 2020-04-09 | Payer: PRIVATE HEALTH INSURANCE | Source: Home / Self Care | Admitting: Internal Medicine

## 2020-04-09 ENCOUNTER — Encounter (HOSPITAL_COMMUNITY): Admission: RE | Payer: Self-pay | Source: Home / Self Care

## 2020-04-09 SURGERY — ECHOCARDIOGRAM, TRANSESOPHAGEAL
Anesthesia: General

## 2020-04-12 MED FILL — ROSUVASTATIN CALCIUM 5 MG T: 5 | 30 days supply | Qty: 30 | Fill #4

## 2020-04-12 MED FILL — PROGESTERONE 100 MG CAPS: 100 | 30 days supply | Qty: 30 | Fill #7

## 2020-04-12 MED FILL — buPROPion HCL ER (XL) 150 M: 150 | 30 days supply | Qty: 30 | Fill #6

## 2020-04-15 NOTE — Progress Notes (Signed)
Cardiology Office Note   Date:  04/16/2020   ID:  Kristin Bradford, DOB 05-08-1956, MRN [id number]  PCP:  Kristin Neer, MD    No chief complaint on file.  AFib  Wt Readings from Last 3 Encounters:  04/16/20 198 lb 9.6 oz (90.1 kg)  04/04/20 196 lb (88.9 kg)  03/28/20 195 lb (88.5 kg)       History of Present Illness: Kristin Bradford is a 64 y.o. female  with a h/o PAF.  Prior records from 2018 noted: "CHY:IFOY 2 documented occurrences, both immediately following surgery. She can tell when she is out of rhythm and She denies any recurrent breakthrough symptoms since her last surgery. RRR on exam today. HR and BP both stable. CHA2DS2 VASc scoreis 1, thus we will continue ASA only. She was seen in past by Dr. Lovena Le and declined Flecainide. We discussed PRN metoprolol, in the event that she experiences breakthrough symptoms/ increased HR. She is in agreement with this. Will order 25 mg tablets of metoprolol PRN for afib/ tachycardia. She was instructed to avoid triggers (caffieneand ETOH). F/u in 6 months with Dr. Irish Lack."  [facility] was seen by Dr. Lovena Le in the past and noted to have:  "PAF - she has had no recurrence. She had been on Eliquis. Her CHADSVASC score is 1. I have recommended stopping Eliquis. Low dose ASA would be an option. 2. Palpitations - she is symptomatic with he PAC's, PVC's and PAF. I have offered her flecainide. She is reflecting on this.  3. Hypotension - she appears to have some autonomic dysfunction by her history. I have recommended she increase her salt and fluid intake as tolerated.  She hadGYN surgery with Dr. Helane Rima in 03/2019. No arrhythmia at that time.   She checks her O2 sats regularly which are always normal.   She has more palpitations in the morning. They occur rarely. She can take a prn metoprolol with relief.    9/21 echo: "Left ventricular ejection fraction, by estimation, is 55 to 60%. The  left ventricle has normal  function. The left ventricle has no regional  wall motion abnormalities. Left ventricular diastolic parameters are  indeterminate.  2. Right ventricular systolic function is normal. The right ventricular  size is normal. There is normal pulmonary artery systolic pressure. The  estimated right ventricular systolic pressure is 77.4 mmHg.  3. The mitral valve is normal in structure. Trivial mitral valve  regurgitation. No evidence of mitral stenosis.  4. The aortic valve is tricuspid. Aortic valve regurgitation is not  visualized. No aortic stenosis is present.  5. The inferior vena cava is normal in size with greater than 50%  respiratory variability, suggesting right atrial pressure of 3 mmHg."  Monitor showed NSR, PACs, PVCs, short runs of SVT. She felt her sx were longer than the duration of SVT on the monitor.   She did not tolerate Eliquis in the past.  She does not tolerate metoprolol doses > 12.5 mg at a time.   In 11/21, had a persistent episode of AFib.  Metoprolol was increased and she was set up for TEE /CV after anticoagulation was started.   Since the last visit, she converted to NSR on her own before the TEE/DCCV.  She decreased metoprolol but has maintained NSR.      Past Medical History:  Diagnosis Date  . 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 Acquired deafness of left ear    S/P  RESECTION ACOUSTIC NEUROMA  2004  .  Acquired facial asymmetry    post surgery  . Angio-edema   . Atrial fibrillation ([place]) 05/18/2015  . Fibroids 03/30/2019  . GERD (gastroesophageal reflux disease)   . Gestational diabetes mellitus    gestastional  . Hyperlipidemia   . Hypothyroidism    hx of no meds now  . IC (interstitial cystitis)   . Migraine    "q several months" (05/22/2015)  . Neuromuscular disorder (HCC)    tremor to right side when upset   . PONV (postoperative nausea and vomiting)    atrial fib  . Premature atrial contractions   . PVC's (premature ventricular  contractions)   . Recurrent oral ulcers 06/30/2019  . S/P myomectomy 03/30/2019  . Seasonal and perennial allergic rhinitis 06/30/2019  . SVT (supraventricular tachycardia) ([place])   . Syncope   . Urticaria     Past Surgical History:  Procedure Laterality Date  . ACOUSTIC NEUROMA RESECTION Left 2004  . BRAIN SURGERY    . CATARACT EXTRACTION W/ INTRAOCULAR LENS IMPLANT Bilateral 01/2014  . COLONOSCOPY  11/2014  . CYSTO WITH HYDRODISTENSION N/A 05/18/2015   Procedure: CYSTOSCOPY/HYDRODISTENSION;  Surgeon: Carolan Clines, MD;  Location: [facility];  Service: Urology;  Laterality: N/A;  . CYSTO WITH HYDRODISTENSION N/A 03/30/2019   Procedure: CYSTOSCOPY/HYDRODISTENSION;  Surgeon: Alexis Frock, MD;  Location: [facility];  Service: Urology;  Laterality: N/A;  . CYSTO/  HYDRODISTENTION/  INSTILLATION THERAPY  x4  last one 05-16-2010   since 1990's  . ESOPHAGOGASTRODUODENOSCOPY  11/2014  . HEMANGIOMA EXCISION Left 2005    FACIAL ANGIOMA REMOVAL / MASTOIDECTOMY  . HEMORRHOID BANDING  05/2015  . MASTOIDECTOMY Left 2005   LEFT FACIAL ANGIOMA REMOVAL /  LEFT MASTOIDECTOMY [Other]  . MYOMECTOMY N/A 03/30/2019   Procedure: ABDOMINAL MYOMECTOMY;  Surgeon: Dian Queen, MD;  Location: [facility];  Service: Gynecology;  Laterality: N/A;  . TOTAL HIP ARTHROPLASTY Left 10/24/2016   Procedure: LEFT TOTAL HIP ARTHROPLASTY ANTERIOR APPROACH;  Surgeon: Mcarthur Rossetti, MD;  Location: [facility];  Service: Orthopedics;  Laterality: Left;  . [place]   "all 4"     Current Outpatient Medications  Medication Sig Dispense Refill  . ALPRAZolam (XANAX) 0.5 MG tablet Take 0.25 mg by mouth daily as needed for anxiety.   0  . AZO-CRANBERRY PO Take 1 tablet by mouth daily.    Marland Kitchen buPROPion (WELLBUTRIN XL) 150 MG 24 hr tablet Take 150 mg by mouth every morning.    . clindamycin (CLEOCIN) 300 MG capsule Take 300 mg by mouth as needed.    . COLLAGEN PO Take 0.5 Scoops by mouth  daily.    . meloxicam (MOBIC) 7.5 MG tablet Take 7.5 mg by mouth as needed.     . metoprolol tartrate (LOPRESSOR) 25 MG tablet Take 12.5 mg by mouth daily.    . Multiple Vitamin (MULTIVITAMIN) tablet Take 1 tablet by mouth daily.    . polyethylene glycol (MIRALAX / GLYCOLAX) packet Take 17 g by mouth daily.    . progesterone (PROMETRIUM) 100 MG capsule Take 100 mg by mouth at bedtime.    . rivaroxaban (XARELTO) 20 MG TABS tablet Take 1 tablet (20 mg total) by mouth daily with supper. 90 tablet 1  . rosuvastatin (CRESTOR) 5 MG tablet Take 5 mg by mouth daily.     No current facility-administered medications for this visit.   Facility-Administered Medications Ordered in Other Visits  Medication Dose Route Frequency Provider Last Rate Last Admin  .  bupivacaine (MARCAINE) 0.5 % 10 mL, triamcinolone acetonide (KENALOG-40) 40 mg injection   Subcutaneous Once Carolan Clines, MD      . bupivacaine (MARCAINE) 0.5 % 15 mL, phenazopyridine (PYRIDIUM) 400 mg bladder mixture   Bladder Instillation Once Carolan Clines, MD        Allergies:   Fentanyl, Betadine [povidone iodine], Codeine, Other, Penicillins, Sulfa antibiotics, and Tetanus toxoids    Social History:  The patient  reports that she has never smoked. She has never used smokeless tobacco. She reports current alcohol use of about 1.0 standard drink of alcohol per week. She reports that she does not use drugs.   Family History:  The patient's family history is not on file. She was adopted.    ROS:  Please see the history of present illness.   Otherwise, review of systems are positive for palpitations in the setting of AFib.   All other systems are reviewed and negative.    PHYSICAL EXAM: VS:  BP 118/72   Pulse (!) 53   Ht 5\' 11"  (1.803 m)   Wt 198 lb 9.6 oz (90.1 kg)   SpO2 97%   BMI 27.70 kg/m  , BMI Body mass index is 27.7 kg/m. GEN: Well nourished, well developed, in no acute distress  HEENT: normal  Neck: no JVD,  carotid bruits, or masses Cardiac: RRR; no murmurs, rubs, or gallops,no edema  Respiratory:  clear to auscultation bilaterally, normal work of breathing GI: soft, nontender, nondistended, + BS MS: no deformity or atrophy  Skin: warm and dry, no rash Neuro:  Strength and sensation are intact Psych: euthymic mood, full affect   EKG:   The ekg ordered today demonstrates sinus bradycardia, no ST changes   Recent Labs: 04/04/2020: BUN 16; Creatinine, Ser 0.90; Hemoglobin 14.7; Platelets 336; Potassium 4.3; Sodium 140; TSH 2.990   Lipid Panel No results found for: CHOL, TRIG, HDL, CHOLHDL, VLDL, LDLCALC, LDLDIRECT   Other studies Reviewed: Additional studies/ records that were reviewed today with results demonstrating: prior ECG showed AFib RVR.   ASSESSMENT AND PLAN:  1. AFib: Back in NSR. Did not need TEE/DCCV.  Continue Xarelto for now. Will have her see EP to discuss AAD given that she is so symptomatic with AFib.  She is quite sensitive to meds as well so this may make management difficult.  At some point, ablation may need to be considered.  2. PACs/PVCs/SVT: Still feeling some ocassional premature beats.  3. Autonomic dysfunction: stay hydrated.    Current medicines are reviewed at length with the patient today.  The patient concerns regarding her medicines were addressed.  The following changes have been made:  No change  Labs/ tests ordered today include:  No orders of the defined types were placed in this encounter.   Recommend 150 minutes/week of aerobic exercise Low fat, low carb, high fiber diet recommended  Disposition:   FU based on EP consult   Signed, Larae Grooms, MD  04/16/2020 3:07 PM    [facility] [address], [place], [place]  [address] Phone: [phone]; Fax: [phone]

## 2020-04-16 ENCOUNTER — Encounter: Payer: Self-pay | Admitting: Interventional Cardiology

## 2020-04-16 ENCOUNTER — Other Ambulatory Visit: Payer: Self-pay

## 2020-04-16 ENCOUNTER — Ambulatory Visit: Payer: PRIVATE HEALTH INSURANCE | Admitting: Interventional Cardiology

## 2020-04-16 VITALS — BP 118/72 | HR 53 | Ht 71.0 in | Wt 198.6 lb

## 2020-04-16 DIAGNOSIS — I493 Ventricular premature depolarization: Secondary | ICD-10-CM | POA: Diagnosis not present

## 2020-04-16 DIAGNOSIS — I471 Supraventricular tachycardia: Secondary | ICD-10-CM | POA: Diagnosis not present

## 2020-04-16 DIAGNOSIS — I480 Paroxysmal atrial fibrillation: Secondary | ICD-10-CM

## 2020-04-16 DIAGNOSIS — I491 Atrial premature depolarization: Secondary | ICD-10-CM | POA: Diagnosis not present

## 2020-04-16 NOTE — Patient Instructions (Signed)
Medication Instructions:  Your physician recommends that you continue on your current medications as directed. Please refer to the Current Medication list given to you today.  *If you need a refill on your cardiac medications before your next appointment, please call your pharmacy*   Lab Work: None  If you have labs (blood work) drawn today and your tests are completely normal, you will receive your results only by:  [place] (if you have MyChart) OR  A paper copy in the mail If you have any lab test that is abnormal or we need to change your treatment, we will call you to review the results.   Testing/Procedures: None  Follow-Up: You have been referred to Dr. Lovena Le to discuss anti-arrhythmic therapy   Other Instructions None

## 2020-04-18 MED FILL — DESVENLAFAXINE SUC ER 50 MG: 50 | 30 days supply | Qty: 30 | Fill #0

## 2020-04-24 ENCOUNTER — Other Ambulatory Visit: Payer: Self-pay

## 2020-04-24 ENCOUNTER — Encounter: Payer: Self-pay | Admitting: Internal Medicine

## 2020-04-24 ENCOUNTER — Other Ambulatory Visit: Payer: Self-pay | Admitting: Internal Medicine

## 2020-04-24 ENCOUNTER — Ambulatory Visit: Payer: PRIVATE HEALTH INSURANCE | Admitting: Internal Medicine

## 2020-04-24 VITALS — BP 134/64 | HR 54 | Ht 71.0 in | Wt 198.8 lb

## 2020-04-24 DIAGNOSIS — I480 Paroxysmal atrial fibrillation: Secondary | ICD-10-CM

## 2020-04-24 MED ORDER — METOPROLOL SUCCINATE ER 25 MG PO TB24: 25.0000 mg | ORAL_TABLET | Freq: Every day | ORAL | 3 refills | Status: DC

## 2020-04-24 MED FILL — METOPROLOL SUCCINATE ER 25: 25 | 30 days supply | Qty: 30 | Fill #0

## 2020-04-24 NOTE — Progress Notes (Signed)
HPI Kristin Bradford returns today after an almost 5 year absence from our arrhythmia clinic. She is a pleasant 64 yo woman with a h/o PAF which initially only occurred after any surgery. Over the years it has increased in frequency and duration. She has persistent atrial fib but did not require DCCV after taking additional beta blocker. Previously I had offered her flecainide but she declined. She has trouble with low bp at times in the past.  Allergies  Allergen Reactions  . Fentanyl Nausea And Vomiting  . Betadine [Povidone Iodine] Rash  . Codeine Nausea And Vomiting  . Other     Steroid injections make her hair fall out  . Penicillins Itching    Has patient had a PCN reaction causing immediate rash, facial/tongue/throat swelling, SOB or lightheadedness with hypotension: Yes Has patient had a PCN reaction causing severe rash involving mucus membranes or skin necrosis: No Has patient had a PCN reaction that required hospitalization No Has patient had a PCN reaction occurring within the last 10 years: No If all of the above answers are "NO", then may proceed with Cephalosporin use.   . Sulfa Antibiotics Swelling    Lips  . Tetanus Toxoids Nausea And Vomiting and Other (See Comments)    High fever     Current Outpatient Medications  Medication Sig Dispense Refill  . ALPRAZolam (XANAX) 0.5 MG tablet Take 0.25 mg by mouth daily as needed for anxiety.   0  . AZO-CRANBERRY PO Take 1 tablet by mouth daily.    Marland Kitchen buPROPion (WELLBUTRIN XL) 150 MG 24 hr tablet Take 150 mg by mouth every morning.    . clindamycin (CLEOCIN) 300 MG capsule Take 300 mg by mouth as needed.    . COLLAGEN PO Take 0.5 Scoops by mouth daily.    . meloxicam (MOBIC) 7.5 MG tablet Take 7.5 mg by mouth as needed.     . metoprolol tartrate (LOPRESSOR) 25 MG tablet Take 12.5 mg by mouth daily.    . Multiple Vitamin (MULTIVITAMIN) tablet Take 1 tablet by mouth daily.    . polyethylene glycol (MIRALAX / GLYCOLAX) packet  Take 17 g by mouth daily.    . progesterone (PROMETRIUM) 100 MG capsule Take 100 mg by mouth at bedtime.    . rivaroxaban (XARELTO) 20 MG TABS tablet Take 1 tablet (20 mg total) by mouth daily with supper. 90 tablet 1  . rosuvastatin (CRESTOR) 5 MG tablet Take 5 mg by mouth daily.    Marland Kitchen desvenlafaxine (PRISTIQ) 50 MG 24 hr tablet Take 50 mg by mouth daily.     No current facility-administered medications for this visit.   Facility-Administered Medications Ordered in Other Visits  Medication Dose Route Frequency Provider Last Rate Last Admin  . bupivacaine (MARCAINE) 0.5 % 10 mL, triamcinolone acetonide (KENALOG-40) 40 mg injection   Subcutaneous Once Carolan Clines, MD      . bupivacaine (MARCAINE) 0.5 % 15 mL, phenazopyridine (PYRIDIUM) 400 mg bladder mixture   Bladder Instillation Once Carolan Clines, MD         Past Medical History:  Diagnosis Date  . 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 Acquired deafness of left ear    S/P  RESECTION ACOUSTIC NEUROMA  2004  . Acquired facial asymmetry    post surgery  . Angio-edema   . Atrial fibrillation ([place]) 05/18/2015  . Fibroids 03/30/2019  . GERD (gastroesophageal reflux disease)   . Gestational diabetes mellitus    gestastional  .  Hyperlipidemia   . Hypothyroidism    hx of no meds now  . IC (interstitial cystitis)   . Migraine    "q several months" (05/22/2015)  . Neuromuscular disorder ([facility])    tremor to right side when upset   . PONV (postoperative nausea and vomiting)    atrial fib  . Premature atrial contractions   . PVC's (premature ventricular contractions)   . Recurrent oral ulcers 06/30/2019  . S/P myomectomy 03/30/2019  . Seasonal and perennial allergic rhinitis 06/30/2019  . SVT (supraventricular tachycardia) ([place])   . Syncope   . Urticaria     ROS:   All systems reviewed and negative except as noted in the HPI.   Past Surgical History:  Procedure Laterality Date  . ACOUSTIC NEUROMA RESECTION Left  2004  . BRAIN SURGERY    . CATARACT EXTRACTION W/ INTRAOCULAR LENS IMPLANT Bilateral 01/2014  . COLONOSCOPY  11/2014  . CYSTO WITH HYDRODISTENSION N/A 05/18/2015   Procedure: CYSTOSCOPY/HYDRODISTENSION;  Surgeon: Carolan Clines, MD;  Location: [facility];  Service: Urology;  Laterality: N/A;  . CYSTO WITH HYDRODISTENSION N/A 03/30/2019   Procedure: CYSTOSCOPY/HYDRODISTENSION;  Surgeon: Alexis Frock, MD;  Location: [facility];  Service: Urology;  Laterality: N/A;  . CYSTO/  HYDRODISTENTION/  INSTILLATION THERAPY  x4  last one 05-16-2010   since 1990's  . ESOPHAGOGASTRODUODENOSCOPY  11/2014  . HEMANGIOMA EXCISION Left 2005    FACIAL ANGIOMA REMOVAL / MASTOIDECTOMY  . HEMORRHOID BANDING  05/2015  . MASTOIDECTOMY Left 2005   LEFT FACIAL ANGIOMA REMOVAL /  LEFT MASTOIDECTOMY [Other]  . MYOMECTOMY N/A 03/30/2019   Procedure: ABDOMINAL MYOMECTOMY;  Surgeon: Dian Queen, MD;  Location: [facility];  Service: Gynecology;  Laterality: N/A;  . TOTAL HIP ARTHROPLASTY Left 10/24/2016   Procedure: LEFT TOTAL HIP ARTHROPLASTY ANTERIOR APPROACH;  Surgeon: Mcarthur Rossetti, MD;  Location: [facility];  Service: Orthopedics;  Laterality: Left;  . [place]   "all 4"     Family History  Adopted: Yes     Social History   Socioeconomic History  . Marital status: Legally Separated    Spouse name: Not on file  . Number of children: Not on file  . Years of education: Not on file  . Highest education level: Not on file  Occupational History  . Not on file  Tobacco Use  . Smoking status: Never Smoker  . Smokeless tobacco: Never Used  Vaping Use  . Vaping Use: Never used  Substance and Sexual Activity  . Alcohol use: Yes    Alcohol/week: 1.0 standard drink    Types: 1 Glasses of wine per week    Comment: rare  . Drug use: No  . Sexual activity: Not Currently    Birth control/protection: None  Other Topics Concern  . Not on file  Social History  Narrative  . Not on file   Social Determinants of Health   Financial Resource Strain:   . Difficulty of Paying Living Expenses: Not on file  Food Insecurity:   . Worried About Charity fundraiser in the Last Year: Not on file  . Ran Out of Food in the Last Year: Not on file  Transportation Needs:   . Lack of Transportation (Medical): Not on file  . Lack of Transportation (Non-Medical): Not on file  Physical Activity:   . Days of Exercise per Week: Not on file  . Minutes of Exercise per Session: Not on file  Stress:   .  Feeling of Stress : Not on file  Social Connections:   . Frequency of Communication with Friends and Family: Not on file  . Frequency of Social Gatherings with Friends and Family: Not on file  . Attends Religious Services: Not on file  . Active Member of Clubs or Organizations: Not on file  . Attends Archivist Meetings: Not on file  . Marital Status: Not on file  Intimate Partner Violence:   . Fear of Current or Ex-Partner: Not on file  . Emotionally Abused: Not on file  . Physically Abused: Not on file  . Sexually Abused: Not on file     BP 134/64   Pulse (!) 54   Ht 5\' 11"  (1.803 m)   Wt 198 lb 12.8 oz (90.2 kg)   SpO2 94%   BMI 27.73 kg/m   Physical Exam:  Well appearing NAD HEENT: Unremarkable Neck:  No JVD, no thyromegally Lymphatics:  No adenopathy Back:  No CVA tenderness Lungs:  Clear HEART:  Regular rate rhythm, no murmurs, no rubs, no clicks Abd:  soft, positive bowel sounds, no organomegally, no rebound, no guarding Ext:  2 plus pulses, no edema, no cyanosis, no clubbing Skin:  No rashes no nodules Neuro:  CN II through XII intact, motor grossly intact  EKG - sinus bradycardia   Assess/Plan: 1. PAF - her symptoms have gradually worsened. I discussed the various treatment options. I have recommended she take daily long acting toprol and short acting metoprolol as needed. We also discussed flecainide and catheter  ablation. 2. Coags - she was placed on xarelto but her CHADSVASC is 1. She does not have HTN. She will stop the xarelto.  Carleene Overlie Jessie Schrieber,MD

## 2020-04-24 NOTE — Patient Instructions (Addendum)
Medication Instructions:  Your physician has recommended you make the following change in your medication:   1.  START taking metoprolol succinate (Toprol XL) 25 mg- Take one tablet by mouth daily at bedtime.  2.  STOP taking Xarelto  3.  CHANGE your metoprolol tartrate 25 mg- Take 1/2 tablet by mouth AS NEEDED for breakthrough palpitations.   Labwork: None ordered.  Testing/Procedures: None ordered.  Follow-Up: Your physician wants you to follow-up in: 4 months with Dr. Lovena Le.     August 13, 2020 at 10:45 am at the [facility] office   Any Other Special Instructions Will Be Listed Below (If Applicable).  If you need a refill on your cardiac medications before your next appointment, please call your pharmacy.

## 2020-05-15 MED FILL — buPROPion HCL ER (XL) 150 M: 150 | 30 days supply | Qty: 30 | Fill #7

## 2020-05-15 MED FILL — PROGESTERONE 100 MG CAPS: 100 | 30 days supply | Qty: 30 | Fill #8

## 2020-05-15 MED FILL — ROSUVASTATIN CALCIUM 5 MG T: 5 | 30 days supply | Qty: 30 | Fill #5

## 2020-05-15 MED FILL — DESVENLAFAXINE SUC ER 50 MG: 50 | 30 days supply | Qty: 30 | Fill #1

## 2020-06-11 MED FILL — METOPROLOL SUCCINATE ER 25: 25 | 30 days supply | Qty: 30 | Fill #1

## 2020-06-13 MED FILL — PROGESTERONE 100 MG CAPS: 100 | 30 days supply | Qty: 30 | Fill #9

## 2020-06-13 MED FILL — buPROPion HCL ER (XL) 150 M: 150 | 30 days supply | Qty: 30 | Fill #8

## 2020-06-13 MED FILL — DESVENLAFAXINE SUC ER 50 MG: 50 | 30 days supply | Qty: 30 | Fill #2

## 2020-06-13 MED FILL — ROSUVASTATIN CALCIUM 5 MG T: 5 | 30 days supply | Qty: 30 | Fill #6

## 2020-07-12 MED FILL — ROSUVASTATIN CALCIUM 5 MG T: 5 | 30 days supply | Qty: 30 | Fill #7

## 2020-07-12 MED FILL — buPROPion HCL ER (XL) 150 M: 150 | 30 days supply | Qty: 30 | Fill #9

## 2020-08-10 MED FILL — PROGESTERONE 100 MG CAPS: 100 | 30 days supply | Qty: 30 | Fill #10

## 2020-08-10 MED FILL — buPROPion HCL ER (XL) 150 M: 150 | 30 days supply | Qty: 30 | Fill #10

## 2020-08-10 MED FILL — METOPROLOL SUCCINATE ER 25: 25 | 30 days supply | Qty: 30 | Fill #2

## 2020-08-10 MED FILL — ROSUVASTATIN CALCIUM 5 MG T: 5 | 30 days supply | Qty: 30 | Fill #8

## 2020-08-13 ENCOUNTER — Encounter (INDEPENDENT_AMBULATORY_CARE_PROVIDER_SITE_OTHER): Payer: Self-pay

## 2020-08-13 ENCOUNTER — Other Ambulatory Visit: Payer: Self-pay

## 2020-08-13 ENCOUNTER — Encounter: Payer: Self-pay | Admitting: Internal Medicine

## 2020-08-13 ENCOUNTER — Ambulatory Visit: Payer: PRIVATE HEALTH INSURANCE | Admitting: Internal Medicine

## 2020-08-13 VITALS — BP 128/80 | HR 65 | Ht 71.0 in | Wt 209.8 lb

## 2020-08-13 DIAGNOSIS — I480 Paroxysmal atrial fibrillation: Secondary | ICD-10-CM

## 2020-08-13 NOTE — Patient Instructions (Signed)

## 2020-08-13 NOTE — Progress Notes (Signed)
HPI Ms. Warsame returns today for ongoing evaluation of palpitations. She is a pleasant 65 yo woman with a h/o PVC's, PAC's, and very brief post op atrial fib. She had previously not well tolerated her calcium channel blocker due to atrial fib. I prescribed her toprol and she is taking a half tablet daily. Her symptoms are mostly well controlled.  Allergies  Allergen Reactions  . Fentanyl Nausea And Vomiting  . Betadine [Povidone Iodine] Rash  . Codeine Nausea And Vomiting  . Other     Steroid injections make her hair fall out  . Penicillins Itching    Has patient had a PCN reaction causing immediate rash, facial/tongue/throat swelling, SOB or lightheadedness with hypotension: Yes Has patient had a PCN reaction causing severe rash involving mucus membranes or skin necrosis: No Has patient had a PCN reaction that required hospitalization No Has patient had a PCN reaction occurring within the last 10 years: No If all of the above answers are "NO", then may proceed with Cephalosporin use.   . Sulfa Antibiotics Swelling    Lips  . Tetanus Toxoids Nausea And Vomiting and Other (See Comments)    High fever     Current Outpatient Medications  Medication Sig Dispense Refill  . ALPRAZolam (XANAX) 0.5 MG tablet Take 0.25 mg by mouth daily as needed for anxiety.   0  . AZO-CRANBERRY PO Take 1 tablet by mouth daily.    Marland Kitchen buPROPion (WELLBUTRIN XL) 150 MG 24 hr tablet Take 150 mg by mouth every morning.    . clindamycin (CLEOCIN) 300 MG capsule Take 300 mg by mouth as needed.    . COLLAGEN PO Take 0.5 Scoops by mouth daily.    Marland Kitchen desvenlafaxine (PRISTIQ) 50 MG 24 hr tablet Take 50 mg by mouth daily.    . meloxicam (MOBIC) 7.5 MG tablet Take 7.5 mg by mouth as needed.     . metoprolol succinate (TOPROL XL) 25 MG 24 hr tablet Take 1 tablet (25 mg total) by mouth daily. 90 tablet 3  . metoprolol tartrate (LOPRESSOR) 25 MG tablet Take 12.5 mg by mouth daily as needed.    . Multiple Vitamin  (MULTIVITAMIN) tablet Take 1 tablet by mouth daily.    . polyethylene glycol (MIRALAX / GLYCOLAX) packet Take 17 g by mouth daily.    . progesterone (PROMETRIUM) 100 MG capsule Take 100 mg by mouth at bedtime.    . rosuvastatin (CRESTOR) 5 MG tablet Take 5 mg by mouth daily.     No current facility-administered medications for this visit.   Facility-Administered Medications Ordered in Other Visits  Medication Dose Route Frequency Provider Last Rate Last Admin  . bupivacaine (MARCAINE) 0.5 % 10 mL, triamcinolone acetonide (KENALOG-40) 40 mg injection   Subcutaneous Once Carolan Clines, MD      . bupivacaine (MARCAINE) 0.5 % 15 mL, phenazopyridine (PYRIDIUM) 400 mg bladder mixture   Bladder Instillation Once Carolan Clines, MD         Past Medical History:  Diagnosis Date  . 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 Acquired deafness of left ear    S/P  RESECTION ACOUSTIC NEUROMA  2004  . Acquired facial asymmetry    post surgery  . Angio-edema   . Atrial fibrillation ([place]) 05/18/2015  . Fibroids 03/30/2019  . GERD (gastroesophageal reflux disease)   . Gestational diabetes mellitus    gestastional  . Hyperlipidemia   . Hypothyroidism    hx of no meds now  .  IC (interstitial cystitis)   . Migraine    "q several months" (05/22/2015)  . Neuromuscular disorder (HCC)    tremor to right side when upset   . PONV (postoperative nausea and vomiting)    atrial fib  . Premature atrial contractions   . PVC's (premature ventricular contractions)   . Recurrent oral ulcers 06/30/2019  . S/P myomectomy 03/30/2019  . Seasonal and perennial allergic rhinitis 06/30/2019  . SVT (supraventricular tachycardia) ([place])   . Syncope   . Urticaria     ROS:   All systems reviewed and negative except as noted in the HPI.   Past Surgical History:  Procedure Laterality Date  . ACOUSTIC NEUROMA RESECTION Left 2004  . BRAIN SURGERY    . CATARACT EXTRACTION W/ INTRAOCULAR LENS IMPLANT Bilateral  01/2014  . COLONOSCOPY  11/2014  . CYSTO WITH HYDRODISTENSION N/A 05/18/2015   Procedure: CYSTOSCOPY/HYDRODISTENSION;  Surgeon: Carolan Clines, MD;  Location[facility];  Service: Urology;  Laterality: N/A;  . CYSTO WITH HYDRODISTENSION N/A 03/30/2019   Procedure: CYSTOSCOPY/HYDRODISTENSION;  Surgeon: Alexis Frock, MD;  Location: [facility];  Service: Urology;  Laterality: N/A;  . CYSTO/  HYDRODISTENTION/  INSTILLATION THERAPY  x4  last one 05-16-2010   since 1990's  . ESOPHAGOGASTRODUODENOSCOPY  11/2014  . HEMANGIOMA EXCISION Left 2005    FACIAL ANGIOMA REMOVAL / MASTOIDECTOMY  . HEMORRHOID BANDING  05/2015  . MASTOIDECTOMY Left 2005   LEFT FACIAL ANGIOMA REMOVAL /  LEFT MASTOIDECTOMY [Other]  . MYOMECTOMY N/A 03/30/2019   Procedure: ABDOMINAL MYOMECTOMY;  Surgeon: Dian Queen, MD;  Location: [facility];  Service: Gynecology;  Laterality: N/A;  . TOTAL HIP ARTHROPLASTY Left 10/24/2016   Procedure: LEFT TOTAL HIP ARTHROPLASTY ANTERIOR APPROACH;  Surgeon: Mcarthur Rossetti, MD;  Location: [facility];  Service: Orthopedics;  Laterality: Left;  . [place]   "all 4"     Family History  Adopted: Yes     Social History   Socioeconomic History  . Marital status: Legally Separated    Spouse name: Not on file  . Number of children: Not on file  . Years of education: Not on file  . Highest education level: Not on file  Occupational History  . Not on file  Tobacco Use  . Smoking status: Never Smoker  . Smokeless tobacco: Never Used  Vaping Use  . Vaping Use: Never used  Substance and Sexual Activity  . Alcohol use: Yes    Alcohol/week: 1.0 standard drink    Types: 1 Glasses of wine per week    Comment: rare  . Drug use: No  . Sexual activity: Not Currently    Birth control/protection: None  Other Topics Concern  . Not on file  Social History Narrative  . Not on file   Social Determinants of Health   Financial Resource Strain: Not  on file  Food Insecurity: Not on file  Transportation Needs: Not on file  Physical Activity: Not on file  Stress: Not on file  Social Connections: Not on file  Intimate Partner Violence: Not on file     There were no vitals taken for this visit.  Physical Exam:  Well appearing NAD HEENT: Unremarkable Neck:  No JVD, no thyromegally Lymphatics:  No adenopathy Back:  No CVA tenderness Lungs:  Clear HEART:  Regular rate rhythm, no murmurs, no rubs, no clicks Abd:  soft, positive bowel sounds, no organomegally, no rebound, no guarding Ext:  2 plus pulses, no edema,  no cyanosis, no clubbing Skin:  No rashes no nodules Neuro:  CN II through XII intact, motor grossly intact  EKG - NSR  Assess/Plan: 1. PAF - she has had no recurrence. She had been on Eliquis. Her CHADSVASC score is 1. I have recommended stopping Eliquis. Low dose ASA would be an option. 2. Palpitations - she is symptomatic with he PAC's, PVC's and PAF. I have offered her flecainide. She is reflecting on this. She will continue low dose toprol. 3. Hypotension - she appears to have some autonomic dysfunction by her history. I have recommended she increase her salt and fluid intake as tolerated.   Carleene Overlie Taylor,MD

## 2020-08-20 ENCOUNTER — Other Ambulatory Visit (HOSPITAL_BASED_OUTPATIENT_CLINIC_OR_DEPARTMENT_OTHER): Payer: Self-pay

## 2020-09-11 ENCOUNTER — Other Ambulatory Visit (HOSPITAL_COMMUNITY): Payer: Self-pay

## 2020-09-11 ENCOUNTER — Other Ambulatory Visit (HOSPITAL_COMMUNITY): Payer: Self-pay | Admitting: Obstetrics and Gynecology

## 2020-09-11 MED FILL — Rosuvastatin Calcium Tab 5 MG: ORAL | 30 days supply | Qty: 30 | Fill #0 | Status: AC

## 2020-09-11 MED FILL — Bupropion HCl Tab ER 24HR 150 MG: ORAL | 30 days supply | Qty: 30 | Fill #0 | Status: AC

## 2020-09-19 ENCOUNTER — Other Ambulatory Visit (HOSPITAL_COMMUNITY): Payer: Self-pay

## 2020-09-19 MED ORDER — PROGESTERONE MICRONIZED 100 MG PO CAPS
ORAL_CAPSULE | Freq: Every day | ORAL | 3 refills | Status: DC
  Filled 2020-09-19: qty 30, 30d supply, fill #0
  Filled 2021-08-19: qty 30, 30d supply, fill #1

## 2020-09-20 ENCOUNTER — Other Ambulatory Visit (HOSPITAL_COMMUNITY): Payer: Self-pay

## 2020-09-21 ENCOUNTER — Other Ambulatory Visit (HOSPITAL_COMMUNITY): Payer: Self-pay

## 2020-10-02 ENCOUNTER — Other Ambulatory Visit (HOSPITAL_COMMUNITY): Payer: Self-pay

## 2020-10-02 MED ORDER — NYSTATIN-TRIAMCINOLONE 100000-0.1 UNIT/GM-% EX CREA
TOPICAL_CREAM | CUTANEOUS | 3 refills | Status: DC
  Filled 2020-10-02: qty 15, 7d supply, fill #0

## 2020-10-02 MED ORDER — PROGESTERONE MICRONIZED 100 MG PO CAPS
100.0000 mg | ORAL_CAPSULE | Freq: Every day | ORAL | 3 refills | Status: DC
Start: 2020-10-02 — End: 2021-08-16
  Filled 2020-10-02: qty 30, 30d supply, fill #0
  Filled 2020-11-19: qty 30, 30d supply, fill #1
  Filled 2020-12-14: qty 30, 30d supply, fill #2
  Filled 2021-01-14: qty 30, 30d supply, fill #3
  Filled 2021-02-08: qty 30, 30d supply, fill #4
  Filled 2021-03-08: qty 30, 30d supply, fill #5
  Filled 2021-04-15: qty 30, 30d supply, fill #6
  Filled 2021-05-19: qty 30, 30d supply, fill #7
  Filled 2021-06-17: qty 30, 30d supply, fill #8
  Filled 2021-07-22: qty 30, 30d supply, fill #9

## 2020-10-03 ENCOUNTER — Other Ambulatory Visit (HOSPITAL_COMMUNITY): Payer: Self-pay

## 2020-10-12 ENCOUNTER — Other Ambulatory Visit (HOSPITAL_COMMUNITY): Payer: Self-pay

## 2020-10-12 MED ORDER — BUPROPION HCL ER (XL) 150 MG PO TB24
150.0000 mg | ORAL_TABLET | Freq: Every morning | ORAL | 3 refills | Status: DC
  Filled 2020-10-12: qty 30, 30d supply, fill #0
  Filled 2020-11-19: qty 30, 30d supply, fill #1
  Filled 2020-12-19: qty 30, 30d supply, fill #2
  Filled 2021-01-14: qty 30, 30d supply, fill #3
  Filled 2021-02-08: qty 30, 30d supply, fill #4
  Filled 2021-03-08: qty 30, 30d supply, fill #5
  Filled 2021-04-15: qty 30, 30d supply, fill #6
  Filled 2021-05-19: qty 30, 30d supply, fill #7
  Filled 2021-06-17: qty 30, 30d supply, fill #8
  Filled 2021-07-22: qty 30, 30d supply, fill #9

## 2020-10-12 MED ORDER — FLUCONAZOLE 150 MG PO TABS
ORAL_TABLET | ORAL | 1 refills | Status: DC
  Filled 2020-10-12: qty 2, 4d supply, fill #0

## 2020-10-12 MED FILL — Rosuvastatin Calcium Tab 5 MG: ORAL | 30 days supply | Qty: 30 | Fill #1 | Status: AC

## 2020-10-12 MED FILL — Metoprolol Succinate Tab ER 24HR 25 MG (Tartrate Equiv): ORAL | 30 days supply | Qty: 30 | Fill #0 | Status: AC

## 2020-10-13 ENCOUNTER — Other Ambulatory Visit (HOSPITAL_COMMUNITY): Payer: Self-pay

## 2020-10-16 ENCOUNTER — Other Ambulatory Visit (HOSPITAL_COMMUNITY): Payer: Self-pay

## 2020-11-02 ENCOUNTER — Other Ambulatory Visit (HOSPITAL_COMMUNITY): Payer: Self-pay

## 2020-11-02 MED ORDER — CLOTRIMAZOLE-BETAMETHASONE 1-0.05 % EX CREA
TOPICAL_CREAM | CUTANEOUS | 1 refills | Status: DC
  Filled 2020-11-02: qty 30, 15d supply, fill #0

## 2020-11-05 ENCOUNTER — Other Ambulatory Visit (HOSPITAL_COMMUNITY): Payer: Self-pay

## 2020-11-19 ENCOUNTER — Other Ambulatory Visit (HOSPITAL_COMMUNITY): Payer: Self-pay

## 2020-11-19 MED FILL — Rosuvastatin Calcium Tab 5 MG: ORAL | 30 days supply | Qty: 30 | Fill #2 | Status: AC

## 2020-11-22 ENCOUNTER — Other Ambulatory Visit (HOSPITAL_COMMUNITY): Payer: Self-pay

## 2020-11-22 MED ORDER — CALCIPOTRIENE 0.005 % EX OINT
TOPICAL_OINTMENT | CUTANEOUS | 0 refills | Status: DC
  Filled 2020-11-22: qty 60, 10d supply, fill #0

## 2020-11-22 MED ORDER — TACROLIMUS 0.1 % EX OINT
TOPICAL_OINTMENT | CUTANEOUS | 0 refills | Status: DC
  Filled 2020-11-22: qty 30, 10d supply, fill #0

## 2020-11-23 ENCOUNTER — Other Ambulatory Visit (HOSPITAL_COMMUNITY): Payer: Self-pay

## 2020-12-14 ENCOUNTER — Other Ambulatory Visit (HOSPITAL_COMMUNITY): Payer: Self-pay

## 2020-12-14 MED ORDER — ROSUVASTATIN CALCIUM 5 MG PO TABS
ORAL_TABLET | ORAL | 11 refills | Status: DC
  Filled 2020-12-14: qty 30, 30d supply, fill #0
  Filled 2021-01-14: qty 30, 30d supply, fill #1
  Filled 2021-02-08: qty 30, 30d supply, fill #2
  Filled 2021-03-08: qty 30, 30d supply, fill #3
  Filled 2021-05-20 – 2021-05-30 (×2): qty 30, 30d supply, fill #4
  Filled 2021-06-17: qty 30, 30d supply, fill #5
  Filled 2021-07-22: qty 30, 30d supply, fill #6
  Filled 2021-11-27: qty 30, 30d supply, fill #7

## 2020-12-14 MED FILL — Metoprolol Succinate Tab ER 24HR 25 MG (Tartrate Equiv): ORAL | 30 days supply | Qty: 30 | Fill #1 | Status: AC

## 2020-12-17 ENCOUNTER — Other Ambulatory Visit (HOSPITAL_COMMUNITY): Payer: Self-pay

## 2020-12-19 ENCOUNTER — Other Ambulatory Visit (HOSPITAL_COMMUNITY): Payer: Self-pay

## 2021-01-14 ENCOUNTER — Other Ambulatory Visit (HOSPITAL_COMMUNITY): Payer: Self-pay

## 2021-02-08 ENCOUNTER — Other Ambulatory Visit (HOSPITAL_COMMUNITY): Payer: Self-pay

## 2021-02-08 MED FILL — Metoprolol Succinate Tab ER 24HR 25 MG (Tartrate Equiv): ORAL | 30 days supply | Qty: 30 | Fill #2 | Status: AC

## 2021-02-13 ENCOUNTER — Other Ambulatory Visit (HOSPITAL_COMMUNITY): Payer: Self-pay

## 2021-02-13 MED ORDER — MELOXICAM 7.5 MG PO TABS
ORAL_TABLET | ORAL | 0 refills | Status: DC
  Filled 2021-02-13 – 2021-02-14 (×2): qty 60, 30d supply, fill #0

## 2021-02-14 ENCOUNTER — Other Ambulatory Visit (HOSPITAL_COMMUNITY): Payer: Self-pay

## 2021-02-28 ENCOUNTER — Other Ambulatory Visit (HOSPITAL_COMMUNITY): Payer: Self-pay

## 2021-02-28 MED ORDER — PHENAZOPYRIDINE HCL 100 MG PO TABS
ORAL_TABLET | ORAL | 3 refills | Status: AC
  Filled 2021-02-28 (×2): qty 15, 5d supply, fill #0
  Filled 2021-11-27: qty 30, 10d supply, fill #1

## 2021-03-01 ENCOUNTER — Other Ambulatory Visit (HOSPITAL_COMMUNITY): Payer: Self-pay

## 2021-03-04 ENCOUNTER — Other Ambulatory Visit (HOSPITAL_COMMUNITY): Payer: Self-pay

## 2021-03-08 ENCOUNTER — Other Ambulatory Visit (HOSPITAL_COMMUNITY): Payer: Self-pay

## 2021-04-15 ENCOUNTER — Other Ambulatory Visit (HOSPITAL_COMMUNITY): Payer: Self-pay

## 2021-04-15 MED FILL — Metoprolol Succinate Tab ER 24HR 25 MG (Tartrate Equiv): ORAL | 30 days supply | Qty: 30 | Fill #3 | Status: AC

## 2021-05-19 ENCOUNTER — Other Ambulatory Visit (HOSPITAL_COMMUNITY): Payer: Self-pay

## 2021-05-20 ENCOUNTER — Other Ambulatory Visit (HOSPITAL_COMMUNITY): Payer: Self-pay

## 2021-05-30 ENCOUNTER — Other Ambulatory Visit (HOSPITAL_COMMUNITY): Payer: Self-pay

## 2021-06-17 ENCOUNTER — Other Ambulatory Visit: Payer: Self-pay | Admitting: Internal Medicine

## 2021-06-17 ENCOUNTER — Other Ambulatory Visit (HOSPITAL_COMMUNITY): Payer: Self-pay

## 2021-06-17 MED ORDER — METOPROLOL SUCCINATE ER 25 MG PO TB24
ORAL_TABLET | Freq: Every day | ORAL | 0 refills | Status: DC
  Filled 2021-06-17: qty 30, 30d supply, fill #0
  Filled 2021-08-19: qty 30, 30d supply, fill #1
  Filled 2021-10-20: qty 30, 30d supply, fill #2

## 2021-06-22 ENCOUNTER — Other Ambulatory Visit (HOSPITAL_COMMUNITY): Payer: Self-pay

## 2021-07-22 ENCOUNTER — Other Ambulatory Visit (HOSPITAL_COMMUNITY): Payer: Self-pay

## 2021-07-24 ENCOUNTER — Other Ambulatory Visit (HOSPITAL_COMMUNITY): Payer: Self-pay

## 2021-07-30 ENCOUNTER — Other Ambulatory Visit (HOSPITAL_COMMUNITY): Payer: Self-pay

## 2021-08-12 ENCOUNTER — Telehealth: Payer: Self-pay | Admitting: Internal Medicine

## 2021-08-12 NOTE — Telephone Encounter (Signed)
Feels she was in afib this weekend with a HR of 172 per her watch.  Started on 08/10/21 at 4:30pm and lasted until 08/11/21 around 9am. She says per Dr.Taylor she took a Xarelto '15mg'$  on 3/11 and Metoprolol 12.'5mg'$  and then at 10pm took another Metoprolol 12.'5mg'$  (HR 134) at this time.  She woke up at [place] on 3/12 and was still in afib, went back to sleep, when she woke up at 9 it was better. She feels she has been in NSR since but has felt winded and lightheaded.  She has lots stress at home and work and felt that this could be a contributing factor.  However, when she thinks back, the SOB with exertion started 08/02/21.  I let her know we would try to get her yearly appointment moved up from May. ?

## 2021-08-12 NOTE — Telephone Encounter (Signed)
? ?  Patient c/o Palpitations:  High priority if patient c/o lightheadedness, shortness of breath, or chest pain ? ?How long have you had palpitations/irregular HR/ Afib? Are you having the symptoms now? No  ? ?Are you currently experiencing lightheadedness, SOB or CP? SOB with exertion ? ?Do you have a history of afib (atrial fibrillation) or irregular heart rhythm? Yes  ? ?Have you checked your BP or HR? (document readings if available):  ? ?Are you experiencing any other symptoms?  ? ?Pt said this weekend she was in Afib and was back to sinus rhythm but she's been having SOB with exertion for 1 week to 10 days now and its getting worst. She wanted to speak with triage nurse ?

## 2021-08-13 NOTE — Telephone Encounter (Signed)
Spoke with patient Kristin Bradford ahd an opening for Friday 08/16/21 at 8:20 and she was happy to take that time. ?

## 2021-08-15 NOTE — Progress Notes (Signed)
? ?PCP:  Mayra Neer, MD ?Primary Cardiologist: Larae Grooms, MD ?Electrophysiologist: Cristopher Peru, MD  ? ?Kristin Bradford is a 66 y.o. female seen today for Cristopher Peru, MD for acute visit due to SOB and palpitations .   ? ? ?Pt reports mild SOB with exertion since the beginning of March. Feels she was in afib this weekend with a HR of 172 per her watch.  Started on 08/10/21 at 4:30pm and lasted until 08/11/21 around 9am. She says per Dr.Taylor she took a Xarelto '15mg'$  on 3/11 and Metoprolol 12.'5mg'$  and then at 10pm took another Metoprolol 12.'5mg'$  (HR 134) at this time.  She woke up at [place] on 3/12 and was still in afib, went back to sleep, when she woke up at 9 it was better.   Feels like she has been in NSR, but lightheadedness and mild fatigue has persisted.  ? ?Since last being seen in our clinic the patient reports doing OK up until the past 2-4 weeks.  Her ex husband has been living with her due to loss of job, an arrangement that was supposed to be for 1 month and has now stretched to > 6. She is under great stress at her job. 4 positions currently being covered by 2 people. No alcohol or tobacco abuse. Mild caffeine use. No overt chest pain. She has at times had jaw discomfort when her heart is racing. She has also had a non-specific fatigue and exertion over the past month. She usually walks about "3 miles" a day combined with her steps in clinic. Denies SOB with ADLs. ? ?Past Medical History:  ?Diagnosis Date  ? 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 Acquired deafness of left ear   ? S/P  RESECTION ACOUSTIC NEUROMA  2004  ? Acquired facial asymmetry   ? post surgery  ? Angio-edema   ? Atrial fibrillation ([place]) 05/18/2015  ? Fibroids 03/30/2019  ? GERD (gastroesophageal reflux disease)   ? Gestational diabetes mellitus   ? gestastional  ? Hyperlipidemia   ? Hypothyroidism   ? hx of no meds now  ? IC (interstitial cystitis)   ? Migraine   ? "q several months" (05/22/2015)  ? Neuromuscular disorder ([place])    ? tremor to right side when upset   ? PONV (postoperative nausea and vomiting)   ? atrial fib  ? Premature atrial contractions   ? PVC's (premature ventricular contractions)   ? Recurrent oral ulcers 06/30/2019  ? S/P myomectomy 03/30/2019  ? Seasonal and perennial allergic rhinitis 06/30/2019  ? SVT (supraventricular tachycardia) ([place])   ? Syncope   ? Urticaria   ? ?Past Surgical History:  ?Procedure Laterality Date  ? ACOUSTIC NEUROMA RESECTION Left 2004  ? BRAIN SURGERY    ? CATARACT EXTRACTION W/ INTRAOCULAR LENS IMPLANT Bilateral 01/2014  ? COLONOSCOPY  11/2014  ? CYSTO WITH HYDRODISTENSION N/A 05/18/2015  ? Procedure: CYSTOSCOPY/HYDRODISTENSION;  Surgeon: Carolan Clines, MD;  Location: [facility];  Service: Urology;  Laterality: N/A;  ? CYSTO WITH HYDRODISTENSION N/A 03/30/2019  ? Procedure: CYSTOSCOPY/HYDRODISTENSION;  Surgeon: Alexis Frock, MD;  Location: [facility];  Service: Urology;  Laterality: N/A;  ? CYSTO/  HYDRODISTENTION/  INSTILLATION THERAPY  x4  last one 05-16-2010  ? since 1990's  ? ESOPHAGOGASTRODUODENOSCOPY  11/2014  ? HEMANGIOMA EXCISION Left 2005  ?  FACIAL ANGIOMA REMOVAL / MASTOIDECTOMY  ? HEMORRHOID BANDING  05/2015  ? MASTOIDECTOMY Left 2005  ? LEFT FACIAL ANGIOMA REMOVAL /  LEFT MASTOIDECTOMY [Other]  ?  MYOMECTOMY N/A 03/30/2019  ? Procedure: ABDOMINAL MYOMECTOMY;  Surgeon: Dian Queen, MD;  Location: [facility];  Service: Gynecology;  Laterality: N/A;  ? TOTAL HIP ARTHROPLASTY Left 10/24/2016  ? Procedure: LEFT TOTAL HIP ARTHROPLASTY ANTERIOR APPROACH;  Surgeon: Mcarthur Rossetti, MD;  Location: [facility];  Service: Orthopedics;  Laterality: Left;  ? [place]  ? "all 4"  ? ? ?Current Outpatient Medications  ?Medication Sig Dispense Refill  ? ALPRAZolam (XANAX) 0.5 MG tablet Take 0.25 mg by mouth daily as needed for anxiety.   0  ? AZO-CRANBERRY PO Take 1 tablet by mouth daily.    ? buPROPion (WELLBUTRIN XL) 150 MG 24 hr tablet Take 150 mg by  mouth every morning.    ? calcipotriene (DOVONOX) 0.005 % ointment Apply to affected areas on the skin twice a day; Mix with Tacrolimus as directed by physician. 60 g 0  ? clotrimazole-betamethasone (LOTRISONE) cream apply 1 gram Topically twice daily 30 g 1  ? COLLAGEN PO Take 0.5 Scoops by mouth daily.    ? meloxicam (MOBIC) 7.5 MG tablet Take 7.5 mg by mouth as needed.     ? metoprolol succinate (TOPROL-XL) 25 MG 24 hr tablet TAKE 1 TABLET BY MOUTH ONCE A DAY--need appointment for further refills 90 tablet 0  ? metoprolol tartrate (LOPRESSOR) 25 MG tablet Take 12.5 mg by mouth daily as needed.    ? Multiple Vitamin (MULTIVITAMIN) tablet Take 1 tablet by mouth daily.    ? nystatin-triamcinolone (MYCOLOG II) cream Apply topically to the affected area twice a day in the morning and in the evening 15 g 3  ? phenazopyridine (PYRIDIUM) 100 MG tablet Take 1 tablet by mouth 3 times daily as needed 30 tablet 3  ? polyethylene glycol (MIRALAX / GLYCOLAX) packet Take 17 g by mouth daily.    ? progesterone (PROMETRIUM) 100 MG capsule TAKE 1 CAPSULE BY MOUTH ONCE A DAY 90 capsule 3  ? Rivaroxaban (XARELTO) 15 MG TABS tablet Take 15 mg by mouth as needed (afib).    ? rosuvastatin (CRESTOR) 5 MG tablet TAKE 1 TABLET BY MOUTH ONCE DAILY 30 tablet 11  ? ?No current facility-administered medications for this visit.  ? ?Facility-Administered Medications Ordered in Other Visits  ?Medication Dose Route Frequency Provider Last Rate Last Admin  ? bupivacaine (MARCAINE) 0.5 % 10 mL, triamcinolone acetonide (KENALOG-40) 40 mg injection   Subcutaneous Once Carolan Clines, MD      ? bupivacaine (MARCAINE) 0.5 % 15 mL, phenazopyridine (PYRIDIUM) 400 mg bladder mixture   Bladder Instillation Once Carolan Clines, MD      ? ? ?Allergies  ?Allergen Reactions  ? Fentanyl Nausea And Vomiting  ? Betadine [Povidone Iodine] Rash  ? Codeine Nausea And Vomiting  ? Other   ?  Steroid injections make her hair fall out  ? Penicillins Itching  ?   Has patient had a PCN reaction causing immediate rash, facial/tongue/throat swelling, SOB or lightheadedness with hypotension: Yes ?Has patient had a PCN reaction causing severe rash involving mucus membranes or skin necrosis: No ?Has patient had a PCN reaction that required hospitalization No ?Has patient had a PCN reaction occurring within the last 10 years: No ?If all of the above answers are "NO", then may proceed with Cephalosporin use. ?  ? Sulfa Antibiotics Swelling  ?  Lips  ? Tetanus Immune Globulin   ?  Other reaction(s): fever, vomiting  ? Tetanus Toxoids Nausea And Vomiting and Other (See Comments)  ?  High fever  ? ? ?Social History  ? ?Socioeconomic History  ? Marital status: Legally Separated  ?  Spouse name: Not on file  ? Number of children: Not on file  ? Years of education: Not on file  ? Highest education level: Not on file  ?Occupational History  ? Not on file  ?Tobacco Use  ? Smoking status: Never  ? Smokeless tobacco: Never  ?Vaping Use  ? Vaping Use: Never used  ?Substance and Sexual Activity  ? Alcohol use: Yes  ?  Alcohol/week: 1.0 standard drink  ?  Types: 1 Glasses of wine per week  ?  Comment: rare  ? Drug use: No  ? Sexual activity: Not Currently  ?  Birth control/protection: None  ?Other Topics Concern  ? Not on file  ?Social History Narrative  ? Not on file  ? ?Social Determinants of Health  ? ?Financial Resource Strain: Not on file  ?Food Insecurity: Not on file  ?Transportation Needs: Not on file  ?Physical Activity: Not on file  ?Stress: Not on file  ?Social Connections: Not on file  ?Intimate Partner Violence: Not on file  ? ?Review of Systems: ?All other systems reviewed and are otherwise negative except as noted above. ? ?Physical Exam: ?Vitals:  ? 08/16/21 0828  ?BP: 113/70  ?Pulse: (!) 55  ?SpO2: 99%  ?Weight: 203 lb (92.1 kg)  ?Height: '5\' 10"'$  (1.778 m)  ? ? ?GEN- The patient is well appearing, alert and oriented x 3 today.   ?HEENT: normocephalic, atraumatic; sclera clear,  conjunctiva pink; hearing intact; oropharynx clear; neck supple, no JVP ?Lymph- no cervical lymphadenopathy ?Lungs- Clear to ausculation bilaterally, normal work of breathing.  No wheezes, rales, rhonchi

## 2021-08-16 ENCOUNTER — Other Ambulatory Visit: Payer: Self-pay

## 2021-08-16 ENCOUNTER — Ambulatory Visit (INDEPENDENT_AMBULATORY_CARE_PROVIDER_SITE_OTHER): Payer: No Typology Code available for payment source | Admitting: Student

## 2021-08-16 ENCOUNTER — Encounter: Payer: Self-pay | Admitting: Student

## 2021-08-16 VITALS — BP 113/70 | HR 55 | Ht 70.0 in | Wt 203.0 lb

## 2021-08-16 DIAGNOSIS — I471 Supraventricular tachycardia: Secondary | ICD-10-CM | POA: Diagnosis not present

## 2021-08-16 DIAGNOSIS — I480 Paroxysmal atrial fibrillation: Secondary | ICD-10-CM | POA: Diagnosis not present

## 2021-08-16 DIAGNOSIS — I493 Ventricular premature depolarization: Secondary | ICD-10-CM | POA: Diagnosis not present

## 2021-08-16 LAB — BASIC METABOLIC PANEL
BUN/Creatinine Ratio: 15 (ref 12–28)
BUN: 15 mg/dL (ref 8–27)
CO2: 26 mmol/L (ref 20–29)
Calcium: 9.5 mg/dL (ref 8.7–10.3)
Chloride: 103 mmol/L (ref 96–106)
Creatinine, Ser: 0.99 mg/dL (ref 0.57–1.00)
Glucose: 106 mg/dL — ABNORMAL HIGH (ref 70–99)
Potassium: 4.1 mmol/L (ref 3.5–5.2)
Sodium: 141 mmol/L (ref 134–144)
eGFR: 64 mL/min/{1.73_m2} (ref >59)

## 2021-08-16 LAB — CBC
Hematocrit: 39.8 % (ref 34.0–46.6)
Hemoglobin: 13.1 g/dL (ref 11.1–15.9)
MCH: 29.2 pg (ref 26.6–33.0)
MCHC: 32.9 g/dL (ref 31.5–35.7)
MCV: 89 fL (ref 79–97)
Platelets: 275 10*3/uL (ref 150–450)
RBC: 4.49 x10E6/uL (ref 3.77–5.28)
RDW: 12.7 % (ref 11.7–15.4)
WBC: 5.1 10*3/uL (ref 3.4–10.8)

## 2021-08-16 LAB — TSH: TSH: 2.36 u[IU]/mL (ref 0.450–4.500)

## 2021-08-16 NOTE — Patient Instructions (Signed)
Medication Instructions:  ?Your physician recommends that you continue on your current medications as directed. Please refer to the Current Medication list given to you today. ? ?*If you need a refill on your cardiac medications before your next appointment, please call your pharmacy* ? ? ?Lab Work: ?TODAY: BMET, CBC, TSH ? ?If you have labs (blood work) drawn today and your tests are completely normal, you will receive your results only by: ?MyChart Message (if you have MyChart) OR ?A paper copy in the mail ?If you have any lab test that is abnormal or we need to change your treatment, we will call you to review the results. ? ? ?Testing/Procedures: ?Your physician has requested that you have an echocardiogram. Echocardiography is a painless test that uses sound waves to create images of your heart. It provides your doctor with information about the size and shape of your heart and how well your heart?s chambers and valves are working. This procedure takes approximately one hour. There are no restrictions for this procedure. ? ? ?Follow-Up: ?At [facility], you and your health needs are our priority.  As part of our continuing mission to provide you with exceptional heart care, we have created designated Provider Care Teams.  These Care Teams include your primary Cardiologist (physician) and Advanced Practice Providers (APPs -  Physician Assistants and Nurse Practitioners) who all work together to provide you with the care you need, when you need it. ? ?We recommend signing up for the patient portal called "MyChart".  Sign up information is provided on this After Visit Summary.  MyChart is used to connect with patients for Virtual Visits (Telemedicine).  Patients are able to view lab/test results, encounter notes, upcoming appointments, etc.  Non-urgent messages can be sent to your provider as well.   ?To learn more about what you can do with MyChart, go to [email or web address].   ? ?Your next appointment:    ?As scheduled ?

## 2021-08-19 ENCOUNTER — Other Ambulatory Visit (HOSPITAL_COMMUNITY): Payer: Self-pay

## 2021-08-20 ENCOUNTER — Other Ambulatory Visit (HOSPITAL_COMMUNITY): Payer: Self-pay

## 2021-08-20 MED ORDER — BUPROPION HCL ER (XL) 150 MG PO TB24
150.0000 mg | ORAL_TABLET | Freq: Every morning | ORAL | 0 refills | Status: DC
  Filled 2021-08-20: qty 30, 30d supply, fill #0
  Filled 2021-10-20: qty 30, 30d supply, fill #1
  Filled 2021-11-15: qty 30, 30d supply, fill #2

## 2021-09-06 ENCOUNTER — Ambulatory Visit (HOSPITAL_COMMUNITY): Payer: No Typology Code available for payment source | Attending: Cardiology

## 2021-09-06 DIAGNOSIS — I471 Supraventricular tachycardia: Secondary | ICD-10-CM | POA: Insufficient documentation

## 2021-09-06 DIAGNOSIS — I493 Ventricular premature depolarization: Secondary | ICD-10-CM | POA: Insufficient documentation

## 2021-09-06 DIAGNOSIS — I480 Paroxysmal atrial fibrillation: Secondary | ICD-10-CM | POA: Diagnosis present

## 2021-09-06 LAB — ECHOCARDIOGRAM COMPLETE
Area-P 1/2: 3.06 cm2
S' Lateral: 2.8 cm

## 2021-09-11 ENCOUNTER — Ambulatory Visit: Payer: PRIVATE HEALTH INSURANCE | Admitting: Student

## 2021-09-13 ENCOUNTER — Ambulatory Visit: Payer: No Typology Code available for payment source | Admitting: Student

## 2021-09-26 ENCOUNTER — Other Ambulatory Visit (HOSPITAL_COMMUNITY): Payer: Self-pay

## 2021-09-27 ENCOUNTER — Other Ambulatory Visit (HOSPITAL_COMMUNITY): Payer: Self-pay

## 2021-09-30 NOTE — Progress Notes (Signed)
PCP:  Lupita Raider, MD Primary Cardiologist: Lance Muss, MD Electrophysiologist: Lewayne Bunting, MD   Kristin Bradford is a 66 y.o. female seen today for Lewayne Bunting, MD for routine electrophysiology followup.  Since last being seen in our clinic the patient reports doing about the same. She has at times had jaw discomfort when her heart is racing. She has SOB and fatigue out of proportion to activity at times, coming on at times between 0.5 - 1 mile when before she was able to walk up to 3 miles without difficulty. She remains under increased stress.   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   Acquired deafness of left ear    S/P  RESECTION ACOUSTIC NEUROMA  2004   Acquired facial asymmetry    post surgery   Angio-edema    Atrial fibrillation ([facility]) 05/18/2015   Fibroids 03/30/2019   GERD (gastroesophageal reflux disease)    Gestational diabetes mellitus    gestastional   Hyperlipidemia    Hypothyroidism    hx of no meds now   IC (interstitial cystitis)    Migraine    "q several months" (05/22/2015)   Neuromuscular disorder ([facility])    tremor to right side when upset    PONV (postoperative nausea and vomiting)    atrial fib   Premature atrial contractions    PVC's (premature ventricular contractions)    Recurrent oral ulcers 06/30/2019   S/P myomectomy 03/30/2019   Seasonal and perennial allergic rhinitis 06/30/2019   SVT (supraventricular tachycardia) ([facility])    Syncope    Urticaria    Past Surgical History:  Procedure Laterality Date   ACOUSTIC NEUROMA RESECTION Left 2004   BRAIN SURGERY     CATARACT EXTRACTION W/ INTRAOCULAR LENS IMPLANT Bilateral 01/2014   COLONOSCOPY  11/2014   CYSTO WITH HYDRODISTENSION N/A 05/18/2015   Procedure: CYSTOSCOPY/HYDRODISTENSION;  Surgeon: Jethro Bolus, MD;  Location: [facility];  Service: Urology;  Laterality: N/A;   CYSTO WITH HYDRODISTENSION N/A 03/30/2019   Procedure:  CYSTOSCOPY/HYDRODISTENSION;  Surgeon: Sebastian Ache, MD;  Location: [facility];  Service: Urology;  Laterality: N/A;   CYSTO/  HYDRODISTENTION/  INSTILLATION THERAPY  x4  last one 05-16-2010   since 1990's   ESOPHAGOGASTRODUODENOSCOPY  11/2014   HEMANGIOMA EXCISION Left 2005    FACIAL ANGIOMA REMOVAL / MASTOIDECTOMY   HEMORRHOID BANDING  05/2015   MASTOIDECTOMY Left 2005   LEFT FACIAL ANGIOMA REMOVAL /  LEFT MASTOIDECTOMY [Other]   MYOMECTOMY N/A 03/30/2019   Procedure: ABDOMINAL MYOMECTOMY;  Surgeon: Marcelle Overlie, MD;  Location: [facility];  Service: Gynecology;  Laterality: N/A;   TOTAL HIP ARTHROPLASTY Left 10/24/2016   Procedure: LEFT TOTAL HIP ARTHROPLASTY ANTERIOR APPROACH;  Surgeon: Kathryne Hitch, MD;  Location: [facility];  Service: Orthopedics;  Laterality: Left;   WISDOM TOOTH EXTRACTION  1980   "all 4"    Current Outpatient Medications  Medication Sig Dispense Refill   ALPRAZolam (XANAX) 0.5 MG tablet Take 0.25 mg by mouth daily as needed for anxiety.   0   AZO-CRANBERRY PO Take 1 tablet by mouth daily.     buPROPion (WELLBUTRIN XL) 150 MG 24 hr tablet Take 1 tablet by mouth every morning. 90 tablet 0   COLLAGEN PO Take 0.5 Scoops by mouth daily.     conjugated estrogens (PREMARIN) vaginal cream See admin instructions.     meloxicam (MOBIC) 7.5 MG tablet Take 7.5 mg by mouth as needed.  metoprolol succinate (TOPROL-XL) 25 MG 24 hr tablet TAKE 1 TABLET BY MOUTH ONCE A DAY--need appointment for further refills 90 tablet 0   metoprolol tartrate (LOPRESSOR) 25 MG tablet Take 12.5 mg by mouth daily as needed.     Multiple Vitamin (MULTIVITAMIN) tablet Take 1 tablet by mouth daily.     phenazopyridine (PYRIDIUM) 100 MG tablet Take 1 tablet by mouth 3 times daily as needed 30 tablet 3   polyethylene glycol (MIRALAX / GLYCOLAX) packet Take 17 g by mouth daily.     progesterone (PROMETRIUM) 100 MG capsule TAKE 1 CAPSULE BY MOUTH ONCE A DAY 90 capsule 3   Rivaroxaban (XARELTO)  15 MG TABS tablet Take 15 mg by mouth as needed (afib).     rosuvastatin (CRESTOR) 5 MG tablet Take 1 tablet by mouth once a day 30 tablet 11   No current facility-administered medications for this visit.   Facility-Administered Medications Ordered in Other Visits  Medication Dose Route Frequency Provider Last Rate Last Admin   bupivacaine (MARCAINE) 0.5 % 10 mL, triamcinolone acetonide (KENALOG-40) 40 mg injection   Subcutaneous Once Jethro Bolus, MD       bupivacaine (MARCAINE) 0.5 % 15 mL, phenazopyridine (PYRIDIUM) 400 mg bladder mixture   Bladder Instillation Once Jethro Bolus, MD        Allergies  Allergen Reactions   Fentanyl Nausea And Vomiting   Betadine [Povidone Iodine] Rash   Codeine Nausea And Vomiting   Other     Steroid injections make her hair fall out   Penicillins Itching    Has patient had a PCN reaction causing immediate rash, facial/tongue/throat swelling, SOB or lightheadedness with hypotension: Yes Has patient had a PCN reaction causing severe rash involving mucus membranes or skin necrosis: No Has patient had a PCN reaction that required hospitalization No Has patient had a PCN reaction occurring within the last 10 years: No If all of the above answers are "NO", then may proceed with Cephalosporin use.    Sulfa Antibiotics Swelling    Lips   Tetanus Immune Globulin     Other reaction(s): fever, vomiting   Tetanus Toxoids Nausea And Vomiting and Other (See Comments)    High fever    Social History   Socioeconomic History   Marital status: Legally Separated    Spouse name: Not on file   Number of children: Not on file   Years of education: Not on file   Highest education level: Not on file  Occupational History   Not on file  Tobacco Use   Smoking status: Never   Smokeless tobacco: Never  Vaping Use   Vaping Use: Never used  Substance and Sexual Activity   Alcohol use: Yes    Alcohol/week: 1.0 standard drink    Types: 1 Glasses of  wine per week    Comment: rare   Drug use: No   Sexual activity: Not Currently    Birth control/protection: None  Other Topics Concern   Not on file  Social History Narrative   Not on file   Social Determinants of Health   Financial Resource Strain: Not on file  Food Insecurity: Not on file  Transportation Needs: Not on file  Physical Activity: Not on file  Stress: Not on file  Social Connections: Not on file  Intimate Partner Violence: Not on file     Review of Systems: All other systems reviewed and are otherwise negative except as noted above.  Physical Exam: Vitals:   10/07/21  1147  BP: 115/72  Pulse: 70  SpO2: 98%  Weight: 198 lb (89.8 kg)  Height: 5' 10.5" (1.791 m)    GEN- The patient is well appearing, alert and oriented x 3 today.   HEENT: normocephalic, atraumatic; sclera clear, conjunctiva pink; hearing intact; oropharynx clear; neck supple, no JVP Lymph- no cervical lymphadenopathy Lungs- Clear to ausculation bilaterally, normal work of breathing.  No wheezes, rales, rhonchi Heart- Regular rate and rhythm, no murmurs, rubs or gallops, PMI not laterally displaced GI- soft, non-tender, non-distended, bowel sounds present, no hepatosplenomegaly Extremities- no clubbing, cyanosis, or edema; DP/PT/radial pulses 2+ bilaterally MS- no significant deformity or atrophy Skin- warm and dry, no rash or lesion Psych- euthymic mood, full affect Neuro- strength and sensation are intact  EKG is not ordered.   Additional studies reviewed include: Previous EP office notes.   Assessment and Plan:  1. PAF She takes Xarelto prn for paroxysms of AF CHA2DS2/VASc is 1, and only for Female.  Previously discussed flecainide as daily or potentially as pill-In-pocket given level of medical literacy. Discussed with Dr. Ladona Ridgel and this would be reasonable.  She prefers to treat as less invasively as possible, even reserving medications for "necessity".  Echo WNL 09/06/2021.   With bradycardia, will not increase metoprolol yet. She reports profound hypotension and malaise with lower HRs.  She not willing for admission for sotalol/tikosyn.  Amiodarone poor option given young age.  She would prefer to avoid ablation consideration of possible   2. Palpitations Has previously been shown to have PACs, PVCs, and PAF Continue low dose toprol Consider flecainide as above.  Also offered multaq, but poor coverage    3. Hypotension Monitor closely with any med adjustments.    4. Fatigue/Malaise Labs today including TSH.  Normal Echo 09/06/2021. Will order coronary CTA for completeness/ischemic work up with low to moderate probability of CAD.   Follow up with Dr. Ladona Ridgel in 6 months, sooner if needed pending results.   Graciella Freer, PA-C  10/07/21 11:54 AM

## 2021-10-02 ENCOUNTER — Other Ambulatory Visit (HOSPITAL_COMMUNITY): Payer: Self-pay

## 2021-10-02 MED ORDER — ALPRAZOLAM 0.5 MG PO TABS
ORAL_TABLET | ORAL | 0 refills | Status: DC
  Filled 2021-10-02: qty 30, 15d supply, fill #0

## 2021-10-04 ENCOUNTER — Other Ambulatory Visit (HOSPITAL_COMMUNITY): Payer: Self-pay

## 2021-10-07 ENCOUNTER — Other Ambulatory Visit (HOSPITAL_COMMUNITY): Payer: Self-pay

## 2021-10-07 ENCOUNTER — Encounter: Payer: Self-pay | Admitting: Student

## 2021-10-07 ENCOUNTER — Ambulatory Visit (INDEPENDENT_AMBULATORY_CARE_PROVIDER_SITE_OTHER): Payer: No Typology Code available for payment source | Admitting: Student

## 2021-10-07 VITALS — BP 115/72 | HR 70 | Ht 70.5 in | Wt 198.0 lb

## 2021-10-07 DIAGNOSIS — I493 Ventricular premature depolarization: Secondary | ICD-10-CM | POA: Diagnosis not present

## 2021-10-07 DIAGNOSIS — I480 Paroxysmal atrial fibrillation: Secondary | ICD-10-CM

## 2021-10-07 DIAGNOSIS — I959 Hypotension, unspecified: Secondary | ICD-10-CM | POA: Diagnosis not present

## 2021-10-07 DIAGNOSIS — R0609 Other forms of dyspnea: Secondary | ICD-10-CM

## 2021-10-07 MED ORDER — PROGESTERONE MICRONIZED 100 MG PO CAPS
ORAL_CAPSULE | Freq: Every day | ORAL | 0 refills | Status: DC
  Filled 2021-10-07: qty 30, 30d supply, fill #0
  Filled 2021-11-04: qty 30, 30d supply, fill #1
  Filled 2021-11-27: qty 30, 30d supply, fill #2

## 2021-10-07 NOTE — Patient Instructions (Signed)
Medication Instructions:  ?Your physician recommends that you continue on your current medications as directed. Please refer to the Current Medication list given to you today. ? ?*If you need a refill on your cardiac medications before your next appointment, please call your pharmacy* ? ? ?Lab Work: ?TODAY: BMET ? ?If you have labs (blood work) drawn today and your tests are completely normal, you will receive your results only by: ?MyChart Message (if you have MyChart) OR ?A paper copy in the mail ?If you have any lab test that is abnormal or we need to change your treatment, we will call you to review the results. ? ? ?Testing/Procedures: ?Coronary CTA ? ? ?Follow-Up: ?At [facility], you and your health needs are our priority.  As part of our continuing mission to provide you with exceptional heart care, we have created designated Provider Care Teams.  These Care Teams include your primary Cardiologist (physician) and Advanced Practice Providers (APPs -  Physician Assistants and Nurse Practitioners) who all work together to provide you with the care you need, when you need it. ? ?We recommend signing up for the patient portal called "MyChart".  Sign up information is provided on this After Visit Summary.  MyChart is used to connect with patients for Virtual Visits (Telemedicine).  Patients are able to view lab/test results, encounter notes, upcoming appointments, etc.  Non-urgent messages can be sent to your provider as well.   ?To learn more about what you can do with MyChart, go to [email or web address].   ? ?Your next appointment:   ?6 month(s) ? ?The format for your next appointment:   ?In Person ? ?Provider:   ?Cristopher Peru, MD  ? ? ?Other Instructions ?See letter for CT Instructions ?

## 2021-10-15 ENCOUNTER — Other Ambulatory Visit (HOSPITAL_COMMUNITY): Payer: Self-pay

## 2021-10-15 MED ORDER — PROGESTERONE MICRONIZED 100 MG PO CAPS
100.0000 mg | ORAL_CAPSULE | Freq: Every day | ORAL | 3 refills | Status: DC
Start: 2021-10-15 — End: 2023-06-13
  Filled 2021-10-15: qty 90, 90d supply, fill #0
  Filled 2022-01-20: qty 30, 30d supply, fill #0
  Filled 2022-02-24: qty 30, 30d supply, fill #1
  Filled 2022-03-31: qty 30, 30d supply, fill #2
  Filled 2022-05-03: qty 30, 30d supply, fill #3
  Filled 2022-06-06: qty 30, 30d supply, fill #4
  Filled 2022-07-12: qty 30, 30d supply, fill #5
  Filled 2022-08-08: qty 30, 30d supply, fill #6

## 2021-10-15 MED ORDER — NYSTATIN-TRIAMCINOLONE 100000-0.1 UNIT/GM-% EX OINT
TOPICAL_OINTMENT | CUTANEOUS | 3 refills | Status: DC
  Filled 2021-10-15: qty 60, 30d supply, fill #0

## 2021-10-16 ENCOUNTER — Other Ambulatory Visit (HOSPITAL_COMMUNITY): Payer: Self-pay

## 2021-10-17 ENCOUNTER — Other Ambulatory Visit (HOSPITAL_COMMUNITY): Payer: Self-pay

## 2021-10-21 ENCOUNTER — Other Ambulatory Visit (HOSPITAL_COMMUNITY): Payer: Self-pay

## 2021-10-23 ENCOUNTER — Other Ambulatory Visit (HOSPITAL_COMMUNITY): Payer: Self-pay

## 2021-10-24 ENCOUNTER — Telehealth (HOSPITAL_COMMUNITY): Payer: Self-pay | Admitting: *Deleted

## 2021-10-24 ENCOUNTER — Other Ambulatory Visit (HOSPITAL_COMMUNITY): Payer: Self-pay

## 2021-10-24 ENCOUNTER — Other Ambulatory Visit: Payer: Self-pay | Admitting: Internal Medicine

## 2021-10-24 MED ORDER — METOPROLOL TARTRATE 25 MG PO TABS
ORAL_TABLET | ORAL | 0 refills | Status: DC
  Filled 2021-10-24: qty 1, 1d supply, fill #0
  Filled 2021-11-08: qty 1, 1d supply, fill #1

## 2021-10-24 NOTE — Telephone Encounter (Signed)
Patient calling regarding upcoming cardiac imaging study; pt verbalizes understanding of appt date/time, parking situation and where to check in, pre-test NPO status and medications ordered, and verified current allergies; name and call back number provided for further questions should they arise  Gordy Clement RN Navigator Cardiac Imaging [facility] [phone] office [phone] cell  Patient to take 12.5-'25mg'$  metoprolol tartrate two hours prior to her cardiac CT scan. She states she is out of her regular metoprolol tartrate. One pill sent in for the test. She is aware to arrive at 4pm.

## 2021-10-25 ENCOUNTER — Ambulatory Visit: Payer: PRIVATE HEALTH INSURANCE | Admitting: Internal Medicine

## 2021-10-29 ENCOUNTER — Ambulatory Visit (HOSPITAL_COMMUNITY)
Admission: RE | Admit: 2021-10-29 | Discharge: 2021-10-29 | Disposition: A | Discharge status: Home / Self Care | Payer: PRIVATE HEALTH INSURANCE | Source: Ambulatory Visit | Attending: Student | Admitting: Student

## 2021-10-29 ENCOUNTER — Encounter (HOSPITAL_COMMUNITY): Payer: Self-pay

## 2021-10-29 ENCOUNTER — Other Ambulatory Visit: Payer: Self-pay | Admitting: Cardiology

## 2021-10-29 DIAGNOSIS — R0609 Other forms of dyspnea: Secondary | ICD-10-CM | POA: Diagnosis present

## 2021-10-29 DIAGNOSIS — R931 Abnormal findings on diagnostic imaging of heart and coronary circulation: Secondary | ICD-10-CM | POA: Insufficient documentation

## 2021-10-29 DIAGNOSIS — I2510 Atherosclerotic heart disease of native coronary artery without angina pectoris: Secondary | ICD-10-CM | POA: Diagnosis not present

## 2021-10-29 DIAGNOSIS — I517 Cardiomegaly: Secondary | ICD-10-CM | POA: Insufficient documentation

## 2021-10-29 IMAGING — CT CT HEART MORP W/ CTA COR W/ SCORE W/ CA W/CM &/OR W/O CM
4 of 7 series · 8 of 20 positions shown, 9 images · IV contrast (APPLIED)
Comparison: None Available.
COMPARISON: None Available.

Addendum:
EXAM:
OVER-READ INTERPRETATION  CT CHEST

The following report is a limited chest CT over-read performed by
10/29/2021. This over-read does not include interpretation of cardiac
or coronary anatomy or pathology. The coronary CTA interpretation
by the cardiologist is attached.
CLINICAL DATA: 65F with JIM
Cardiac/Coronary CTA
TECHNIQUE: The patient was scanned on a [employer] Force scanner.

[Series 6: best diast · axial · 0.39mm/px · z∈[+1285,+1326]mm · 2 of 306 slices shown, 3 images]
[im 102/306  vessel]
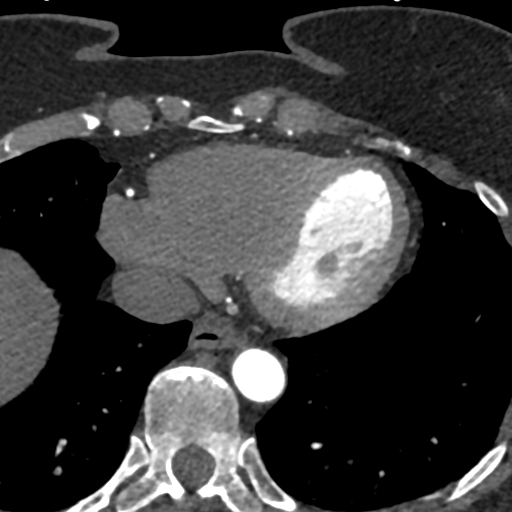
[im 102/306  lung]
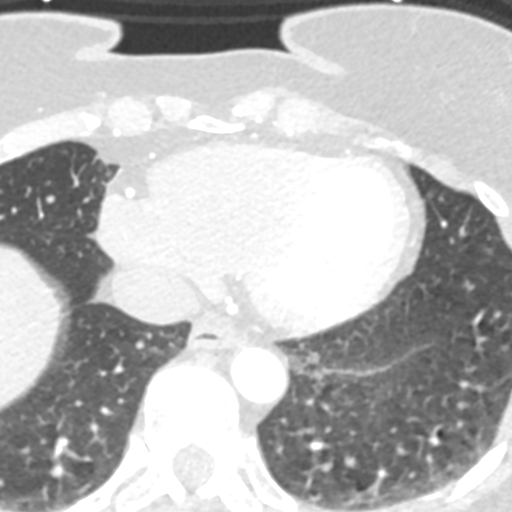
[im 204/306  vessel]
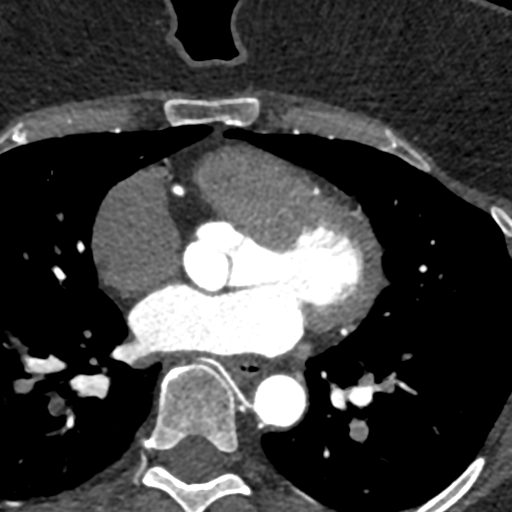

[Series 7: best syst · axial · 0.39mm/px · z∈[+1285,+1326]mm · 2 of 306 slices shown]
[im 102/306  vessel]
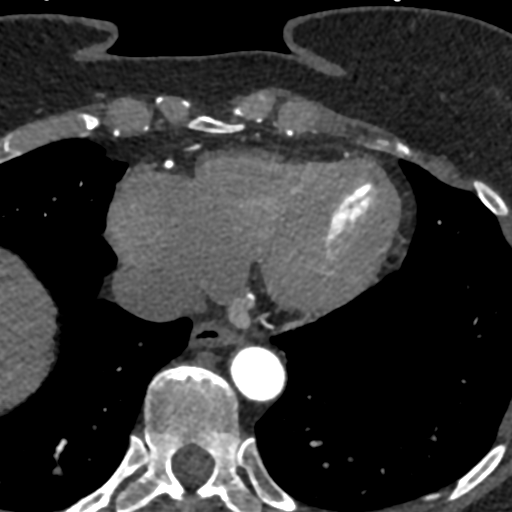
[im 204/306  vessel]
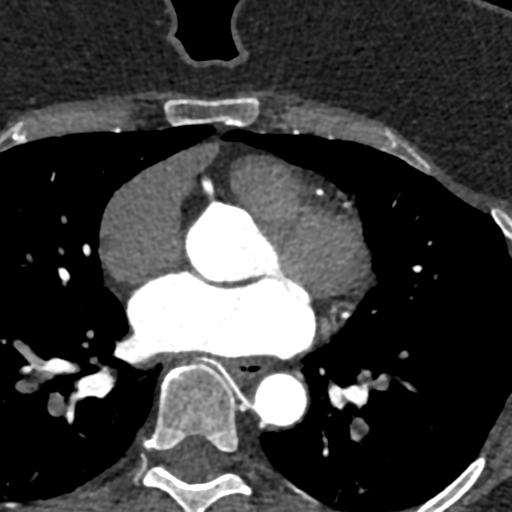

[Series 8: ts diast sharp · axial · 0.39mm/px · z∈[+1285,+1326]mm · 2 of 306 slices shown]
[im 102/306  lung]
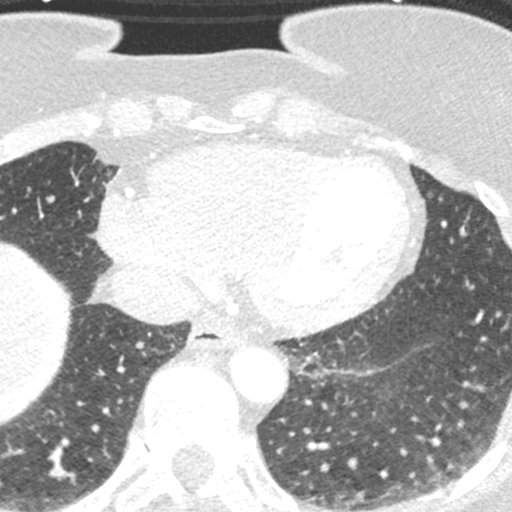
[im 204/306  lung]
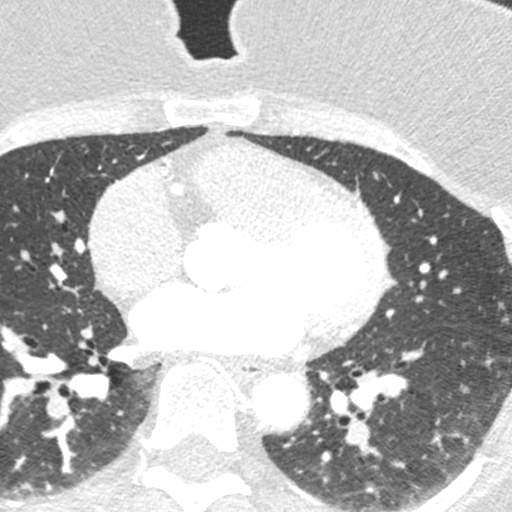

[Series 9: ts syst sharp · axial · 0.39mm/px · z∈[+1285,+1326]mm · 2 of 306 slices shown]
[im 102/306  lung]
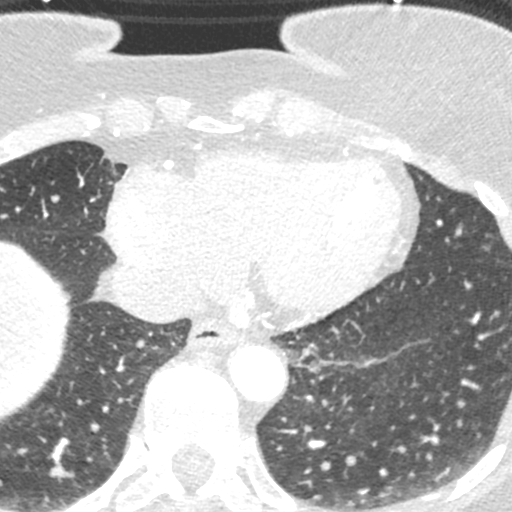
[im 204/306  lung]
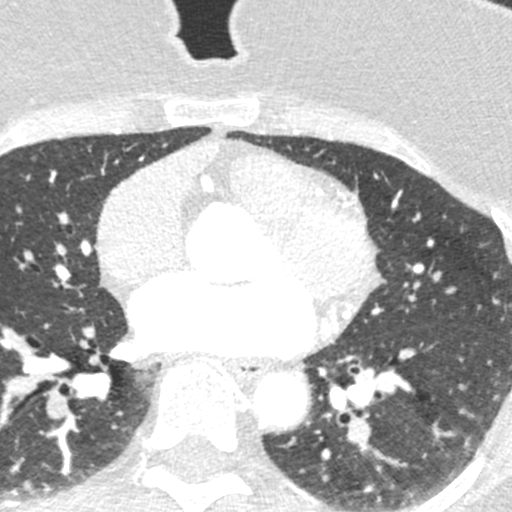

[8 of 20 positions shown; findings below may reference images not displayed]

FINDINGS: Vascular: Heart is normal size.  Aorta normal caliber.

Mediastinum/Nodes: No adenopathy

Lungs/Pleura: No confluent opacities or effusions.

Upper Abdomen: No acute findings in the upper abdomen.

Musculoskeletal: Chest wall soft tissues are unremarkable. No acute
bony abnormality.
IMPRESSION: No acute or significant extracardiac abnormality.
FINDINGS: A 100 kV prospective scan was triggered in the descending thoracic
aorta at 111 HU's. Axial non-contrast 3 mm slices were carried out
through the heart. The data set was analyzed on a dedicated work
station and scored using the Agatson method. Gantry rotation speed
was 250 msecs and collimation was .6 mm. 0.8 mg of sl NTG was given.
The 3D data set was reconstructed in 5% intervals of the 35-75% of
the R-R cycle. Phases were analyzed on a dedicated work station
using MPR, MIP and VRT modes. The patient received 80 cc of
contrast.

.

Coronary Arteries:  Normal coronary origin.  Right dominance.

RCA is a large dominant artery that gives rise to PDA and PLA. There
is no plaque.

Left main is a large artery that gives rise to LAD and LCX arteries.

LAD is a large vessel. Mixed plaque in proximal LAD causes ~50%
stenosis

LCX is a non-dominant artery that gives rise to one large OM1
branch. There is no plaque.

Other findings:

Left Ventricle: Normal size

Left Atrium: Normal size

Pulmonary Veins: Normal configuration

Right Ventricle: Mild enlargement

Right Atrium: Normal size

Cardiac valves: No calcifications

Thoracic aorta: Normal size

Pulmonary Arteries: Normal size

Systemic Veins: Normal drainage

Pericardium: Normal thickness
IMPRESSION: 1. Coronary calcium score of 42. This was 76th percentile for age
and sex matched control.

2. Normal coronary origin with right dominance.

3. Mixed plaque in proximal LAD causes ~50% stenosis. Will send
for CT FFR

CAD-RADS 3. Moderate stenosis. Consider symptom-guided anti-ischemic
pharmacotherapy as well as risk factor modification per guideline
directed care. Additional analysis with CT FFR will be submitted.

*** End of Addendum ***
EXAM:
OVER-READ INTERPRETATION  CT CHEST

The following report is a limited chest CT over-read performed by
10/29/2021. This over-read does not include interpretation of cardiac
or coronary anatomy or pathology. The coronary CTA interpretation
by the cardiologist is attached.
FINDINGS: Vascular: Heart is normal size.  Aorta normal caliber.

Mediastinum/Nodes: No adenopathy

Lungs/Pleura: No confluent opacities or effusions.

Upper Abdomen: No acute findings in the upper abdomen.

Musculoskeletal: Chest wall soft tissues are unremarkable. No acute
bony abnormality.
IMPRESSION: No acute or significant extracardiac abnormality.

## 2021-10-29 MED ORDER — NITROGLYCERIN 0.4 MG SL SUBL
0.8000 mg | SUBLINGUAL_TABLET | Freq: Once | SUBLINGUAL | Status: AC
  Administered 2021-10-29: 0.8 mg via SUBLINGUAL

## 2021-10-29 MED ORDER — NITROGLYCERIN 0.4 MG SL SUBL
SUBLINGUAL_TABLET | SUBLINGUAL | Status: AC
  Filled 2021-10-29: qty 2

## 2021-10-29 MED ORDER — IOHEXOL 350 MG/ML SOLN
100.0000 mL | Freq: Once | INTRAVENOUS | Status: AC | PRN
  Administered 2021-10-29: 100 mL via INTRAVENOUS

## 2021-10-30 ENCOUNTER — Ambulatory Visit (HOSPITAL_BASED_OUTPATIENT_CLINIC_OR_DEPARTMENT_OTHER)
Admission: RE | Admit: 2021-10-30 | Discharge: 2021-10-30 | Disposition: A | Discharge status: Home / Self Care | Payer: PRIVATE HEALTH INSURANCE | Source: Ambulatory Visit | Attending: Cardiology | Admitting: Cardiology

## 2021-10-30 ENCOUNTER — Ambulatory Visit (HOSPITAL_COMMUNITY)
Admission: RE | Admit: 2021-10-30 | Discharge: 2021-10-30 | Disposition: A | Discharge status: Home / Self Care | Payer: PRIVATE HEALTH INSURANCE | Source: Ambulatory Visit | Attending: Cardiology | Admitting: Cardiology

## 2021-10-30 DIAGNOSIS — R931 Abnormal findings on diagnostic imaging of heart and coronary circulation: Secondary | ICD-10-CM

## 2021-11-05 ENCOUNTER — Other Ambulatory Visit: Payer: Self-pay | Admitting: Internal Medicine

## 2021-11-05 ENCOUNTER — Other Ambulatory Visit (HOSPITAL_COMMUNITY): Payer: Self-pay

## 2021-11-08 ENCOUNTER — Other Ambulatory Visit: Payer: Self-pay | Admitting: Student

## 2021-11-08 ENCOUNTER — Other Ambulatory Visit (HOSPITAL_COMMUNITY): Payer: Self-pay

## 2021-11-08 MED ORDER — FLUCONAZOLE 150 MG PO TABS
ORAL_TABLET | ORAL | 1 refills | Status: DC
  Filled 2021-11-08: qty 2, 2d supply, fill #0

## 2021-11-11 ENCOUNTER — Other Ambulatory Visit: Payer: Self-pay

## 2021-11-11 ENCOUNTER — Other Ambulatory Visit (HOSPITAL_COMMUNITY): Payer: Self-pay

## 2021-11-11 MED ORDER — METOPROLOL TARTRATE 25 MG PO TABS
12.5000 mg | ORAL_TABLET | Freq: Every day | ORAL | 3 refills | Status: DC | PRN
  Filled 2021-11-11: qty 15, 30d supply, fill #0

## 2021-11-12 ENCOUNTER — Other Ambulatory Visit (HOSPITAL_COMMUNITY): Payer: Self-pay

## 2021-11-15 ENCOUNTER — Other Ambulatory Visit (HOSPITAL_COMMUNITY): Payer: Self-pay

## 2021-11-15 ENCOUNTER — Other Ambulatory Visit: Payer: Self-pay | Admitting: Internal Medicine

## 2021-11-19 ENCOUNTER — Other Ambulatory Visit (HOSPITAL_COMMUNITY): Payer: Self-pay

## 2021-11-19 MED ORDER — METOPROLOL SUCCINATE ER 25 MG PO TB24
ORAL_TABLET | Freq: Every day | ORAL | 3 refills | Status: DC
  Filled 2021-11-19: qty 30, 30d supply, fill #0
  Filled 2022-03-07: qty 30, 30d supply, fill #1
  Filled 2022-04-07: qty 30, 30d supply, fill #2
  Filled 2022-05-03: qty 30, 30d supply, fill #3
  Filled 2022-06-06: qty 30, 30d supply, fill #4
  Filled 2022-11-07 – 2022-11-13 (×3): qty 30, 30d supply, fill #5

## 2021-11-22 ENCOUNTER — Other Ambulatory Visit (HOSPITAL_COMMUNITY): Payer: Self-pay

## 2021-11-22 MED ORDER — NYSTATIN-TRIAMCINOLONE 100000-0.1 UNIT/GM-% EX CREA
TOPICAL_CREAM | CUTANEOUS | 3 refills | Status: DC
  Filled 2021-11-22: qty 60, 30d supply, fill #0

## 2021-11-22 MED ORDER — FLUCONAZOLE 150 MG PO TABS
ORAL_TABLET | ORAL | 1 refills | Status: DC
  Filled 2021-11-22: qty 2, 3d supply, fill #0
  Filled 2021-11-27: qty 2, 3d supply, fill #1

## 2021-11-25 ENCOUNTER — Other Ambulatory Visit (HOSPITAL_COMMUNITY): Payer: Self-pay

## 2021-11-27 ENCOUNTER — Other Ambulatory Visit (HOSPITAL_COMMUNITY): Payer: Self-pay

## 2021-11-27 MED ORDER — FLUCONAZOLE 100 MG PO TABS
ORAL_TABLET | ORAL | 1 refills | Status: DC
  Filled 2021-11-27: qty 7, 7d supply, fill #0

## 2021-11-28 ENCOUNTER — Other Ambulatory Visit (HOSPITAL_COMMUNITY): Payer: Self-pay

## 2021-12-30 ENCOUNTER — Other Ambulatory Visit (HOSPITAL_COMMUNITY): Payer: Self-pay

## 2021-12-31 ENCOUNTER — Other Ambulatory Visit (HOSPITAL_COMMUNITY): Payer: Self-pay

## 2021-12-31 MED ORDER — BUPROPION HCL ER (XL) 150 MG PO TB24
150.0000 mg | ORAL_TABLET | Freq: Every morning | ORAL | 1 refills | Status: DC
  Filled 2021-12-31: qty 30, 30d supply, fill #0
  Filled 2022-01-23: qty 30, 30d supply, fill #1
  Filled 2022-02-24: qty 30, 30d supply, fill #2
  Filled 2022-03-31: qty 30, 30d supply, fill #3
  Filled 2022-05-03: qty 30, 30d supply, fill #4
  Filled 2022-06-06: qty 30, 30d supply, fill #5

## 2022-01-19 ENCOUNTER — Other Ambulatory Visit (HOSPITAL_COMMUNITY): Payer: Self-pay

## 2022-01-20 ENCOUNTER — Other Ambulatory Visit (HOSPITAL_COMMUNITY): Payer: Self-pay

## 2022-01-23 ENCOUNTER — Other Ambulatory Visit (HOSPITAL_COMMUNITY): Payer: Self-pay

## 2022-02-24 ENCOUNTER — Other Ambulatory Visit (HOSPITAL_COMMUNITY): Payer: Self-pay

## 2022-02-25 ENCOUNTER — Other Ambulatory Visit (HOSPITAL_COMMUNITY): Payer: Self-pay

## 2022-03-08 ENCOUNTER — Other Ambulatory Visit (HOSPITAL_COMMUNITY): Payer: Self-pay

## 2022-03-10 ENCOUNTER — Other Ambulatory Visit (HOSPITAL_COMMUNITY): Payer: Self-pay

## 2022-03-13 ENCOUNTER — Other Ambulatory Visit (HOSPITAL_COMMUNITY): Payer: Self-pay

## 2022-03-31 ENCOUNTER — Other Ambulatory Visit (HOSPITAL_COMMUNITY): Payer: Self-pay

## 2022-04-02 DIAGNOSIS — D6869 Other thrombophilia: Secondary | ICD-10-CM | POA: Diagnosis not present

## 2022-04-02 DIAGNOSIS — I480 Paroxysmal atrial fibrillation: Secondary | ICD-10-CM | POA: Diagnosis not present

## 2022-04-02 DIAGNOSIS — E782 Mixed hyperlipidemia: Secondary | ICD-10-CM | POA: Diagnosis not present

## 2022-04-02 DIAGNOSIS — E039 Hypothyroidism, unspecified: Secondary | ICD-10-CM | POA: Diagnosis not present

## 2022-04-02 DIAGNOSIS — Z0000 Encounter for general adult medical examination without abnormal findings: Secondary | ICD-10-CM | POA: Diagnosis not present

## 2022-04-07 ENCOUNTER — Other Ambulatory Visit (HOSPITAL_COMMUNITY): Payer: Self-pay

## 2022-04-08 ENCOUNTER — Other Ambulatory Visit (HOSPITAL_COMMUNITY): Payer: Self-pay

## 2022-05-03 ENCOUNTER — Other Ambulatory Visit (HOSPITAL_COMMUNITY): Payer: Self-pay

## 2022-05-05 ENCOUNTER — Other Ambulatory Visit (HOSPITAL_COMMUNITY): Payer: Self-pay

## 2022-05-05 MED ORDER — ROSUVASTATIN CALCIUM 5 MG PO TABS
5.0000 mg | ORAL_TABLET | Freq: Every day | ORAL | 11 refills | Status: DC
  Filled 2022-05-05: qty 30, 30d supply, fill #0
  Filled 2022-06-06: qty 30, 30d supply, fill #1
  Filled 2022-09-13: qty 30, 30d supply, fill #2
  Filled 2022-10-16: qty 30, 30d supply, fill #3
  Filled 2022-11-08: qty 30, 30d supply, fill #4
  Filled 2022-12-11: qty 30, 30d supply, fill #5
  Filled 2023-01-12: qty 30, 30d supply, fill #6
  Filled 2023-02-16: qty 30, 30d supply, fill #7
  Filled 2023-03-16: qty 30, 30d supply, fill #8
  Filled 2023-04-22: qty 30, 30d supply, fill #9

## 2022-06-06 ENCOUNTER — Other Ambulatory Visit (HOSPITAL_COMMUNITY): Payer: Self-pay

## 2022-07-13 ENCOUNTER — Other Ambulatory Visit: Payer: Self-pay

## 2022-07-22 ENCOUNTER — Other Ambulatory Visit: Payer: Self-pay

## 2022-07-22 ENCOUNTER — Other Ambulatory Visit (HOSPITAL_COMMUNITY): Payer: Self-pay

## 2022-07-22 MED ORDER — BUPROPION HCL ER (XL) 150 MG PO TB24
150.0000 mg | ORAL_TABLET | Freq: Every morning | ORAL | 1 refills | Status: DC
  Filled 2022-07-22: qty 30, 30d supply, fill #0
  Filled 2022-10-02: qty 30, 30d supply, fill #1
  Filled 2022-11-13 (×2): qty 30, 30d supply, fill #2
  Filled 2022-12-11: qty 30, 30d supply, fill #3
  Filled 2023-01-12: qty 30, 30d supply, fill #4

## 2022-08-09 ENCOUNTER — Other Ambulatory Visit (HOSPITAL_COMMUNITY): Payer: Self-pay

## 2022-09-04 DIAGNOSIS — R102 Pelvic and perineal pain: Secondary | ICD-10-CM | POA: Diagnosis not present

## 2022-09-05 ENCOUNTER — Other Ambulatory Visit (HOSPITAL_COMMUNITY): Payer: Self-pay

## 2022-09-05 ENCOUNTER — Other Ambulatory Visit: Payer: Self-pay

## 2022-09-05 MED ORDER — PROGESTERONE MICRONIZED 100 MG PO CAPS
200.0000 mg | ORAL_CAPSULE | Freq: Every day | ORAL | 3 refills | Status: DC
  Filled 2022-09-05: qty 60, 30d supply, fill #0
  Filled 2022-10-06 – 2022-10-16 (×2): qty 60, 30d supply, fill #1
  Filled 2022-11-13 (×2): qty 60, 30d supply, fill #2
  Filled 2022-12-11: qty 60, 30d supply, fill #3
  Filled 2023-01-12: qty 60, 30d supply, fill #4
  Filled 2023-02-16: qty 60, 30d supply, fill #5
  Filled 2023-04-05: qty 60, 30d supply, fill #6

## 2022-09-14 ENCOUNTER — Other Ambulatory Visit (HOSPITAL_COMMUNITY): Payer: Self-pay

## 2022-09-23 DIAGNOSIS — H04123 Dry eye syndrome of bilateral lacrimal glands: Secondary | ICD-10-CM | POA: Diagnosis not present

## 2022-09-23 DIAGNOSIS — Z961 Presence of intraocular lens: Secondary | ICD-10-CM | POA: Diagnosis not present

## 2022-09-23 DIAGNOSIS — H524 Presbyopia: Secondary | ICD-10-CM | POA: Diagnosis not present

## 2022-10-03 ENCOUNTER — Other Ambulatory Visit (HOSPITAL_COMMUNITY): Payer: Self-pay

## 2022-10-07 ENCOUNTER — Other Ambulatory Visit: Payer: Self-pay

## 2022-10-07 ENCOUNTER — Encounter: Payer: Self-pay | Admitting: Pharmacist

## 2022-10-08 DIAGNOSIS — N83209 Unspecified ovarian cyst, unspecified side: Secondary | ICD-10-CM | POA: Diagnosis not present

## 2022-10-08 DIAGNOSIS — N390 Urinary tract infection, site not specified: Secondary | ICD-10-CM | POA: Diagnosis not present

## 2022-10-08 DIAGNOSIS — E782 Mixed hyperlipidemia: Secondary | ICD-10-CM | POA: Diagnosis not present

## 2022-10-08 DIAGNOSIS — E039 Hypothyroidism, unspecified: Secondary | ICD-10-CM | POA: Diagnosis not present

## 2022-10-09 ENCOUNTER — Other Ambulatory Visit: Payer: Self-pay

## 2022-10-09 ENCOUNTER — Other Ambulatory Visit (HOSPITAL_COMMUNITY): Payer: Self-pay

## 2022-10-09 MED ORDER — NITROFURANTOIN MONOHYD MACRO 100 MG PO CAPS
100.0000 mg | ORAL_CAPSULE | Freq: Two times a day (BID) | ORAL | 1 refills | Status: DC
Start: 2022-10-08 — End: 2023-05-22
  Filled 2022-10-09: qty 30, 15d supply, fill #0

## 2022-10-09 MED ORDER — PHENAZOPYRIDINE HCL 100 MG PO TABS
100.0000 mg | ORAL_TABLET | Freq: Three times a day (TID) | ORAL | 1 refills | Status: DC | PRN
  Filled 2022-10-09: qty 30, 10d supply, fill #0

## 2022-10-10 ENCOUNTER — Other Ambulatory Visit: Payer: Self-pay

## 2022-10-10 ENCOUNTER — Encounter: Payer: Self-pay | Admitting: Pharmacist

## 2022-10-13 ENCOUNTER — Other Ambulatory Visit (HOSPITAL_COMMUNITY): Payer: Self-pay

## 2022-10-13 ENCOUNTER — Other Ambulatory Visit (HOSPITAL_BASED_OUTPATIENT_CLINIC_OR_DEPARTMENT_OTHER): Payer: Self-pay

## 2022-10-16 ENCOUNTER — Other Ambulatory Visit (HOSPITAL_COMMUNITY): Payer: Self-pay

## 2022-10-21 DIAGNOSIS — N83202 Unspecified ovarian cyst, left side: Secondary | ICD-10-CM | POA: Diagnosis not present

## 2022-10-21 DIAGNOSIS — Z01419 Encounter for gynecological examination (general) (routine) without abnormal findings: Secondary | ICD-10-CM | POA: Diagnosis not present

## 2022-10-21 DIAGNOSIS — Z124 Encounter for screening for malignant neoplasm of cervix: Secondary | ICD-10-CM | POA: Diagnosis not present

## 2022-10-21 DIAGNOSIS — Z6827 Body mass index (BMI) 27.0-27.9, adult: Secondary | ICD-10-CM | POA: Diagnosis not present

## 2022-10-21 DIAGNOSIS — Z13820 Encounter for screening for osteoporosis: Secondary | ICD-10-CM | POA: Diagnosis not present

## 2022-10-21 DIAGNOSIS — Z1231 Encounter for screening mammogram for malignant neoplasm of breast: Secondary | ICD-10-CM | POA: Diagnosis not present

## 2022-10-22 ENCOUNTER — Other Ambulatory Visit (HOSPITAL_COMMUNITY): Payer: Self-pay

## 2022-10-22 ENCOUNTER — Other Ambulatory Visit: Payer: Self-pay

## 2022-10-22 MED ORDER — TERCONAZOLE 0.4 % VA CREA
TOPICAL_CREAM | VAGINAL | 1 refills | Status: DC
  Filled 2022-10-22: qty 45, 7d supply, fill #0

## 2022-10-22 MED ORDER — NYSTATIN-TRIAMCINOLONE 100000-0.1 UNIT/GM-% EX OINT
TOPICAL_OINTMENT | CUTANEOUS | 3 refills | Status: DC
  Filled 2022-10-22: qty 60, 30d supply, fill #0

## 2022-10-22 MED ORDER — TRAMADOL HCL 50 MG PO TABS: 50.0000 mg | ORAL_TABLET | Freq: Four times a day (QID) | ORAL | 0 refills | Status: DC

## 2022-11-08 ENCOUNTER — Other Ambulatory Visit (HOSPITAL_COMMUNITY): Payer: Self-pay

## 2022-11-10 ENCOUNTER — Other Ambulatory Visit: Payer: Self-pay

## 2022-11-13 ENCOUNTER — Other Ambulatory Visit (HOSPITAL_COMMUNITY): Payer: Self-pay

## 2022-11-13 ENCOUNTER — Encounter (HOSPITAL_COMMUNITY): Payer: Self-pay

## 2022-11-14 ENCOUNTER — Other Ambulatory Visit (HOSPITAL_COMMUNITY): Payer: Self-pay

## 2022-12-11 ENCOUNTER — Other Ambulatory Visit (HOSPITAL_COMMUNITY): Payer: Self-pay

## 2022-12-11 ENCOUNTER — Other Ambulatory Visit: Payer: Self-pay

## 2022-12-11 ENCOUNTER — Other Ambulatory Visit: Payer: Self-pay | Admitting: Internal Medicine

## 2022-12-11 MED ORDER — METOPROLOL SUCCINATE ER 25 MG PO TB24
25.0000 mg | ORAL_TABLET | Freq: Every day | ORAL | 0 refills | Status: DC
  Filled 2022-12-11: qty 30, 30d supply, fill #0

## 2022-12-15 DIAGNOSIS — N83202 Unspecified ovarian cyst, left side: Secondary | ICD-10-CM | POA: Diagnosis not present

## 2023-01-12 ENCOUNTER — Other Ambulatory Visit (HOSPITAL_COMMUNITY): Payer: Self-pay

## 2023-01-12 ENCOUNTER — Other Ambulatory Visit: Payer: Self-pay | Admitting: Internal Medicine

## 2023-01-13 ENCOUNTER — Other Ambulatory Visit (HOSPITAL_BASED_OUTPATIENT_CLINIC_OR_DEPARTMENT_OTHER): Payer: Self-pay

## 2023-01-13 ENCOUNTER — Other Ambulatory Visit (HOSPITAL_COMMUNITY): Payer: Self-pay

## 2023-02-17 ENCOUNTER — Other Ambulatory Visit: Payer: Self-pay

## 2023-02-20 DIAGNOSIS — E782 Mixed hyperlipidemia: Secondary | ICD-10-CM | POA: Diagnosis not present

## 2023-02-23 DIAGNOSIS — N83202 Unspecified ovarian cyst, left side: Secondary | ICD-10-CM | POA: Diagnosis not present

## 2023-02-23 DIAGNOSIS — H9192 Unspecified hearing loss, left ear: Secondary | ICD-10-CM | POA: Diagnosis not present

## 2023-02-25 ENCOUNTER — Other Ambulatory Visit: Payer: Self-pay | Admitting: Obstetrics and Gynecology

## 2023-02-25 DIAGNOSIS — N83202 Unspecified ovarian cyst, left side: Secondary | ICD-10-CM | POA: Diagnosis not present

## 2023-02-25 DIAGNOSIS — D271 Benign neoplasm of left ovary: Secondary | ICD-10-CM | POA: Diagnosis not present

## 2023-02-27 LAB — SURGICAL PATHOLOGY

## 2023-03-16 ENCOUNTER — Other Ambulatory Visit: Payer: Self-pay | Admitting: Internal Medicine

## 2023-03-17 ENCOUNTER — Other Ambulatory Visit: Payer: Self-pay

## 2023-03-18 ENCOUNTER — Other Ambulatory Visit: Payer: Self-pay

## 2023-03-18 ENCOUNTER — Other Ambulatory Visit (HOSPITAL_COMMUNITY): Payer: Self-pay

## 2023-03-18 MED ORDER — METOPROLOL SUCCINATE ER 25 MG PO TB24
25.0000 mg | ORAL_TABLET | Freq: Every day | ORAL | 0 refills | Status: DC
  Filled 2023-03-18: qty 15, 15d supply, fill #0

## 2023-03-24 ENCOUNTER — Telehealth: Payer: Self-pay | Admitting: Internal Medicine

## 2023-03-24 ENCOUNTER — Other Ambulatory Visit: Payer: Self-pay

## 2023-03-24 ENCOUNTER — Other Ambulatory Visit (HOSPITAL_COMMUNITY): Payer: Self-pay

## 2023-03-24 MED ORDER — METOPROLOL SUCCINATE ER 25 MG PO TB24
25.0000 mg | ORAL_TABLET | Freq: Every day | ORAL | 0 refills | Status: DC
  Filled 2023-03-24 – 2023-03-30 (×2): qty 30, 30d supply, fill #0

## 2023-03-24 NOTE — Telephone Encounter (Signed)
Pt's medication was sent to pt's pharmacy as requested. Confirmation received.  °

## 2023-03-24 NOTE — Telephone Encounter (Signed)
*  STAT* If patient is at the pharmacy, call can be transferred to refill team.   1. Which medications need to be refilled? (please list name of each medication and dose if known)  metoprolol succinate (TOPROL-XL) 25 MG 24 hr tablet  2. Which pharmacy/location (including street and city if local pharmacy) is medication to be sent to? [place] [facility]  3. Do they need a 30 day or 90 day supply?   90 day supply

## 2023-03-31 ENCOUNTER — Other Ambulatory Visit (HOSPITAL_COMMUNITY): Payer: Self-pay

## 2023-03-31 ENCOUNTER — Other Ambulatory Visit: Payer: Self-pay

## 2023-03-31 DIAGNOSIS — M7501 Adhesive capsulitis of right shoulder: Secondary | ICD-10-CM | POA: Diagnosis not present

## 2023-04-06 ENCOUNTER — Other Ambulatory Visit (HOSPITAL_COMMUNITY): Payer: Self-pay

## 2023-04-08 ENCOUNTER — Other Ambulatory Visit (HOSPITAL_COMMUNITY): Payer: Self-pay

## 2023-04-08 DIAGNOSIS — E039 Hypothyroidism, unspecified: Secondary | ICD-10-CM | POA: Diagnosis not present

## 2023-04-08 DIAGNOSIS — I480 Paroxysmal atrial fibrillation: Secondary | ICD-10-CM | POA: Diagnosis not present

## 2023-04-08 DIAGNOSIS — E782 Mixed hyperlipidemia: Secondary | ICD-10-CM | POA: Diagnosis not present

## 2023-04-08 DIAGNOSIS — D6869 Other thrombophilia: Secondary | ICD-10-CM | POA: Diagnosis not present

## 2023-04-08 DIAGNOSIS — Z0000 Encounter for general adult medical examination without abnormal findings: Secondary | ICD-10-CM | POA: Diagnosis not present

## 2023-04-08 MED ORDER — VENLAFAXINE HCL ER 37.5 MG PO CP24
37.5000 mg | ORAL_CAPSULE | Freq: Every day | ORAL | 3 refills | Status: DC
  Filled 2023-04-08: qty 30, 30d supply, fill #0
  Filled 2023-05-05: qty 30, 30d supply, fill #1
  Filled 2023-05-20: qty 30, 30d supply, fill #2

## 2023-04-08 MED ORDER — ROSUVASTATIN CALCIUM 10 MG PO TABS
10.0000 mg | ORAL_TABLET | Freq: Every day | ORAL | 3 refills | Status: DC
  Filled 2023-04-08: qty 30, 30d supply, fill #0

## 2023-04-09 ENCOUNTER — Other Ambulatory Visit: Payer: Self-pay

## 2023-04-10 ENCOUNTER — Other Ambulatory Visit: Payer: Self-pay | Admitting: Family Medicine

## 2023-04-10 DIAGNOSIS — Z86018 Personal history of other benign neoplasm: Secondary | ICD-10-CM

## 2023-04-22 ENCOUNTER — Other Ambulatory Visit: Payer: Self-pay

## 2023-04-22 ENCOUNTER — Ambulatory Visit: Payer: BC Managed Care – PPO | Attending: Student | Admitting: Student

## 2023-04-22 ENCOUNTER — Other Ambulatory Visit (HOSPITAL_COMMUNITY): Payer: Self-pay

## 2023-04-22 ENCOUNTER — Encounter: Payer: Self-pay | Admitting: Student

## 2023-04-22 VITALS — BP 116/70 | HR 79 | Ht 70.5 in | Wt 186.8 lb

## 2023-04-22 DIAGNOSIS — I471 Supraventricular tachycardia, unspecified: Secondary | ICD-10-CM

## 2023-04-22 DIAGNOSIS — I959 Hypotension, unspecified: Secondary | ICD-10-CM | POA: Diagnosis not present

## 2023-04-22 DIAGNOSIS — I480 Paroxysmal atrial fibrillation: Secondary | ICD-10-CM | POA: Diagnosis not present

## 2023-04-22 DIAGNOSIS — I493 Ventricular premature depolarization: Secondary | ICD-10-CM

## 2023-04-22 MED ORDER — RIVAROXABAN 15 MG PO TABS
15.0000 mg | ORAL_TABLET | ORAL | 0 refills | Status: DC | PRN
  Filled 2023-04-22: qty 30, 30d supply, fill #0

## 2023-04-22 NOTE — Progress Notes (Signed)
  Electrophysiology Office Note:   Date:  04/22/2023  ID:  Kristin Bradford, DOB 1955-11-06, MRN [id number]  Primary Cardiologist: Lance Muss, MD Electrophysiologist: Lewayne Bunting, MD      History of Present Illness:   Kristin Bradford is a 67 y.o. female with h/o PAF, GERD, PVCs, PACs, and migraines seen today for routine electrophysiology followup.   Since last being seen in our clinic the patient reports doing well overall. Has not had breakthrough AF. Frustrated she did not receive a call for her CT last year, appears results were forwarded through her MyChart. She denies chest pain, palpitations, dyspnea, PND, orthopnea, nausea, vomiting, dizziness, syncope, edema, weight gain, or early satiety.   Review of systems complete and found to be negative unless listed in HPI.   EP Information / Studies Reviewed:    EKG is ordered today. Personal review as below.  EKG Interpretation Date/Time:  Wednesday April 22 2023 11:29:37 EST Ventricular Rate:  79 PR Interval:  134 QRS Duration:  74 QT Interval:  376 QTC Calculation: 431 R Axis:   58  Text Interpretation: Normal sinus rhythm with sinus arrhythmia Nonspecific T wave abnormality When compared with ECG of 25-Apr-2017 11:10, Nonspecific T wave abnormality has replaced inverted T waves in Inferior leads Confirmed by Maxine Glenn [phone]) on 04/22/2023 11:35:19 AM    Ct Coronary 11/2021 1. Left Main: No significant stenosis 2. LAD: CTFFR 0.93 across lesion in proximal LAD 3. LCX: No significant stenosis 4. RCA: No significant stenosis  Echo 09/2021 LVEF 55-60%  Physical Exam:   VS:  BP 116/70   Pulse 79   Ht 5' 10.5" (1.791 m)   Wt 186 lb 12.8 oz (84.7 kg)   SpO2 96%   BMI 26.42 kg/m    Wt Readings from Last 3 Encounters:  04/22/23 186 lb 12.8 oz (84.7 kg)  10/07/21 198 lb (89.8 kg)  08/16/21 203 lb (92.1 kg)     GEN: Well nourished, well developed in no acute distress NECK: No JVD; No carotid bruits CARDIAC:  Regular rate and rhythm, no murmurs, rubs, gallops RESPIRATORY:  Clear to auscultation without rales, wheezing or rhonchi  ABDOMEN: Soft, non-tender, non-distended EXTREMITIES:  No edema; No deformity   ASSESSMENT AND PLAN:    Paroxysmal AF EKG today shows NSR Taking Xarelto as needed only for episode of AF, has not needed in over a year and a half. 08/2021 Echo with normal EF Prefers to avoid medications and procedures where possible  Palpitations Monitoring has shown PACs, PVCs, and PAF Continue low dose toprol.   HTN Has been low on occasion   Follow up with Dr. Elberta Fortis in 12 months to establish from Dr. Ladona Ridgel  Signed, Graciella Freer, PA-C

## 2023-04-22 NOTE — Patient Instructions (Signed)
 Medication Instructions:  Your physician recommends that you continue on your current medications as directed. Please refer to the Current Medication list given to you today.  *If you need a refill on your cardiac medications before your next appointment, please call your pharmacy*  Lab Work: None ordered If you have labs (blood work) drawn today and your tests are completely normal, you will receive your results only by: MyChart Message (if you have MyChart) OR A paper copy in the mail If you have any lab test that is abnormal or we need to change your treatment, we will call you to review the results.  Follow-Up: At [facility], you and your health needs are our priority.  As part of our continuing mission to provide you with exceptional heart care, we have created designated Provider Care Teams.  These Care Teams include your primary Cardiologist (physician) and Advanced Practice Providers (APPs -  Physician Assistants and Nurse Practitioners) who all work together to provide you with the care you need, when you need it.  Your next appointment:   1 year(s)  Provider:   Loman Brooklyn, MD only

## 2023-04-23 ENCOUNTER — Other Ambulatory Visit (HOSPITAL_COMMUNITY): Payer: Self-pay

## 2023-04-24 ENCOUNTER — Other Ambulatory Visit: Payer: Self-pay

## 2023-04-29 ENCOUNTER — Other Ambulatory Visit (HOSPITAL_COMMUNITY): Payer: Self-pay

## 2023-04-29 ENCOUNTER — Other Ambulatory Visit: Payer: Self-pay

## 2023-04-29 DIAGNOSIS — M7661 Achilles tendinitis, right leg: Secondary | ICD-10-CM | POA: Diagnosis not present

## 2023-04-29 MED ORDER — MELOXICAM 7.5 MG PO TABS
7.5000 mg | ORAL_TABLET | Freq: Two times a day (BID) | ORAL | 0 refills | Status: DC
  Filled 2023-04-29: qty 60, 30d supply, fill #0

## 2023-05-05 ENCOUNTER — Other Ambulatory Visit (HOSPITAL_COMMUNITY): Payer: Self-pay

## 2023-05-06 DIAGNOSIS — R1031 Right lower quadrant pain: Secondary | ICD-10-CM | POA: Diagnosis not present

## 2023-05-07 ENCOUNTER — Ambulatory Visit
Admission: RE | Admit: 2023-05-07 | Discharge: 2023-05-07 | Disposition: A | Discharge status: Home / Self Care | Payer: BC Managed Care – PPO | Source: Ambulatory Visit | Attending: Family Medicine | Admitting: Family Medicine

## 2023-05-07 DIAGNOSIS — Z86018 Personal history of other benign neoplasm: Secondary | ICD-10-CM

## 2023-05-07 DIAGNOSIS — R42 Dizziness and giddiness: Secondary | ICD-10-CM | POA: Diagnosis not present

## 2023-05-07 MED ORDER — GADOPICLENOL 0.5 MMOL/ML IV SOLN
9.0000 mL | Freq: Once | INTRAVENOUS | Status: AC | PRN
  Administered 2023-05-07: 9 mL via INTRAVENOUS

## 2023-05-20 ENCOUNTER — Other Ambulatory Visit (HOSPITAL_COMMUNITY): Payer: Self-pay

## 2023-05-20 DIAGNOSIS — M7501 Adhesive capsulitis of right shoulder: Secondary | ICD-10-CM | POA: Diagnosis not present

## 2023-05-20 DIAGNOSIS — M7661 Achilles tendinitis, right leg: Secondary | ICD-10-CM | POA: Diagnosis not present

## 2023-05-20 DIAGNOSIS — M7502 Adhesive capsulitis of left shoulder: Secondary | ICD-10-CM | POA: Diagnosis not present

## 2023-05-21 ENCOUNTER — Observation Stay (HOSPITAL_COMMUNITY)
Admission: EM | Admit: 2023-05-21 | Discharge: 2023-05-22 | Disposition: A | Discharge status: Home / Self Care | Payer: BC Managed Care – PPO | Attending: Internal Medicine | Admitting: Internal Medicine

## 2023-05-21 ENCOUNTER — Other Ambulatory Visit: Payer: Self-pay

## 2023-05-21 ENCOUNTER — Emergency Department (HOSPITAL_COMMUNITY): Payer: BC Managed Care – PPO

## 2023-05-21 ENCOUNTER — Encounter (HOSPITAL_COMMUNITY): Payer: Self-pay | Admitting: Family Medicine

## 2023-05-21 DIAGNOSIS — Z7901 Long term (current) use of anticoagulants: Secondary | ICD-10-CM | POA: Insufficient documentation

## 2023-05-21 DIAGNOSIS — R112 Nausea with vomiting, unspecified: Secondary | ICD-10-CM | POA: Diagnosis not present

## 2023-05-21 DIAGNOSIS — E039 Hypothyroidism, unspecified: Secondary | ICD-10-CM | POA: Diagnosis not present

## 2023-05-21 DIAGNOSIS — Z79899 Other long term (current) drug therapy: Secondary | ICD-10-CM | POA: Diagnosis not present

## 2023-05-21 DIAGNOSIS — I10 Essential (primary) hypertension: Secondary | ICD-10-CM | POA: Diagnosis not present

## 2023-05-21 DIAGNOSIS — Z96642 Presence of left artificial hip joint: Secondary | ICD-10-CM | POA: Insufficient documentation

## 2023-05-21 DIAGNOSIS — I4891 Unspecified atrial fibrillation: Principal | ICD-10-CM

## 2023-05-21 DIAGNOSIS — R0602 Shortness of breath: Secondary | ICD-10-CM | POA: Diagnosis not present

## 2023-05-21 DIAGNOSIS — I480 Paroxysmal atrial fibrillation: Principal | ICD-10-CM | POA: Insufficient documentation

## 2023-05-21 DIAGNOSIS — F419 Anxiety disorder, unspecified: Secondary | ICD-10-CM | POA: Diagnosis not present

## 2023-05-21 HISTORY — DX: Anxiety disorder, unspecified: F41.9

## 2023-05-21 LAB — URINALYSIS, ROUTINE W REFLEX MICROSCOPIC
Bilirubin Urine: NEGATIVE
Glucose, UA: NEGATIVE mg/dL
Hgb urine dipstick: NEGATIVE
Ketones, ur: 20 mg/dL — AB
Leukocytes,Ua: NEGATIVE
Nitrite: NEGATIVE
Protein, ur: NEGATIVE mg/dL
Specific Gravity, Urine: 1.008 (ref 1.005–1.030)
pH: 6 (ref 5.0–8.0)

## 2023-05-21 LAB — CBC WITH DIFFERENTIAL/PLATELET
Abs Immature Granulocytes: 0.03 10*3/uL (ref 0.00–0.07)
Basophils Absolute: 0.1 10*3/uL (ref 0.0–0.1)
Basophils Relative: 1 %
Eosinophils Absolute: 0.1 10*3/uL (ref 0.0–0.5)
Eosinophils Relative: 1 %
HCT: 44 % (ref 36.0–46.0)
Hemoglobin: 15.1 g/dL — ABNORMAL HIGH (ref 12.0–15.0)
Immature Granulocytes: 0 %
Lymphocytes Relative: 22 %
Lymphs Abs: 1.9 10*3/uL (ref 0.7–4.0)
MCH: 29.5 pg (ref 26.0–34.0)
MCHC: 34.3 g/dL (ref 30.0–36.0)
MCV: 86.1 fL (ref 80.0–100.0)
Monocytes Absolute: 0.7 10*3/uL (ref 0.1–1.0)
Monocytes Relative: 7 %
Neutro Abs: 6.2 10*3/uL (ref 1.7–7.7)
Neutrophils Relative %: 69 %
Platelets: 295 10*3/uL (ref 150–400)
RBC: 5.11 MIL/uL (ref 3.87–5.11)
RDW: 12.9 % (ref 11.5–15.5)
WBC: 9 10*3/uL (ref 4.0–10.5)
nRBC: 0 % (ref 0.0–0.2)

## 2023-05-21 LAB — MAGNESIUM: Magnesium: 2.1 mg/dL (ref 1.7–2.4)

## 2023-05-21 LAB — T4, FREE: Free T4: 0.83 ng/dL (ref 0.61–1.12)

## 2023-05-21 LAB — COMPREHENSIVE METABOLIC PANEL
ALT: 17 U/L (ref 0–44)
AST: 40 U/L (ref 15–41)
Albumin: 3.9 g/dL (ref 3.5–5.0)
Alkaline Phosphatase: 44 U/L (ref 38–126)
Anion gap: 15 (ref 5–15)
BUN: 14 mg/dL (ref 8–23)
CO2: 19 mmol/L — ABNORMAL LOW (ref 22–32)
Calcium: 9.5 mg/dL (ref 8.9–10.3)
Chloride: 104 mmol/L (ref 98–111)
Creatinine, Ser: 1.17 mg/dL — ABNORMAL HIGH (ref 0.44–1.00)
GFR, Estimated: 52 mL/min — ABNORMAL LOW (ref >60)
Glucose, Bld: 122 mg/dL — ABNORMAL HIGH (ref 70–99)
Potassium: 3.7 mmol/L (ref 3.5–5.1)
Sodium: 138 mmol/L (ref 135–145)
Total Bilirubin: 1 mg/dL (ref <1.2)
Total Protein: 7 g/dL (ref 6.5–8.1)

## 2023-05-21 LAB — HEPARIN LEVEL (UNFRACTIONATED): Heparin Unfractionated: >1.1 [IU]/mL — ABNORMAL HIGH (ref 0.30–0.70)

## 2023-05-21 LAB — APTT: aPTT: 47 s — ABNORMAL HIGH (ref 24–36)

## 2023-05-21 LAB — TSH: TSH: 3.524 u[IU]/mL (ref 0.350–4.500)

## 2023-05-21 MED ORDER — ALPRAZOLAM 0.25 MG PO TABS: 0.2500 mg | ORAL_TABLET | Freq: Two times a day (BID) | ORAL | Status: DC | PRN

## 2023-05-21 MED ORDER — METOPROLOL TARTRATE 25 MG PO TABS
50.0000 mg | ORAL_TABLET | Freq: Once | ORAL | Status: DC
  Administered 2023-05-21: 50 mg via ORAL
  Filled 2023-05-21: qty 2

## 2023-05-21 MED ORDER — CALCIUM GLUCONATE-NACL 1-0.675 GM/50ML-% IV SOLN
1.0000 g | Freq: Once | INTRAVENOUS | Status: AC
  Administered 2023-05-21: 1000 mg via INTRAVENOUS
  Filled 2023-05-21: qty 50

## 2023-05-21 MED ORDER — AMIODARONE HCL IN DEXTROSE 360-4.14 MG/200ML-% IV SOLN: 60.0000 mg/h | INTRAVENOUS | Status: DC

## 2023-05-21 MED ORDER — METOPROLOL TARTRATE 25 MG PO TABS
25.0000 mg | ORAL_TABLET | Freq: Four times a day (QID) | ORAL | Status: DC
  Administered 2023-05-22: 25 mg via ORAL
  Filled 2023-05-21 (×2): qty 1

## 2023-05-21 MED ORDER — AMIODARONE HCL IN DEXTROSE 360-4.14 MG/200ML-% IV SOLN: 30.0000 mg/h | INTRAVENOUS | Status: DC

## 2023-05-21 MED ORDER — AMIODARONE LOAD VIA INFUSION: 150.0000 mg | Freq: Once | INTRAVENOUS | Status: DC

## 2023-05-21 MED ORDER — HEPARIN (PORCINE) 25000 UT/250ML-% IV SOLN
950.0000 [IU]/h | INTRAVENOUS | Status: DC
  Administered 2023-05-21: 1200 [IU]/h via INTRAVENOUS
  Filled 2023-05-21: qty 250

## 2023-05-21 MED ORDER — SODIUM CHLORIDE 0.9 % IV BOLUS
1000.0000 mL | Freq: Once | INTRAVENOUS | Status: AC
  Administered 2023-05-21: 1000 mL via INTRAVENOUS

## 2023-05-21 MED ORDER — DILTIAZEM HCL-DEXTROSE 125-5 MG/125ML-% IV SOLN (PREMIX)
5.0000 mg/h | INTRAVENOUS | Status: DC
  Administered 2023-05-21: 5 mg/h via INTRAVENOUS
  Filled 2023-05-21: qty 125

## 2023-05-21 MED ORDER — METOPROLOL TARTRATE 5 MG/5ML IV SOLN: 5.0000 mg | INTRAVENOUS | Status: DC | PRN

## 2023-05-21 MED ORDER — METOPROLOL TARTRATE 25 MG PO TABS: 50.0000 mg | ORAL_TABLET | Freq: Once | ORAL | Status: DC

## 2023-05-21 MED ORDER — ONDANSETRON HCL 4 MG/2ML IJ SOLN
4.0000 mg | Freq: Four times a day (QID) | INTRAMUSCULAR | Status: DC | PRN
Start: 2023-05-21 — End: 2023-05-23
  Filled 2023-05-21: qty 2

## 2023-05-21 MED ORDER — ACETAMINOPHEN 325 MG PO TABS: 650.0000 mg | ORAL_TABLET | ORAL | Status: DC | PRN

## 2023-05-21 MED ORDER — DILTIAZEM HCL 25 MG/5ML IV SOLN
15.0000 mg | Freq: Once | INTRAVENOUS | Status: AC
  Administered 2023-05-21: 15 mg via INTRAVENOUS
  Filled 2023-05-21: qty 5

## 2023-05-21 MED ORDER — METOPROLOL TARTRATE 5 MG/5ML IV SOLN
5.0000 mg | Freq: Once | INTRAVENOUS | Status: AC
  Administered 2023-05-21: 5 mg via INTRAVENOUS
  Filled 2023-05-21: qty 5

## 2023-05-21 NOTE — H&P (Addendum)
History and Physical    Kristin Bradford:[id number] DOB: Jan 30, 1956 DOA: 05/21/2023  PCP: Lupita Raider, MD   Patient coming from: Home   Chief Complaint: Rapid HR, N/V, lightheadedness    HPI: Kristin Bradford is a 67 y.o. female with medical history significant for hypertension, GERD, anxiety, and PAF who presents with rapid heart rate, N/V, and lightheadedness.  Patient reports waking the past few nights with her heart rate in the 100 range.  She then developed nausea and vomiting last night and noted that her heart rate was 173.  Since then, she has been experiencing rapid palpitations, nausea, and lightheadedness.  She only uses Xarelto when she is in atrial fibrillation.  She took a dose of Xarelto and also Lopressor last night but did not have any improvement in her heart rate or symptoms.  ED Course: Upon arrival to the ED, patient is found to be tachycardic with systolic blood pressure in the 90s.  EKG demonstrates rapid atrial fibrillation.  She was given 15 mg IV diltiazem and started on diltiazem infusion without significant improvement in heart rate.  ED PA consulted cardiology who recommended stopping IV diltiazem and starting metoprolol 25 mg every 6 hours.  Review of Systems:  All other systems reviewed and apart from HPI, are negative.  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   Acquired deafness of left ear    S/P  RESECTION ACOUSTIC NEUROMA  2004   Acquired facial asymmetry    post surgery   Angio-edema    Atrial fibrillation (HCC) 05/18/2015   Fibroids 03/30/2019   GERD (gastroesophageal reflux disease)    Gestational diabetes mellitus    gestastional   Hyperlipidemia    Hypothyroidism    hx of no meds now   IC (interstitial cystitis)    Migraine    "q several months" (05/22/2015)   Neuromuscular disorder (HCC)    tremor to right side when upset    PONV (postoperative nausea and vomiting)    atrial fib   Premature  atrial contractions    PVC's (premature ventricular contractions)    Recurrent oral ulcers 06/30/2019   S/P myomectomy 03/30/2019   Seasonal and perennial allergic rhinitis 06/30/2019   SVT (supraventricular tachycardia) (HCC)    Syncope    Urticaria     Past Surgical History:  Procedure Laterality Date   ACOUSTIC NEUROMA RESECTION Left 2004   BRAIN SURGERY     CATARACT EXTRACTION W/ INTRAOCULAR LENS IMPLANT Bilateral 01/2014   COLONOSCOPY  11/2014   CYSTO WITH HYDRODISTENSION N/A 05/18/2015   Procedure: CYSTOSCOPY/HYDRODISTENSION;  Surgeon: Jethro Bolus, MD;  Location: [facility];  Service: Urology;  Laterality: N/A;   CYSTO WITH HYDRODISTENSION N/A 03/30/2019   Procedure: CYSTOSCOPY/HYDRODISTENSION;  Surgeon: Sebastian Ache, MD;  Location: [facility];  Service: Urology;  Laterality: N/A;   CYSTO/  HYDRODISTENTION/  INSTILLATION THERAPY  x4  last one 05-16-2010   since 1990's   ESOPHAGOGASTRODUODENOSCOPY  11/2014   HEMANGIOMA EXCISION Left 2005    FACIAL ANGIOMA REMOVAL / MASTOIDECTOMY   HEMORRHOID BANDING  05/2015   MASTOIDECTOMY Left 2005   LEFT FACIAL ANGIOMA REMOVAL /  LEFT MASTOIDECTOMY [Other]   MYOMECTOMY N/A 03/30/2019   Procedure: ABDOMINAL MYOMECTOMY;  Surgeon: Marcelle Overlie, MD;  Location: [facility];  Service: Gynecology;  Laterality: N/A;   TOTAL HIP ARTHROPLASTY Left 10/24/2016   Procedure: LEFT TOTAL HIP ARTHROPLASTY ANTERIOR APPROACH;  Surgeon: Kathryne Hitch, MD;  Location: [facility];  Service: Orthopedics;  Laterality: Left;   WISDOM TOOTH EXTRACTION  1980   "all 4"    Social History:   reports that she has never smoked. She has never used smokeless tobacco. She reports current alcohol use of about 1.0 standard drink of alcohol per week. She reports that she does not use drugs.  Allergies  Allergen Reactions   Fentanyl Nausea And Vomiting   Betadine [Povidone Iodine] Rash   Codeine Nausea And Vomiting   Other     Steroid injections  make her hair fall out   Penicillins Itching    Has patient had a PCN reaction causing immediate rash, facial/tongue/throat swelling, SOB or lightheadedness with hypotension: Yes Has patient had a PCN reaction causing severe rash involving mucus membranes or skin necrosis: No Has patient had a PCN reaction that required hospitalization No Has patient had a PCN reaction occurring within the last 10 years: No If all of the above answers are "NO", then may proceed with Cephalosporin use.    Sulfa Antibiotics Swelling    Lips   Tetanus Immune Globulin     Other reaction(s): fever, vomiting   Tetanus Toxoids Nausea And Vomiting and Other (See Comments)    High fever    Family History  Adopted: Yes     Prior to Admission medications   Medication Sig Start Date End Date Taking? Authorizing Provider  Polyethylene Glycol 400 (BLINK TEARS OP) Place 1 drop into both eyes daily as needed (For dry eyes).   Yes [provider]  ALPRAZolam Prudy Feeler) 0.5 MG tablet Take 0.25 mg by mouth daily as needed for anxiety.  07/23/16  Yes [provider]  COLLAGEN PO Take 0.5 Scoops by mouth daily.   Yes [provider]  conjugated estrogens (PREMARIN) vaginal cream See admin instructions. 10/02/21  Yes [provider]  meloxicam (MOBIC) 7.5 MG tablet Take 1 tablet (7.5 mg total) by mouth 2 (two) times daily for 2 weeks and then as needed. 04/29/23  Yes   metoprolol succinate (TOPROL-XL) 25 MG 24 hr tablet Take 1 tablet (25 mg total) by mouth daily. Patient must keep upcoming appointment in November with cardiologist before further refill. Thank you, Final attempt. Patient taking differently: Take 12.5 mg by mouth daily. Patient must keep upcoming appointment in November with cardiologist before further refill. Thank you, Final attempt. 03/24/23  Yes Marinus Maw, MD  metoprolol tartrate (LOPRESSOR) 25 MG tablet Take 1/2 tablet (12.5 mg total) by mouth daily as needed for  breakthrough palpitations Patient taking differently: Take 12.5 mg by mouth daily. 11/11/21  Yes Graciella Freer, PA-C  nitrofurantoin, macrocrystal-monohydrate, (MACROBID) 100 MG capsule Take 1 capsule (100 mg total) by mouth 2 (two) times daily for 7 days with food as needed for UTIs. Patient not taking: Reported on 05/21/2023 10/08/22     nystatin-triamcinolone (MYCOLOG II) cream APPLY TO THE AFFECTED AREAS 2 TIMES PER DAY IN THE MORNING AND EVENING Patient taking differently: Apply 1 Application topically daily as needed (for rash). 11/22/21  Yes   phenazopyridine (PYRIDIUM) 100 MG tablet Take 1 tablet by mouth 3 times daily as needed Patient taking differently: Take 100 mg by mouth 3 (three) times daily as needed for pain. 02/28/21  Yes   polyethylene glycol (MIRALAX / GLYCOLAX) packet Take 17 g by mouth daily.   Yes [provider]  progesterone (PROMETRIUM) 100 MG capsule TAKE 1 CAPSULE BY MOUTH ONCE A DAY 10/07/21 10/07/22  Marcelle Overlie, MD  progesterone (PROMETRIUM) 100 MG capsule TAKE 1 CAPSULE BY MOUTH DAILY 10/15/21  Yes   Rivaroxaban (XARELTO) 15 MG TABS tablet Take 1 tablet (15 mg total) by mouth as needed (afib). 04/22/23  Yes Graciella Freer, PA-C  rosuvastatin (CRESTOR) 5 MG tablet Take 1 tablet (5 mg total) by mouth daily. 05/05/22  Yes   venlafaxine XR (EFFEXOR-XR) 37.5 MG 24 hr capsule Take 1 capsule (37.5 mg total) by mouth daily with food. Patient not taking: Reported on 05/21/2023 04/08/23       Physical Exam: Vitals:   05/21/23 1900 05/21/23 1948 05/21/23 2015 05/21/23 2030  BP:  94/69 112/79 (!) 102/52  Pulse: (!) 141 (!) 55 85 (!) 151  Resp: (!) 27 (!) 22 (!) 29 (!) 29  SpO2: 100% 100% 99% 100%    Constitutional: NAD, no pallor or diaphoresis   Eyes: PERTLA, lids and conjunctivae normal ENMT: Mucous membranes are moist. Posterior pharynx clear of any exudate or lesions.   Neck: supple, no masses  Respiratory: no wheezing, no crackles. No  accessory muscle use.  Cardiovascular: Rate ~120 and irregularly irregular. No JVD.   Abdomen: No distension, no tenderness, soft. Bowel sounds active.  Musculoskeletal: no clubbing / cyanosis. No joint deformity upper and lower extremities.   Skin: no significant rashes, lesions, ulcers. Warm, dry, well-perfused. Neurologic: CN 2-12 grossly intact. Moving all extremities. Alert and oriented.  Psychiatric: Calm. Cooperative.    Labs and Imaging on Admission: I have personally reviewed following labs and imaging studies  CBC: Recent Labs  Lab 05/21/23 1855  WBC 9.0  NEUTROABS 6.2  HGB 15.1*  HCT 44.0  MCV 86.1  PLT 295   Basic Metabolic Panel: Recent Labs  Lab 05/21/23 1855  NA 138  K 3.7  CL 104  CO2 19*  GLUCOSE 122*  BUN 14  CREATININE 1.17*  CALCIUM 9.5  MG 2.1   GFR: CrCl cannot be calculated (Unknown ideal weight.). Liver Function Tests: Recent Labs  Lab 05/21/23 1855  AST 40  ALT 17  ALKPHOS 44  BILITOT 1.0  PROT 7.0  ALBUMIN 3.9   No results for input(s): "LIPASE", "AMYLASE" in the last 168 hours. No results for input(s): "AMMONIA" in the last 168 hours. Coagulation Profile: No results for input(s): "INR", "PROTIME" in the last 168 hours. Cardiac Enzymes: No results for input(s): "CKTOTAL", "CKMB", "CKMBINDEX", "TROPONINI" in the last 168 hours. BNP (last 3 results) No results for input(s): "PROBNP" in the last 8760 hours. HbA1C: No results for input(s): "HGBA1C" in the last 72 hours. CBG: No results for input(s): "GLUCAP" in the last 168 hours. Lipid Profile: No results for input(s): "CHOL", "HDL", "LDLCALC", "TRIG", "CHOLHDL", "LDLDIRECT" in the last 72 hours. Thyroid Function Tests: Recent Labs    05/21/23 1855  FREET4 0.83   Anemia Panel: No results for input(s): "VITAMINB12", "FOLATE", "FERRITIN", "TIBC", "IRON", "RETICCTPCT" in the last 72 hours. Urine analysis:    Component Value Date/Time   COLORURINE STRAW (A) 05/21/2023 2025    APPEARANCEUR CLEAR 05/21/2023 2025   LABSPEC 1.008 05/21/2023 2025   PHURINE 6.0 05/21/2023 2025   GLUCOSEU NEGATIVE 05/21/2023 2025   HGBUR NEGATIVE 05/21/2023 2025   BILIRUBINUR NEGATIVE 05/21/2023 2025   KETONESUR 20 (A) 05/21/2023 2025   PROTEINUR NEGATIVE 05/21/2023 2025   NITRITE NEGATIVE 05/21/2023 2025   LEUKOCYTESUR NEGATIVE 05/21/2023 2025   Sepsis Labs: @LABRCNTIP (procalcitonin:4,lacticidven:4) )No results found for this or any previous visit (from the past 240 hours).   Radiological Exams on Admission: No  results found.  EKG: Independently reviewed. Atrial fibrillation, rate 168.   Assessment/Plan   1. Atrial fibrillation with RVR  - Presents in rapid atrial fibrillation, did not improve with Lopressor at home or with IV diltiazem in ED    - Use IV heparin for now, schedule metoprolol 25 mg q6h per cardiology recommendations      2. Hypertension  - SBP 90s in ED   3. Anxiety  - Recently prescribed Effexor but has not yet started  - Continue low-dose Xanax as-needed     DVT prophylaxis: IV heparin  Code Status: Full   Level of Care: Level of care: Progressive Family Communication: Husband at bedside   Disposition Plan:  Patient is from: Home  Anticipated d/c is to: Home  Anticipated d/c date is: Possibly as early as 05/22/23  Patient currently: Pending rate-control  Consults called: ED PA discussed with cardiology  Admission status: Observation     Briscoe Deutscher, MD [facility]  05/21/2023, 9:00 PM

## 2023-05-21 NOTE — Progress Notes (Signed)
PHARMACY - ANTICOAGULATION CONSULT NOTE  Pharmacy Consult for heparin infusion Indication: atrial fibrillation  Allergies  Allergen Reactions   Fentanyl Nausea And Vomiting   Betadine [Povidone Iodine] Rash   Codeine Nausea And Vomiting   Other     Steroid injections make her hair fall out   Penicillins Itching    Has patient had a PCN reaction causing immediate rash, facial/tongue/throat swelling, SOB or lightheadedness with hypotension: Yes Has patient had a PCN reaction causing severe rash involving mucus membranes or skin necrosis: No Has patient had a PCN reaction that required hospitalization No Has patient had a PCN reaction occurring within the last 10 years: No If all of the above answers are "NO", then may proceed with Cephalosporin use.    Sulfa Antibiotics Swelling    Lips   Tetanus Immune Globulin     Other reaction(s): fever, vomiting   Tetanus Toxoids Nausea And Vomiting and Other (See Comments)    High fever    Patient Measurements: Height: 5' 10.5" (179.1 cm) Weight: 83.9 kg (185 lb) IBW/kg (Calculated) : 69.65 Heparin Dosing Weight: 83.9 kg  Vital Signs: Temp: 98.3 F (36.8 C) (12/19 2110) BP: 121/68 (12/19 2120) Pulse Rate: 151 (12/19 2120)  Labs: Recent Labs    05/21/23 1855  HGB 15.1*  HCT 44.0  PLT 295  CREATININE 1.17*    Estimated Creatinine Clearance: 57.1 mL/min (A) (by C-G formula based on SCr of 1.17 mg/dL (H)).   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   Acquired deafness of left ear    S/P  RESECTION ACOUSTIC NEUROMA  2004   Acquired facial asymmetry    post surgery   Angio-edema    Anxiety 05/21/2023   Atrial fibrillation (HCC) 05/18/2015   Fibroids 03/30/2019   GERD (gastroesophageal reflux disease)    Gestational diabetes mellitus    gestastional   Hyperlipidemia    Hypothyroidism    hx of no meds now   IC (interstitial cystitis)    Migraine    "q several months"  (05/22/2015)   Neuromuscular disorder (HCC)    tremor to right side when upset    PONV (postoperative nausea and vomiting)    atrial fib   Premature atrial contractions    PVC's (premature ventricular contractions)    Recurrent oral ulcers 06/30/2019   S/P myomectomy 03/30/2019   Seasonal and perennial allergic rhinitis 06/30/2019   SVT (supraventricular tachycardia) (HCC)    Syncope    Urticaria     Medications:  (Not in a hospital admission)   Assessment: 67 yo F presents to ED with rapid palpitations, nausea, and lightheadedness that started a few nights ago. She was found to be in Afib and has a history of paroxysmal Afib and takes metoprolol tartrate and Xarelto as needed. She took a dose of metoprolol tartrate 12.5mg  and rivaroxaban 20mg  x1 today at 12pm (pt had 20mg  rivaroxaban sample tablets from cardiology clinic and did not use prescription dose). Pt has not been taking Xarelto for Afib prior to today. Pharmacy consulted to dose heparin infusion for Afib.  Hgb 15.1, Plt 295 No s/sx of bleeding  Goal of Therapy:  Heparin level 0.3-0.7 units/ml Monitor platelets by anticoagulation protocol: Yes   Plan:  No heparin bolus given recent rivaroxaban administration Start heparin infusion at 1200 units/hr Check aPTT and heparin level in 6 hours Monitor daily CBC, aPTT and heparin levels until correlating (given recent rivaroxaban administration), and for s/sx  of bleeding F/u plans for oral anticoag per cardiology   Wilburn Cornelia, PharmD, BCPS Clinical Pharmacist 05/21/2023 10:04 PM   Please refer to [facility] for pharmacy phone number

## 2023-05-21 NOTE — ED Triage Notes (Signed)
Pt presents with tachycardia, hx of afib.  Dizziness.  Started today.  Took a metoprolol.  HR is 170s in triage.

## 2023-05-21 NOTE — ED Provider Triage Note (Signed)
Emergency Medicine Provider Triage Evaluation Note  Kristin Bradford , a 67 y.o. female  was evaluated in triage.  Pt complains of A-fib with RVR.  She feels like she is going to pass out.  She went into to an earlier date today.  She tried taking metoprolol.  She does not take Xarelto regularly but per "my protocol" she took a 20 mg Xarelto simultaneously..  Review of Systems  Positive: Racing heart Negative: Fever  Physical Exam  There were no vitals taken for this visit. Gen:   Awake, no distress   Resp:  Hyperventilating MSK:   Moves extremities without difficulty  Other:  That irregular heartbeat  Medical Decision Making  Medically screening exam initiated at 6:14 PM.  Appropriate orders placed.  Kristin Bradford was informed that the remainder of the evaluation will be completed by another provider, this initial triage assessment does not replace that evaluation, and the importance of remaining in the ED until their evaluation is complete.     Arthor Captain, PA-C 05/21/23 1825

## 2023-05-21 NOTE — ED Provider Notes (Signed)
[place] EMERGENCY DEPARTMENT AT [facility] Provider Note   CSN: [id number] Arrival date & time: 05/21/23  1807     History  Chief Complaint  Patient presents with   Tachycardia    Kristin Bradford is a 67 y.o. female, history of A-fib, cardiac arrest, hypothyroidism, who presents to the ED secondary to fast heart rate, has been going on for the last 3 days.  She states that suddenly started last 3 days, and she has been working more frequently, with long 12-hour days.  She denies any shortness of breath, or chest pain.  Notes that she is not having nausea, or vomiting, but does feel little bit dizzy.  Denies any weakness on one side of the body, or difficulty with speech.  Dizziness started yesterday, after she had some needling down on her left shoulder.  Since then, she has felt like her heart is been racing, and the dizziness has persisted.  Has only taken 1 dose of Xarelto, and has tried taking her Lopressor, without any relief.  Her cardiologist is Dr. Harley Hallmark and electrophysiologist is Dr. Ladona Ridgel.  Home Medications Prior to Admission medications   Medication Sig Start Date End Date Taking? Authorizing Provider  ALPRAZolam Prudy Feeler) 0.5 MG tablet Take 0.25 mg by mouth daily as needed for anxiety.  07/23/16  Yes [provider]  COLLAGEN PO Take 0.5 Scoops by mouth daily.   Yes [provider]  conjugated estrogens (PREMARIN) vaginal cream See admin instructions. 10/02/21  Yes [provider]  meloxicam (MOBIC) 7.5 MG tablet Take 1 tablet (7.5 mg total) by mouth 2 (two) times daily for 2 weeks and then as needed. 04/29/23  Yes   metoprolol succinate (TOPROL-XL) 25 MG 24 hr tablet Take 1 tablet (25 mg total) by mouth daily. Patient must keep upcoming appointment in November with cardiologist before further refill. Thank you, Final attempt. Patient taking differently: Take 12.5 mg by mouth daily. Patient must keep upcoming appointment in November with  cardiologist before further refill. Thank you, Final attempt. 03/24/23  Yes Marinus Maw, MD  metoprolol tartrate (LOPRESSOR) 25 MG tablet Take 1/2 tablet (12.5 mg total) by mouth daily as needed for breakthrough palpitations Patient taking differently: Take 12.5 mg by mouth daily. 11/11/21  Yes Graciella Freer, PA-C  nystatin-triamcinolone (MYCOLOG II) cream APPLY TO THE AFFECTED AREAS 2 TIMES PER DAY IN THE MORNING AND EVENING Patient taking differently: Apply 1 Application topically daily as needed (for rash). 11/22/21  Yes   phenazopyridine (PYRIDIUM) 100 MG tablet Take 1 tablet by mouth 3 times daily as needed Patient taking differently: Take 100 mg by mouth 3 (three) times daily as needed for pain. 02/28/21  Yes   polyethylene glycol (MIRALAX / GLYCOLAX) packet Take 17 g by mouth daily.   Yes [provider]  Polyethylene Glycol 400 (BLINK TEARS OP) Place 1 drop into both eyes daily as needed (For dry eyes).   Yes [provider]  progesterone (PROMETRIUM) 100 MG capsule TAKE 1 CAPSULE BY MOUTH DAILY 10/15/21  Yes   Rivaroxaban (XARELTO) 15 MG TABS tablet Take 1 tablet (15 mg total) by mouth as needed (afib). 04/22/23  Yes Graciella Freer, PA-C  rosuvastatin (CRESTOR) 5 MG tablet Take 1 tablet (5 mg total) by mouth daily. 05/05/22  Yes   nitrofurantoin, macrocrystal-monohydrate, (MACROBID) 100 MG capsule Take 1 capsule (100 mg total) by mouth 2 (two) times daily for 7 days with food as needed for UTIs. Patient not  taking: Reported on 05/21/2023 10/08/22     progesterone (PROMETRIUM) 100 MG capsule TAKE 1 CAPSULE BY MOUTH ONCE A DAY 10/07/21 10/07/22  Marcelle Overlie, MD  venlafaxine XR (EFFEXOR-XR) 37.5 MG 24 hr capsule Take 1 capsule (37.5 mg total) by mouth daily with food. Patient not taking: Reported on 05/21/2023 04/08/23         Allergies    Fentanyl, Betadine [povidone iodine], Codeine, Other, Penicillins, Sulfa antibiotics, Tetanus immune globulin, and  Tetanus toxoids    Review of Systems   Review of Systems  Cardiovascular:  Positive for palpitations. Negative for chest pain.    Physical Exam Updated Vital Signs BP 121/68   Pulse (!) 151   Temp 98.3 F (36.8 C)   Resp 16   SpO2 100%  Physical Exam Vitals and nursing note reviewed.  Constitutional:      General: She is not in acute distress.    Appearance: She is well-developed.  HENT:     Head: Normocephalic and atraumatic.  Eyes:     Conjunctiva/sclera: Conjunctivae normal.  Cardiovascular:     Rate and Rhythm: Tachycardia present. Rhythm irregular.     Heart sounds: No murmur heard. Pulmonary:     Effort: Pulmonary effort is normal. No respiratory distress.     Breath sounds: Normal breath sounds.  Abdominal:     Palpations: Abdomen is soft.     Tenderness: There is no abdominal tenderness.  Musculoskeletal:        General: No swelling.     Cervical back: Neck supple.  Skin:    General: Skin is warm and dry.     Capillary Refill: Capillary refill takes less than 2 seconds.  Neurological:     General: No focal deficit present.     Mental Status: She is alert and oriented to person, place, and time.     Cranial Nerves: No cranial nerve deficit.     Sensory: No sensory deficit.     Motor: No weakness.  Psychiatric:        Mood and Affect: Mood normal.     ED Results / Procedures / Treatments   Labs (all labs ordered are listed, but only abnormal results are displayed) Labs Reviewed  CBC WITH DIFFERENTIAL/PLATELET - Abnormal; Notable for the following components:      Result Value   Hemoglobin 15.1 (*)    All other components within normal limits  COMPREHENSIVE METABOLIC PANEL - Abnormal; Notable for the following components:   CO2 19 (*)    Glucose, Bld 122 (*)    Creatinine, Ser 1.17 (*)    GFR, Estimated 52 (*)    All other components within normal limits  URINALYSIS, ROUTINE W REFLEX MICROSCOPIC - Abnormal; Notable for the following components:    Color, Urine STRAW (*)    Ketones, ur 20 (*)    All other components within normal limits  MAGNESIUM  TSH  T4, FREE  HIV ANTIBODY (ROUTINE TESTING W REFLEX)  BASIC METABOLIC PANEL  CBC    EKG None  Radiology No results found.  Procedures Procedures    Medications Ordered in ED Medications  acetaminophen (TYLENOL) tablet 650 mg (has no administration in time range)  ondansetron (ZOFRAN) injection 4 mg (has no administration in time range)  ALPRAZolam (XANAX) tablet 0.25 mg (has no administration in time range)  metoprolol tartrate (LOPRESSOR) injection 5 mg (has no administration in time range)  diltiazem (CARDIZEM) injection 15 mg (15 mg Intravenous Given 05/21/23 1839)  sodium  chloride 0.9 % bolus 1,000 mL (1,000 mLs Intravenous New Bag/Given 05/21/23 2118)  metoprolol tartrate (LOPRESSOR) injection 5 mg (5 mg Intravenous Given 05/21/23 2133)    ED Course/ Medical Decision Making/ A&P                                 Medical Decision Making Patient is a 67 year old female, here for fast heart rate, it has been gone for the last 3 days.  She has been doing her A-fib protocol at home, without any relief.  She is only taken 1 dose of Xarelto.  She is found to be in A-fib RVR currently.  We will obtain electrolytes, blood work for further evaluation.  In trial a Cardizem bolus to see if she reconverts, her heart rate is in the 120s to 140s  Amount and/or Complexity of Data Reviewed Labs: ordered.    Details: Labs unremarkable Radiology: ordered.    Details: Chest x-ray clear Discussion of management or test interpretation with external provider(s): Discussed with Dr. Antionette Char, patient refractory, to bolus of diltiazem, still symptomatic, thus started on IV drip, of diltiazem, still tachycardic, but symptomatically is feeling better.  Dr. Antionette Char request that I speak to cardiology on further management.  I spoke with Dr. Noemi Chapel, who suggests starting metoprolol 25 mg every 6  hours and taking patient off diltiazem drip.  Dr. Andria Meuse, recommends a few boluses of IV metoprolol, this was placed, with parameters for nursing to monitor.  Dr. Antionette Char to further manage inpatient wise.  Patient unable to be cardioverted, secondary to not being on anticoag's regularly, as well as extended time in A-fib.  On my reeval, patient states she is not nearly as dizzy, and she is improving. No neurodeficits noted.   Risk Prescription drug management. Decision regarding hospitalization.   Final Clinical Impression(s) / ED Diagnoses Final diagnoses:  Atrial fibrillation with RVR [facility])    Rx / DC Orders ED Discharge Orders          Ordered    Amb referral to AFIB Clinic        05/21/23 2100              Arnecia Ector, Harley Alto, PA 05/21/23 2144    Anders Simmonds T, DO 05/22/23 1722

## 2023-05-21 NOTE — ED Notes (Signed)
This nurse took the patient' blood pressure which was 94/69 and heart rate was 138. Following the order parameters no changes was made to the rate. Gildardo Griffes DO was contacted over decision and ordered for rate to remain at 5 mL/hr.

## 2023-05-21 NOTE — ED Notes (Signed)
No changes made to titration. Order parameters not met. 102/52 BP

## 2023-05-21 NOTE — ED Notes (Addendum)
[name] MD was updated regarding patient's low blood pressure and heart rate remaining elevated. Opyd MD stated to contact Dr. Dow Adolph with further patient concerns and to monitor MAPs so they are 65 or higher.

## 2023-05-22 ENCOUNTER — Other Ambulatory Visit: Payer: Self-pay

## 2023-05-22 ENCOUNTER — Encounter (HOSPITAL_COMMUNITY): Payer: Self-pay | Admitting: Family Medicine

## 2023-05-22 DIAGNOSIS — I159 Secondary hypertension, unspecified: Secondary | ICD-10-CM | POA: Diagnosis not present

## 2023-05-22 DIAGNOSIS — F419 Anxiety disorder, unspecified: Secondary | ICD-10-CM | POA: Diagnosis not present

## 2023-05-22 DIAGNOSIS — I480 Paroxysmal atrial fibrillation: Secondary | ICD-10-CM | POA: Diagnosis not present

## 2023-05-22 DIAGNOSIS — I4891 Unspecified atrial fibrillation: Secondary | ICD-10-CM | POA: Diagnosis not present

## 2023-05-22 LAB — BASIC METABOLIC PANEL
Anion gap: 7 (ref 5–15)
BUN: 9 mg/dL (ref 8–23)
CO2: 17 mmol/L — ABNORMAL LOW (ref 22–32)
Calcium: 12.2 mg/dL — ABNORMAL HIGH (ref 8.9–10.3)
Chloride: 119 mmol/L — ABNORMAL HIGH (ref 98–111)
Creatinine, Ser: 0.69 mg/dL (ref 0.44–1.00)
GFR, Estimated: >60 mL/min (ref >60)
Glucose, Bld: 75 mg/dL (ref 70–99)
Potassium: 2.8 mmol/L — ABNORMAL LOW (ref 3.5–5.1)
Sodium: 143 mmol/L (ref 135–145)

## 2023-05-22 LAB — CBC
HCT: 30.2 % — ABNORMAL LOW (ref 36.0–46.0)
Hemoglobin: 10 g/dL — ABNORMAL LOW (ref 12.0–15.0)
MCH: 29.9 pg (ref 26.0–34.0)
MCHC: 33.1 g/dL (ref 30.0–36.0)
MCV: 90.1 fL (ref 80.0–100.0)
Platelets: 175 10*3/uL (ref 150–400)
RBC: 3.35 MIL/uL — ABNORMAL LOW (ref 3.87–5.11)
RDW: 13 % (ref 11.5–15.5)
WBC: 7 10*3/uL (ref 4.0–10.5)
nRBC: 0 % (ref 0.0–0.2)

## 2023-05-22 LAB — APTT
aPTT: 106 s — ABNORMAL HIGH (ref 24–36)
aPTT: 147 s — ABNORMAL HIGH (ref 24–36)
aPTT: 114 s — ABNORMAL HIGH (ref 24–36)

## 2023-05-22 LAB — HEPARIN LEVEL (UNFRACTIONATED): Heparin Unfractionated: >1.1 [IU]/mL — ABNORMAL HIGH (ref 0.30–0.70)

## 2023-05-22 LAB — HIV ANTIBODY (ROUTINE TESTING W REFLEX): HIV Screen 4th Generation wRfx: NONREACTIVE

## 2023-05-22 MED ORDER — METOPROLOL TARTRATE 5 MG/5ML IV SOLN
5.0000 mg | INTRAVENOUS | Status: DC | PRN
  Administered 2023-05-22: 5 mg via INTRAVENOUS
  Filled 2023-05-22: qty 5

## 2023-05-22 MED ORDER — POTASSIUM CHLORIDE CRYS ER 20 MEQ PO TBCR
40.0000 meq | EXTENDED_RELEASE_TABLET | Freq: Once | ORAL | Status: AC
  Administered 2023-05-22: 40 meq via ORAL
  Filled 2023-05-22: qty 2

## 2023-05-22 MED ORDER — SODIUM CHLORIDE 0.9 % IV BOLUS
1000.0000 mL | Freq: Once | INTRAVENOUS | Status: AC
  Administered 2023-05-22: 1000 mL via INTRAVENOUS

## 2023-05-22 MED ORDER — POTASSIUM CHLORIDE 10 MEQ/100ML IV SOLN
10.0000 meq | INTRAVENOUS | Status: AC
  Administered 2023-05-22 (×2): 10 meq via INTRAVENOUS
  Filled 2023-05-22 (×2): qty 100

## 2023-05-22 MED ORDER — RIVAROXABAN 20 MG PO TABS
20.0000 mg | ORAL_TABLET | Freq: Every day | ORAL | 1 refills | Status: DC
  Filled 2023-05-22: qty 30, 30d supply, fill #0
  Filled 2023-06-13: qty 30, 30d supply, fill #1
  Filled 2023-07-15: qty 30, 30d supply, fill #2
  Filled 2023-08-17: qty 30, 30d supply, fill #3
  Filled 2023-09-16: qty 30, 30d supply, fill #4
  Filled 2023-10-13: qty 30, 30d supply, fill #5

## 2023-05-22 MED ORDER — RIVAROXABAN 20 MG PO TABS
20.0000 mg | ORAL_TABLET | Freq: Every day | ORAL | Status: DC
  Administered 2023-05-22: 20 mg via ORAL
  Filled 2023-05-22: qty 1

## 2023-05-22 NOTE — Progress Notes (Signed)
Rounding Note    Patient Name: Kristin Bradford Date of Encounter: 05/22/2023  [facility] Cardiologist:  Subjective    Pt comfortable now  Converted to SR  Inpatient Medications    Scheduled Meds:  metoprolol tartrate  25 mg Oral Q6H   Continuous Infusions:  heparin 1,100 Units/hr (05/22/23 0450)   PRN Meds: acetaminophen, ALPRAZolam, metoprolol tartrate, ondansetron (ZOFRAN) IV   Vital Signs    Vitals:   05/22/23 0800 05/22/23 0815 05/22/23 0830 05/22/23 0845  BP: 110/80 90/67 109/63 117/81  Pulse: (!) 142 (!) 147  (!) 150  Resp: 16 15 14 13   Temp:      SpO2: 100% 99% 99% 97%  Weight:      Height:        Intake/Output Summary (Last 24 hours) at 05/22/2023 4696 Last data filed at 05/22/2023 0720 Gross per 24 hour  Intake 100 ml  Output --  Net 100 ml      05/21/2023    9:20 PM 04/22/2023   11:31 AM 10/07/2021   11:47 AM  Last 3 Weights  Weight (lbs) 185 lb 186 lb 12.8 oz 198 lb  Weight (kg) 83.915 kg 84.732 kg 89.812 kg      Telemetry    SR now  Was afib with RVR - Personally Reviewed  ECG     - Personally Reviewed  Physical Exam   GEN: No acute distress.   Neck: No JVD Cardiac: RRR, no murmurs Respiratory: Clear to auscultation  GI: Soft, nontender,  MS: No edema   Labs    High Sensitivity Troponin:  No results for input(s): "TROPONINIHS" in the last 720 hours.   Chemistry Recent Labs  Lab 05/21/23 1855 05/22/23 0344  NA 138 143  K 3.7 2.8*  CL 104 119*  CO2 19* 17*  GLUCOSE 122* 75  BUN 14 9  CREATININE 1.17* 0.69  CALCIUM 9.5 12.2*  MG 2.1  --   PROT 7.0  --   ALBUMIN 3.9  --   AST 40  --   ALT 17  --   ALKPHOS 44  --   BILITOT 1.0  --   GFRNONAA 52* >60  ANIONGAP 15 7    Lipids No results for input(s): "CHOL", "TRIG", "HDL", "LABVLDL", "LDLCALC", "CHOLHDL" in the last 168 hours.  Hematology Recent Labs  Lab 05/21/23 1855 05/22/23 0344  WBC 9.0 7.0  RBC 5.11 3.35*  HGB 15.1* 10.0*  HCT 44.0 30.2*   MCV 86.1 90.1  MCH 29.5 29.9  MCHC 34.3 33.1  RDW 12.9 13.0  PLT 295 175   Thyroid  Recent Labs  Lab 05/21/23 1855  TSH 3.524  FREET4 0.83    BNPNo results for input(s): "BNP", "PROBNP" in the last 168 hours.  DDimer No results for input(s): "DDIMER" in the last 168 hours.   Radiology    DG Chest Portable 1 View Result Date: 05/21/2023 CLINICAL DATA:  Shortness of breath EXAM: PORTABLE CHEST 1 VIEW COMPARISON:  05/21/2015 FINDINGS: The heart size and mediastinal contours are within normal limits. Both lungs are clear. The visualized skeletal structures are unremarkable. IMPRESSION: No active disease. Electronically Signed   By: Jasmine Pang M.D.   On: 05/21/2023 21:48    Cardiac Studies   10-29-21 CT coronary 1. Left Main: No significant stenosis   2. LAD: CTFFR 0.93 across lesion in proximal LAD   3. LCX: No significant stenosis   4. RCA: No significant stenosis   IMPRESSION: 1.  CTFFR suggests nonobstructive CAD   09-06-21 TTE 1. Left ventricular ejection fraction, by estimation, is 55 to 60%. The  left ventricle has normal function. The left ventricle has no regional  wall motion abnormalities. Left ventricular diastolic parameters were  normal.   2. Right ventricular systolic function is normal. The right ventricular  size is normal.   3. Left atrial size was mildly dilated.   4. The mitral valve is normal in structure. Trivial mitral valve  regurgitation. No evidence of mitral stenosis.   5. The aortic valve is tricuspid. There is mild calcification of the  aortic valve. Aortic valve regurgitation is not visualized. Aortic valve  sclerosis/calcification is present, without any evidence of aortic  stenosis.   6. The inferior vena cava is normal in size with greater than 50%  respiratory variability, suggesting right atrial pressure of 3 mmHg.     Patient Profile     67 y.o. female with PAF who is being seen 05/22/2023 for the evaluation of AF w/ RVR.    Assessment & Plan    1  PAF  Pt presented to ER with afib with RVR   It had been going on for probably several days  By time got to ER was very weak   She has now converted to SR on own   Feeling good  On IV heparin  The pt's CHADSVASc is 37 (age, female, mild coronary artery plaquing )   She should be on anticoagulation  Would Rx with Xarelto   She took a dose yesterday   and Wednesday (wed though she did vomit) Review with pharmacy, especially since converted  Metoprolol to take as neede (immediate acting) 25mg       Will arrange for follow up with Calvert Cantor (EP) for eval/consideration of afib ablation  OK to D/C if ambulating OK     For questions or updates, please contact [facility] Please consult [email or web address] for contact info under        Signed, Dietrich Pates, MD  05/22/2023, 9:22 AM

## 2023-05-22 NOTE — Progress Notes (Signed)
PHARMACY - ANTICOAGULATION CONSULT NOTE  Pharmacy Consult for: Transition IV Heparin back to Xarelto Indication: atrial fibrillation  Allergies  Allergen Reactions   Fentanyl Nausea And Vomiting   Betadine [Povidone Iodine] Rash   Codeine Nausea And Vomiting   Other     Steroid injections make her hair fall out   Penicillins Itching    Has patient had a PCN reaction causing immediate rash, facial/tongue/throat swelling, SOB or lightheadedness with hypotension: Yes Has patient had a PCN reaction causing severe rash involving mucus membranes or skin necrosis: No Has patient had a PCN reaction that required hospitalization No Has patient had a PCN reaction occurring within the last 10 years: No If all of the above answers are "NO", then may proceed with Cephalosporin use.    Sulfa Antibiotics Swelling    Lips   Tetanus Immune Globulin     Other reaction(s): fever, vomiting   Tetanus Toxoids Nausea And Vomiting and Other (See Comments)    High fever    Patient Measurements: Height: 5' 10.5" (179.1 cm) Weight: 83.9 kg (185 lb) IBW/kg (Calculated) : 69.65 Heparin Dosing Weight: 83.9 kg  Vital Signs: Temp: 97.9 F (36.6 C) (12/20 1550) Temp Source: Oral (12/20 1550) BP: 99/54 (12/20 1550) Pulse Rate: 66 (12/20 1350)  Labs: Recent Labs    05/21/23 1855 05/21/23 2218 05/21/23 2218 05/22/23 0344 05/22/23 1045 05/22/23 1136  HGB 15.1*  --   --  10.0*  --   --   HCT 44.0  --   --  30.2*  --   --   PLT 295  --   --  175  --   --   APTT  --  47*   < > 106* 147* 114*  HEPARINUNFRC  --  >1.10*  --  >1.10*  --   --   CREATININE 1.17*  --   --  0.69  --   --    < > = values in this interval not displayed.    Estimated Creatinine Clearance: 83.5 mL/min (by C-G formula based on SCr of 0.69 mg/dL).   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   Acquired deafness of left ear    S/P  RESECTION ACOUSTIC NEUROMA  2004    Acquired facial asymmetry    post surgery   Angio-edema    Anxiety 05/21/2023   Atrial fibrillation (HCC) 05/18/2015   Fibroids 03/30/2019   GERD (gastroesophageal reflux disease)    Gestational diabetes mellitus    gestastional   Hyperlipidemia    Hypothyroidism    hx of no meds now   IC (interstitial cystitis)    Migraine    "q several months" (05/22/2015)   Neuromuscular disorder (HCC)    tremor to right side when upset    PONV (postoperative nausea and vomiting)    atrial fib   Premature atrial contractions    PVC's (premature ventricular contractions)    Recurrent oral ulcers 06/30/2019   S/P myomectomy 03/30/2019   Seasonal and perennial allergic rhinitis 06/30/2019   SVT (supraventricular tachycardia) (HCC)    Syncope    Urticaria     Medications:  Medications Prior to Admission  Medication Sig Dispense Refill Last Dose/Taking   ALPRAZolam (XANAX) 0.5 MG tablet Take 0.25 mg by mouth daily as needed for anxiety.   0 Past Month   COLLAGEN PO Take 0.5 Scoops by mouth daily.   05/21/2023   conjugated estrogens (PREMARIN) vaginal cream  See admin instructions.   Past Month   meloxicam (MOBIC) 7.5 MG tablet Take 1 tablet (7.5 mg total) by mouth 2 (two) times daily for 2 weeks and then as needed. 60 tablet 0 Past Week   metoprolol succinate (TOPROL-XL) 25 MG 24 hr tablet Take 1 tablet (25 mg total) by mouth daily. Patient must keep upcoming appointment in November with cardiologist before further refill. Thank you, Final attempt. (Patient taking differently: Take 12.5 mg by mouth daily. Patient must keep upcoming appointment in November with cardiologist before further refill. Thank you, Final attempt.) 30 tablet 0 05/21/2023 at  8:00 AM   metoprolol tartrate (LOPRESSOR) 25 MG tablet Take 1/2 tablet (12.5 mg total) by mouth daily as needed for breakthrough palpitations (Patient taking differently: Take 12.5 mg by mouth daily.) 45 tablet 3 05/20/2023 at 12:00 PM    nystatin-triamcinolone (MYCOLOG II) cream APPLY TO THE AFFECTED AREAS 2 TIMES PER DAY IN THE MORNING AND EVENING (Patient taking differently: Apply 1 Application topically daily as needed (for rash).) 60 g 3 Past Month   phenazopyridine (PYRIDIUM) 100 MG tablet Take 1 tablet by mouth 3 times daily as needed (Patient taking differently: Take 100 mg by mouth 3 (three) times daily as needed for pain.) 30 tablet 3 Past Month   polyethylene glycol (MIRALAX / GLYCOLAX) packet Take 17 g by mouth daily.   05/20/2023   Polyethylene Glycol 400 (BLINK TEARS OP) Place 1 drop into both eyes daily as needed (For dry eyes).   Past Week   progesterone (PROMETRIUM) 100 MG capsule TAKE 1 CAPSULE BY MOUTH DAILY 90 capsule 3 Past Week   Rivaroxaban (XARELTO) 15 MG TABS tablet Take 1 tablet (15 mg total) by mouth as needed (afib). 45 tablet 0 05/21/2023 at 12:00 PM   rosuvastatin (CRESTOR) 5 MG tablet Take 1 tablet (5 mg total) by mouth daily. 30 tablet 11 Past Week   progesterone (PROMETRIUM) 100 MG capsule TAKE 1 CAPSULE BY MOUTH ONCE A DAY 90 capsule 0    venlafaxine XR (EFFEXOR-XR) 37.5 MG 24 hr capsule Take 1 capsule (37.5 mg total) by mouth daily with food. (Patient not taking: Reported on 05/21/2023) 90 capsule 3 Not Taking    Assessment: 67 yo F presents to ED with rapid palpitations, nausea, and lightheadedness that started a few nights ago. She was found to be in Afib and has a history of paroxysmal Afib and takes metoprolol tartrate and Xarelto as needed. She took a dose of rivaroxaban 20mg  x1 on 12/19 at 12pm (pt had 20mg  rivaroxaban sample tablets from cardiology clinic and did not use prescription dose). Patient has not been taking Xarelto for Afib prior to today. Pharmacy was originally consulted to dose heparin infusion for Afib. Now Pharmacy consulted to transition back to Xarelto.   H/H slightly ow at 10/30.2, no bleeding reported. Platelets are within normal limits.   Goal of Therapy:  Monitor  platelets by anticoagulation protocol: Yes   Plan:  Start Xarelto 20 mg po daily with supper. Stop Heparin at the same time the first dose of Xarelto is administered.  Monitor CBC and for signs/symptoms bleeding.   Link Snuffer, PharmD, BCPS, BCCCP Please refer to [facility] for [facility] numbers 05/22/2023 4:30 PM

## 2023-05-22 NOTE — Plan of Care (Signed)
  Problem: Education: Goal: Knowledge of General Education information will improve Description Including pain rating scale, medication(s)/side effects and non-pharmacologic comfort measures Outcome: Progressing   Problem: Health Behavior/Discharge Planning: Goal: Ability to manage health-related needs will improve Outcome: Progressing   

## 2023-05-22 NOTE — Assessment & Plan Note (Addendum)
-   patient endorse only 2-3 episodes a year of RVR at which time she takes xarelto and tartrate typically with good response; very symptomatic when converts to afib with RVR -Was started on heparin drip in the ER - compliant on Toprol daily; only takes xarelto with acute episodes per instructions  - has been borderline BP on admission with 90s/50-60s - s/p IVF; will continue to bolus to help BP if able, but has persistent RVR when seen this morning in ER with rates in 140s sustained and patient symptomatic - tentatively was planned for TEE/DCCV but appears HR may be responding to treatment and might have converted back (HR now 60s); will check again her rhythm on tele; final plan deferred to cardiology

## 2023-05-22 NOTE — Progress Notes (Signed)
Progress Note    Kristin Bradford   GMW:[id number]  DOB: [birth year]  DOA: 05/21/2023     0 PCP: Lupita Raider, MD  Initial CC: N/V, dizzy  Hospital Course: Kristin Bradford is a 67 y.o. female with PMH PAC, HTN, GERD, anxiety who presented with ongoing RVR at home and associated nausea/vomiting and lightheadedness.  She typically takes metoprolol tartrate when feeling breakthrough RVR (on chronic Toprol at baseline).  Also takes Xarelto only a couple times a year when having breakthrough RVR also per instructions.  Due to no improvement of her symptoms at home she presented to the ER. On workup she was found to be in A-fib with RVR with hypotension.  Diltiazem was initially tried which was then transitioned to metoprolol.  Cardiology was also consulted on admission.  Interval History:  Still in A-fib with RVR with rate in the 140s this morning and remaining symptomatic.  Was not given Lopressor overnight due to blood pressure however.  Giving bolus this morning and IV Lopressor in efforts to lower rate.   Assessment and Plan: * Atrial fibrillation with RVR (HCC) - patient endorse only 2-3 episodes a year of RVR at which time she takes xarelto and tartrate typically with good response; very symptomatic when converts to afib with RVR - compliant on Toprol daily; only takes xarelto with acute episodes per instructions  - has been borderline BP on admission with 90s/50-60s - s/p IVF; will continue to bolus to help BP if able, but has persistent RVR when seen this morning in ER with rates in 140s sustained and patient symptomatic - tentatively was planned for TEE/DCCV but appears HR may be responding to treatment and might have converted back (HR now 60s); will check again her rhythm on tele; final plan deferred to cardiology   Hypertension - Currently hypotensive in the ER - Giving fluids as needed  Anxiety - Seems to have Effexor prescribed but not yet started -Xanax is old prescription  noted on database and not routinely filled  Hypothyroidism - No medication noted on med rec - TSH and free T4 normal   Old records reviewed in assessment of this patient  Antimicrobials:   DVT prophylaxis:  Heparin drip   Code Status:   Code Status: Full Code  Mobility Assessment (Last 72 Hours)     Mobility Assessment     Row Name 05/22/23 07:23:15           Does patient have an order for bedrest or is patient medically unstable No - Continue assessment       What is the highest level of mobility based on the progressive mobility assessment? Level 6 (Walks independently in room and hall) - Balance while walking in room without assist - Complete                Barriers to discharge:  Disposition Plan:  Home Status is: Obs  Objective: Blood pressure 93/60, pulse 65, temperature 97.8 F (36.6 C), temperature source Oral, resp. rate 15, height 5' 10.5" (1.791 m), weight 83.9 kg, SpO2 100%.  Examination:  Physical Exam Constitutional:      Comments: Uncomfortable appearing  HENT:     Head: Normocephalic and atraumatic.     Mouth/Throat:     Mouth: Mucous membranes are moist.  Eyes:     Extraocular Movements: Extraocular movements intact.  Cardiovascular:     Rate and Rhythm: Tachycardia present. Rhythm irregular.  Pulmonary:     Effort: Pulmonary effort is  normal. No respiratory distress.     Breath sounds: Normal breath sounds. No wheezing.  Abdominal:     General: Bowel sounds are normal. There is no distension.     Palpations: Abdomen is soft.     Tenderness: There is no abdominal tenderness.  Musculoskeletal:        General: Normal range of motion.     Cervical back: Normal range of motion and neck supple.  Skin:    General: Skin is warm and dry.  Neurological:     General: No focal deficit present.     Mental Status: She is alert.  Psychiatric:        Mood and Affect: Mood normal.      Consultants:  Cardiology  Procedures:    Data  Reviewed: Results for orders placed or performed during the hospital encounter of 05/21/23 (from the past 24 hours)  Magnesium     Status: None   Collection Time: 05/21/23  6:55 PM  Result Value Ref Range   Magnesium 2.1 1.7 - 2.4 mg/dL  TSH     Status: None   Collection Time: 05/21/23  6:55 PM  Result Value Ref Range   TSH 3.524 0.350 - 4.500 uIU/mL  CBC with Differential     Status: Abnormal   Collection Time: 05/21/23  6:55 PM  Result Value Ref Range   WBC 9.0 4.0 - 10.5 K/uL   RBC 5.11 3.87 - 5.11 MIL/uL   Hemoglobin 15.1 (H) 12.0 - 15.0 g/dL   HCT 16.1 09.6 - 04.5 %   MCV 86.1 80.0 - 100.0 fL   MCH 29.5 26.0 - 34.0 pg   MCHC 34.3 30.0 - 36.0 g/dL   RDW 40.9 81.1 - 91.4 %   Platelets 295 150 - 400 K/uL   nRBC 0.0 0.0 - 0.2 %   Neutrophils Relative % 69 %   Neutro Abs 6.2 1.7 - 7.7 K/uL   Lymphocytes Relative 22 %   Lymphs Abs 1.9 0.7 - 4.0 K/uL   Monocytes Relative 7 %   Monocytes Absolute 0.7 0.1 - 1.0 K/uL   Eosinophils Relative 1 %   Eosinophils Absolute 0.1 0.0 - 0.5 K/uL   Basophils Relative 1 %   Basophils Absolute 0.1 0.0 - 0.1 K/uL   Immature Granulocytes 0 %   Abs Immature Granulocytes 0.03 0.00 - 0.07 K/uL  Comprehensive metabolic panel     Status: Abnormal   Collection Time: 05/21/23  6:55 PM  Result Value Ref Range   Sodium 138 135 - 145 mmol/L   Potassium 3.7 3.5 - 5.1 mmol/L   Chloride 104 98 - 111 mmol/L   CO2 19 (L) 22 - 32 mmol/L   Glucose, Bld 122 (H) 70 - 99 mg/dL   BUN 14 8 - 23 mg/dL   Creatinine, Ser 7.82 (H) 0.44 - 1.00 mg/dL   Calcium 9.5 8.9 - 95.6 mg/dL   Total Protein 7.0 6.5 - 8.1 g/dL   Albumin 3.9 3.5 - 5.0 g/dL   AST 40 15 - 41 U/L   ALT 17 0 - 44 U/L   Alkaline Phosphatase 44 38 - 126 U/L   Total Bilirubin 1.0 <1.2 mg/dL   GFR, Estimated 52 (L) >60 mL/min   Anion gap 15 5 - 15  T4, free     Status: None   Collection Time: 05/21/23  6:55 PM  Result Value Ref Range   Free T4 0.83 0.61 - 1.12 ng/dL  Urinalysis, Routine w  reflex microscopic -Urine, Clean Catch     Status: Abnormal   Collection Time: 05/21/23  8:25 PM  Result Value Ref Range   Color, Urine STRAW (A) YELLOW   APPearance CLEAR CLEAR   Specific Gravity, Urine 1.008 1.005 - 1.030   pH 6.0 5.0 - 8.0   Glucose, UA NEGATIVE NEGATIVE mg/dL   Hgb urine dipstick NEGATIVE NEGATIVE   Bilirubin Urine NEGATIVE NEGATIVE   Ketones, ur 20 (A) NEGATIVE mg/dL   Protein, ur NEGATIVE NEGATIVE mg/dL   Nitrite NEGATIVE NEGATIVE   Leukocytes,Ua NEGATIVE NEGATIVE  Heparin level (unfractionated)     Status: Abnormal   Collection Time: 05/21/23 10:18 PM  Result Value Ref Range   Heparin Unfractionated >1.10 (H) 0.30 - 0.70 IU/mL  APTT     Status: Abnormal   Collection Time: 05/21/23 10:18 PM  Result Value Ref Range   aPTT 47 (H) 24 - 36 seconds  HIV Antibody (routine testing w rflx)     Status: None   Collection Time: 05/22/23  3:44 AM  Result Value Ref Range   HIV Screen 4th Generation wRfx Non Reactive Non Reactive  Basic metabolic panel     Status: Abnormal   Collection Time: 05/22/23  3:44 AM  Result Value Ref Range   Sodium 143 135 - 145 mmol/L   Potassium 2.8 (L) 3.5 - 5.1 mmol/L   Chloride 119 (H) 98 - 111 mmol/L   CO2 17 (L) 22 - 32 mmol/L   Glucose, Bld 75 70 - 99 mg/dL   BUN 9 8 - 23 mg/dL   Creatinine, Ser 4.01 0.44 - 1.00 mg/dL   Calcium 02.7 (H) 8.9 - 10.3 mg/dL   GFR, Estimated >25 >36 mL/min   Anion gap 7 5 - 15  CBC     Status: Abnormal   Collection Time: 05/22/23  3:44 AM  Result Value Ref Range   WBC 7.0 4.0 - 10.5 K/uL   RBC 3.35 (L) 3.87 - 5.11 MIL/uL   Hemoglobin 10.0 (L) 12.0 - 15.0 g/dL   HCT 64.4 (L) 03.4 - 74.2 %   MCV 90.1 80.0 - 100.0 fL   MCH 29.9 26.0 - 34.0 pg   MCHC 33.1 30.0 - 36.0 g/dL   RDW 59.5 63.8 - 75.6 %   Platelets 175 150 - 400 K/uL   nRBC 0.0 0.0 - 0.2 %  Heparin level (unfractionated)     Status: Abnormal   Collection Time: 05/22/23  3:44 AM  Result Value Ref Range   Heparin Unfractionated >1.10  (H) 0.30 - 0.70 IU/mL  APTT     Status: Abnormal   Collection Time: 05/22/23  3:44 AM  Result Value Ref Range   aPTT 106 (H) 24 - 36 seconds  APTT     Status: Abnormal   Collection Time: 05/22/23 10:45 AM  Result Value Ref Range   aPTT 147 (H) 24 - 36 seconds    I have reviewed pertinent nursing notes, vitals, labs, and images as necessary. I have ordered labwork to follow up on as indicated.  I have reviewed the last notes from staff over past 24 hours. I have discussed patient's care plan and test results with nursing staff, CM/SW, and other staff as appropriate.  Time spent: Greater than 50% of the 55 minute visit was spent in counseling/coordination of care for the patient as laid out in the A&P.   LOS: 0 days   Lewie Chamber, MD [facility] 05/22/2023, 12:23 PM

## 2023-05-22 NOTE — Discharge Instructions (Signed)

## 2023-05-22 NOTE — Assessment & Plan Note (Signed)
-   hypotensive since admission - Giving fluids as needed

## 2023-05-22 NOTE — Progress Notes (Signed)
PHARMACY - ANTICOAGULATION CONSULT NOTE  Pharmacy Consult for heparin Indication: atrial fibrillation  Labs: Recent Labs    05/21/23 1855 05/21/23 2218 05/22/23 0344  HGB 15.1*  --  10.0*  HCT 44.0  --  30.2*  PLT 295  --  175  APTT  --  47* 106*  HEPARINUNFRC  --  >1.10* >1.10*  CREATININE 1.17*  --  0.69   Assessment: 67yo female slightly supratherapeutic on heparin with initial dosing for Afib (took one dose of rivaroxaban PTA); no infusion issues or signs of bleeding per RN.  Goal of Therapy:  aPTT 66-102 seconds   Plan:  Decrease heparin infusion by ~1 units/kg/hr to 1100 units/hr. Check PTT in 6 hours.   Vernard Gambles, PharmD, BCPS 05/22/2023 4:50 AM

## 2023-05-22 NOTE — ED Notes (Signed)
Patient given hospital bed to make more comfortable.

## 2023-05-22 NOTE — Hospital Course (Signed)
Kristin Bradford is a 67 y.o. female with PMH PAC, HTN, GERD, anxiety who presented with ongoing RVR at home and associated nausea/vomiting and lightheadedness.  She typically takes metoprolol tartrate when feeling breakthrough RVR (on chronic Toprol at baseline).  Also takes Xarelto only a couple times a year when having breakthrough RVR also per instructions.  Due to no improvement of her symptoms at home she presented to the ER. On workup she was found to be in A-fib with RVR with hypotension.  Diltiazem was initially tried which was then transitioned to metoprolol.  Cardiology was also consulted on admission. She was treated with metoprolol tartrate and had spontaneous conversion back to NSR.  She did not require TEE cardioversion. She was continued on Toprol at discharge with PRN Lopressor as she has been doing at home.  She was also started on scheduled and consistent Xarelto at discharge. She is being arranged for outpatient follow-up with EP.

## 2023-05-22 NOTE — H&P (Signed)
Cardiology Consult Note   Patient ID: Kristin Bradford MRN: [id number]; DOB: March 22, 1956   Admission date: 05/21/2023  PCP:  Lupita Raider, MD   [facility] Providers Cardiologist:  Lance Muss, MD  Electrophysiologist:  Lewayne Bunting, MD       Chief Complaint:  AF  Patient Profile:   Kristin Bradford is a 67 y.o. female with PAF who is being seen 05/22/2023 for the evaluation of AF w/ RVR.  History of Present Illness:   [name] has h/o PAF and has been following w/ EP. She presented to the ED c/o several days of HR>100bpm w/ associated N/V, dizziness and palpitations.  Pt has been given the green light to only take xarelto when she has episodes of AF, so she has not been anti-coagulated consistently. She did take a dose of xarelto yesterday. Her Chads2vasc score is 2 (female, age 46). In the ED, diltiazem gtt was started, but was ineffective for rate control and only dropped pressure.  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   Acquired deafness of left ear    S/P  RESECTION ACOUSTIC NEUROMA  2004   Acquired facial asymmetry    post surgery   Angio-edema    Anxiety 05/21/2023   Atrial fibrillation (HCC) 05/18/2015   Fibroids 03/30/2019   GERD (gastroesophageal reflux disease)    Gestational diabetes mellitus    gestastional   Hyperlipidemia    Hypothyroidism    hx of no meds now   IC (interstitial cystitis)    Migraine    "q several months" (05/22/2015)   Neuromuscular disorder (HCC)    tremor to right side when upset    PONV (postoperative nausea and vomiting)    atrial fib   Premature atrial contractions    PVC's (premature ventricular contractions)    Recurrent oral ulcers 06/30/2019   S/P myomectomy 03/30/2019   Seasonal and perennial allergic rhinitis 06/30/2019   SVT (supraventricular tachycardia) (HCC)    Syncope    Urticaria     Past Surgical History:  Procedure Laterality Date   ACOUSTIC NEUROMA  RESECTION Left 2004   BRAIN SURGERY     CATARACT EXTRACTION W/ INTRAOCULAR LENS IMPLANT Bilateral 01/2014   COLONOSCOPY  11/2014   CYSTO WITH HYDRODISTENSION N/A 05/18/2015   Procedure: CYSTOSCOPY/HYDRODISTENSION;  Surgeon: Jethro Bolus, MD;  Location: [facility];  Service: Urology;  Laterality: N/A;   CYSTO WITH HYDRODISTENSION N/A 03/30/2019   Procedure: CYSTOSCOPY/HYDRODISTENSION;  Surgeon: Sebastian Ache, MD;  Location: [facility];  Service: Urology;  Laterality: N/A;   CYSTO/  HYDRODISTENTION/  INSTILLATION THERAPY  x4  last one 05-16-2010   since 1990's   ESOPHAGOGASTRODUODENOSCOPY  11/2014   HEMANGIOMA EXCISION Left 2005    FACIAL ANGIOMA REMOVAL / MASTOIDECTOMY   HEMORRHOID BANDING  05/2015   MASTOIDECTOMY Left 2005   LEFT FACIAL ANGIOMA REMOVAL /  LEFT MASTOIDECTOMY [Other]   MYOMECTOMY N/A 03/30/2019   Procedure: ABDOMINAL MYOMECTOMY;  Surgeon: Marcelle Overlie, MD;  Location: [facility];  Service: Gynecology;  Laterality: N/A;   TOTAL HIP ARTHROPLASTY Left 10/24/2016   Procedure: LEFT TOTAL HIP ARTHROPLASTY ANTERIOR APPROACH;  Surgeon: Kathryne Hitch, MD;  Location: [facility];  Service: Orthopedics;  Laterality: Left;   WISDOM TOOTH EXTRACTION  1980   "all 4"     Medications Prior to Admission: Prior to Admission medications   Medication Sig Start Date End Date Taking? Authorizing Provider  ALPRAZolam Prudy Feeler) 0.5 MG tablet  Take 0.25 mg by mouth daily as needed for anxiety.  07/23/16  Yes [provider]  COLLAGEN PO Take 0.5 Scoops by mouth daily.   Yes [provider]  conjugated estrogens (PREMARIN) vaginal cream See admin instructions. 10/02/21  Yes [provider]  meloxicam (MOBIC) 7.5 MG tablet Take 1 tablet (7.5 mg total) by mouth 2 (two) times daily for 2 weeks and then as needed. 04/29/23  Yes   metoprolol succinate (TOPROL-XL) 25 MG 24 hr tablet Take 1 tablet (25 mg total) by mouth daily. Patient must keep upcoming  appointment in November with cardiologist before further refill. Thank you, Final attempt. Patient taking differently: Take 12.5 mg by mouth daily. Patient must keep upcoming appointment in November with cardiologist before further refill. Thank you, Final attempt. 03/24/23  Yes Marinus Maw, MD  metoprolol tartrate (LOPRESSOR) 25 MG tablet Take 1/2 tablet (12.5 mg total) by mouth daily as needed for breakthrough palpitations Patient taking differently: Take 12.5 mg by mouth daily. 11/11/21  Yes Graciella Freer, PA-C  nystatin-triamcinolone (MYCOLOG II) cream APPLY TO THE AFFECTED AREAS 2 TIMES PER DAY IN THE MORNING AND EVENING Patient taking differently: Apply 1 Application topically daily as needed (for rash). 11/22/21  Yes   phenazopyridine (PYRIDIUM) 100 MG tablet Take 1 tablet by mouth 3 times daily as needed Patient taking differently: Take 100 mg by mouth 3 (three) times daily as needed for pain. 02/28/21  Yes   polyethylene glycol (MIRALAX / GLYCOLAX) packet Take 17 g by mouth daily.   Yes [provider]  Polyethylene Glycol 400 (BLINK TEARS OP) Place 1 drop into both eyes daily as needed (For dry eyes).   Yes [provider]  progesterone (PROMETRIUM) 100 MG capsule TAKE 1 CAPSULE BY MOUTH DAILY 10/15/21  Yes   Rivaroxaban (XARELTO) 15 MG TABS tablet Take 1 tablet (15 mg total) by mouth as needed (afib). 04/22/23  Yes Graciella Freer, PA-C  rosuvastatin (CRESTOR) 5 MG tablet Take 1 tablet (5 mg total) by mouth daily. 05/05/22  Yes   nitrofurantoin, macrocrystal-monohydrate, (MACROBID) 100 MG capsule Take 1 capsule (100 mg total) by mouth 2 (two) times daily for 7 days with food as needed for UTIs. Patient not taking: Reported on 05/21/2023 10/08/22     progesterone (PROMETRIUM) 100 MG capsule TAKE 1 CAPSULE BY MOUTH ONCE A DAY 10/07/21 10/07/22  Marcelle Overlie, MD  venlafaxine XR (EFFEXOR-XR) 37.5 MG 24 hr capsule Take 1 capsule (37.5 mg total) by mouth daily  with food. Patient not taking: Reported on 05/21/2023 04/08/23        Allergies:    Allergies  Allergen Reactions   Fentanyl Nausea And Vomiting   Betadine [Povidone Iodine] Rash   Codeine Nausea And Vomiting   Other     Steroid injections make her hair fall out   Penicillins Itching    Has patient had a PCN reaction causing immediate rash, facial/tongue/throat swelling, SOB or lightheadedness with hypotension: Yes Has patient had a PCN reaction causing severe rash involving mucus membranes or skin necrosis: No Has patient had a PCN reaction that required hospitalization No Has patient had a PCN reaction occurring within the last 10 years: No If all of the above answers are "NO", then may proceed with Cephalosporin use.    Sulfa Antibiotics Swelling    Lips   Tetanus Immune Globulin     Other reaction(s): fever, vomiting   Tetanus Toxoids Nausea And Vomiting and Other (See Comments)  High fever    Social History:   Social History   Socioeconomic History   Marital status: Legally Separated    Spouse name: Not on file   Number of children: Not on file   Years of education: Not on file   Highest education level: Not on file  Occupational History   Not on file  Tobacco Use   Smoking status: Never   Smokeless tobacco: Never  Vaping Use   Vaping status: Never Used  Substance and Sexual Activity   Alcohol use: Yes    Alcohol/week: 1.0 standard drink of alcohol    Types: 1 Glasses of wine per week    Comment: rare   Drug use: No   Sexual activity: Not Currently    Birth control/protection: None  Other Topics Concern   Not on file  Social History Narrative   Not on file   Social Drivers of Health   Financial Resource Strain: Not on file  Food Insecurity: Low Risk  (02/23/2023)   Received from [facility]   Hunger Vital Sign    Worried About Running Out of Food in the Last Year: Never true    Ran Out of Food in the Last Year: Never true  Transportation Needs:  No Transportation Needs (02/23/2023)   Received from [facility]    In the past 12 months, has lack of reliable transportation kept you from medical appointments, meetings, work or from getting things needed for daily living? : No  Physical Activity: Not on file  Stress: Not on file  Social Connections: Not on file  Intimate Partner Violence: Not on file    Family History:   The patient's family history is not on file. She was adopted.    ROS:  Please see the history of present illness.  All other ROS reviewed and negative.     Physical Exam/Data:   Vitals:   05/21/23 2110 05/21/23 2118 05/21/23 2120 05/21/23 2315  BP:  121/68 121/68 104/78  Pulse:   (!) 151 (!) 45  Resp:  16  15  Temp: 98.3 F (36.8 C)     SpO2:  100%  100%  Weight:   83.9 kg   Height:   5' 10.5" (1.791 m)    No intake or output data in the 24 hours ending 05/22/23 0032    05/21/2023    9:20 PM 04/22/2023   11:31 AM 10/07/2021   11:47 AM  Last 3 Weights  Weight (lbs) 185 lb 186 lb 12.8 oz 198 lb  Weight (kg) 83.915 kg 84.732 kg 89.812 kg     Body mass index is 26.17 kg/m.  General:  Well nourished, well developed, in no acute distress HEENT: normal Neck: no JVD Vascular: No carotid bruits; Distal pulses 2+ bilaterally   Cardiac:  normal S1, S2; irreg irreg, tachy; no murmur  Lungs:  clear to auscultation bilaterally, no wheezing, rhonchi or rales  Abd: soft, nontender, no hepatomegaly  Ext: no edema Musculoskeletal:  No deformities Skin: warm and dry  Neuro:  CNs 2-12 intact, no focal abnormalities noted Psych:  Normal affect    EKG:  The ECG that was done 05-21-23 was personally reviewed and demonstrates AF w/ HR 123  Relevant CV Studies: 10-29-21 CT coronary 1. Left Main: No significant stenosis   2. LAD: CTFFR 0.93 across lesion in proximal LAD   3. LCX: No significant stenosis   4. RCA: No significant stenosis   IMPRESSION: 1.  CTFFR suggests nonobstructive  CAD  09-06-21 TTE 1. Left ventricular ejection fraction, by estimation, is 55 to 60%. The  left ventricle has normal function. The left ventricle has no regional  wall motion abnormalities. Left ventricular diastolic parameters were  normal.   2. Right ventricular systolic function is normal. The right ventricular  size is normal.   3. Left atrial size was mildly dilated.   4. The mitral valve is normal in structure. Trivial mitral valve  regurgitation. No evidence of mitral stenosis.   5. The aortic valve is tricuspid. There is mild calcification of the  aortic valve. Aortic valve regurgitation is not visualized. Aortic valve  sclerosis/calcification is present, without any evidence of aortic  stenosis.   6. The inferior vena cava is normal in size with greater than 50%  respiratory variability, suggesting right atrial pressure of 3 mmHg.   Laboratory Data:  High Sensitivity Troponin:  No results for input(s): "TROPONINIHS" in the last 720 hours.    Chemistry Recent Labs  Lab 05/21/23 1855  NA 138  K 3.7  CL 104  CO2 19*  GLUCOSE 122*  BUN 14  CREATININE 1.17*  CALCIUM 9.5  MG 2.1  GFRNONAA 52*  ANIONGAP 15    Recent Labs  Lab 05/21/23 1855  PROT 7.0  ALBUMIN 3.9  AST 40  ALT 17  ALKPHOS 44  BILITOT 1.0   Lipids No results for input(s): "CHOL", "TRIG", "HDL", "LABVLDL", "LDLCALC", "CHOLHDL" in the last 168 hours. Hematology Recent Labs  Lab 05/21/23 1855  WBC 9.0  RBC 5.11  HGB 15.1*  HCT 44.0  MCV 86.1  MCH 29.5  MCHC 34.3  RDW 12.9  PLT 295   Thyroid  Recent Labs  Lab 05/21/23 1855  TSH 3.524  FREET4 0.83   BNPNo results for input(s): "BNP", "PROBNP" in the last 168 hours.  DDimer No results for input(s): "DDIMER" in the last 168 hours.   Radiology/Studies:  DG Chest Portable 1 View Result Date: 05/21/2023 CLINICAL DATA:  Shortness of breath EXAM: PORTABLE CHEST 1 VIEW COMPARISON:  05/21/2015 FINDINGS: The heart size and mediastinal  contours are within normal limits. Both lungs are clear. The visualized skeletal structures are unremarkable. IMPRESSION: No active disease. Electronically Signed   By: Jasmine Pang M.D.   On: 05/21/2023 21:48     Assessment and Plan:   AF: paroxysmal. Pt has not been on OAC. She has restarted her xarelto but only been on it x 1 dose, and gives several-day history of probable AF w/ RVR. For this reason I would avoid cardioversion or using amiodarone for rate control (given the risk of electrical cardioversion) until the presence of thrombus can be excluded by TEE. Will plan for TEE and DCCV in the AM. Consideration for AAT +/- ablation can be discussed once NSR has been restored. HR may be difficult to control overnight but would recommend switching from CCB to BB to see if it is more effective at rate control w/ less BP-dropping effect.     Risk Assessment/Risk Scores:         CHA2DS2-VASc Score = 2    This indicates a 2.2 % annual risk of stroke. The patient's score is based upon:   Female, age 67     For questions or updates, please contact [facility] Please consult [email or web address] for contact info under     Signed, Precious Reel, MD, [facility]  05/22/2023 12:32 AM

## 2023-05-22 NOTE — Assessment & Plan Note (Signed)
-   No medication noted on med rec - TSH and free T4 normal

## 2023-05-22 NOTE — Assessment & Plan Note (Signed)
-   Seems to have Effexor prescribed but not yet started -Xanax is old prescription noted on database and not routinely filled

## 2023-05-22 NOTE — Progress Notes (Signed)
PHARMACY - ANTICOAGULATION CONSULT NOTE  Pharmacy Consult for heparin infusion Indication: atrial fibrillation  Allergies  Allergen Reactions   Fentanyl Nausea And Vomiting   Betadine [Povidone Iodine] Rash   Codeine Nausea And Vomiting   Other     Steroid injections make her hair fall out   Penicillins Itching    Has patient had a PCN reaction causing immediate rash, facial/tongue/throat swelling, SOB or lightheadedness with hypotension: Yes Has patient had a PCN reaction causing severe rash involving mucus membranes or skin necrosis: No Has patient had a PCN reaction that required hospitalization No Has patient had a PCN reaction occurring within the last 10 years: No If all of the above answers are "NO", then may proceed with Cephalosporin use.    Sulfa Antibiotics Swelling    Lips   Tetanus Immune Globulin     Other reaction(s): fever, vomiting   Tetanus Toxoids Nausea And Vomiting and Other (See Comments)    High fever    Patient Measurements: Height: 5' 10.5" (179.1 cm) Weight: 83.9 kg (185 lb) IBW/kg (Calculated) : 69.65 Heparin Dosing Weight: 83.9 kg  Vital Signs: Temp: 97.8 F (36.6 C) (12/20 0930) Temp Source: Oral (12/20 0930) BP: 93/60 (12/20 1130) Pulse Rate: 65 (12/20 1130)  Labs: Recent Labs    05/21/23 1855 05/21/23 2218 05/21/23 2218 05/22/23 0344 05/22/23 1045 05/22/23 1136  HGB 15.1*  --   --  10.0*  --   --   HCT 44.0  --   --  30.2*  --   --   PLT 295  --   --  175  --   --   APTT  --  47*   < > 106* 147* 114*  HEPARINUNFRC  --  >1.10*  --  >1.10*  --   --   CREATININE 1.17*  --   --  0.69  --   --    < > = values in this interval not displayed.    Estimated Creatinine Clearance: 83.5 mL/min (by C-G formula based on SCr of 0.69 mg/dL).   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   Acquired deafness of left ear    S/P  RESECTION ACOUSTIC NEUROMA  2004   Acquired facial asymmetry     post surgery   Angio-edema    Anxiety 05/21/2023   Atrial fibrillation (HCC) 05/18/2015   Fibroids 03/30/2019   GERD (gastroesophageal reflux disease)    Gestational diabetes mellitus    gestastional   Hyperlipidemia    Hypothyroidism    hx of no meds now   IC (interstitial cystitis)    Migraine    "q several months" (05/22/2015)   Neuromuscular disorder (HCC)    tremor to right side when upset    PONV (postoperative nausea and vomiting)    atrial fib   Premature atrial contractions    PVC's (premature ventricular contractions)    Recurrent oral ulcers 06/30/2019   S/P myomectomy 03/30/2019   Seasonal and perennial allergic rhinitis 06/30/2019   SVT (supraventricular tachycardia) (HCC)    Syncope    Urticaria     Medications:  (Not in a hospital admission)   Assessment: 67 yo F presents to ED with rapid palpitations, nausea, and lightheadedness that started a few nights ago. She was found to be in Afib and has a history of paroxysmal Afib and takes metoprolol tartrate and Xarelto as needed. She took a dose of metoprolol tartrate 12.5mg  and  rivaroxaban 20mg  x1 today at 12pm (pt had 20mg  rivaroxaban sample tablets from cardiology clinic and did not use prescription dose). Pt has not been taking Xarelto for Afib prior to today. Pharmacy consulted to dose heparin infusion for Afib.  Repeat aPTT remains supratherapeutic at 114 seconds s/p rate decrease to 1100 units/hr  Goal of Therapy:  Heparin level 0.3-0.7 units/ml aPTT 66-102 seconds Monitor platelets by anticoagulation protocol: Yes   Plan:  Decrease heparin gtt to 950 units/hr F/u 6 hour aPTT/HL  Daylene Posey, PharmD, [facility] Clinical Pharmacist ED Pharmacist Phone # [phone] 05/22/2023 12:37 PM

## 2023-05-23 ENCOUNTER — Other Ambulatory Visit (HOSPITAL_COMMUNITY): Payer: Self-pay

## 2023-05-23 NOTE — Discharge Summary (Signed)
Physician Discharge Summary   Kristin Bradford QMV:[id number] DOB: May 08, 1956 DOA: 05/21/2023  PCP: Lupita Raider, MD  Admit date: 05/21/2023 Discharge date: 05/22/2023   Admitted From: Home Disposition:  Home Discharging physician: Lewie Chamber, MD Barriers to discharge: none  Recommendations at discharge: Follow up with EP   Discharge Condition: stable CODE STATUS: Full  Diet recommendation:  Diet Orders (From admission, onward)     Start     Ordered   05/22/23 0000  Diet general        05/22/23 1756            Hospital Course: Kristin Bradford is a 67 y.o. female with PMH PAC, HTN, GERD, anxiety who presented with ongoing RVR at home and associated nausea/vomiting and lightheadedness.  She typically takes metoprolol tartrate when feeling breakthrough RVR (on chronic Toprol at baseline).  Also takes Xarelto only a couple times a year when having breakthrough RVR also per instructions.  Due to no improvement of her symptoms at home she presented to the ER. On workup she was found to be in A-fib with RVR with hypotension.  Diltiazem was initially tried which was then transitioned to metoprolol.  Cardiology was also consulted on admission. She was treated with metoprolol tartrate and had spontaneous conversion back to NSR.  She did not require TEE cardioversion. She was continued on Toprol at discharge with PRN Lopressor as she has been doing at home.  She was also started on scheduled and consistent Xarelto at discharge. She is being arranged for outpatient follow-up with EP.  Assessment and Plan: * Atrial fibrillation with RVR (HCC) - patient endorse only 2-3 episodes a year of RVR at which time she takes xarelto and tartrate typically with good response; very symptomatic when converts to afib with RVR -Was started on heparin drip in the ER - compliant on Toprol daily; only takes xarelto with acute episodes per instructions  - has been borderline BP on admission with  90s/50-60s - s/p IVF and boluses as necessary given hypotension and ongoing afib RVR -Treated with oral and IV Lopressor and eventually spontaneously converted back to NSR -Evaluated by cardiology as well prior to discharge; patient will be scheduled for follow-up with EP -Continued on scheduled Toprol and PRN Lopressor as previously being done at home -Xarelto changed to scheduled and daily  Hypertension - hypotensive since admission - Giving fluids as needed  Anxiety - Seems to have Effexor prescribed but not yet started -Xanax is old prescription noted on database and not routinely filled  Hypothyroidism - No medication noted on med rec - TSH and free T4 normal   The patient's acute and chronic medical conditions were treated accordingly. On day of discharge, patient was felt deemed stable for discharge. Patient/family member advised to call PCP or come back to ER if needed.   Principal Diagnosis: Atrial fibrillation with RVR [facility])  Discharge Diagnoses: Active Hospital Problems   Diagnosis Date Noted   Atrial fibrillation with RVR (HCC) 05/21/2023    Priority: 1.   Anxiety 05/21/2023   Hypertension 05/21/2023   Hypothyroidism 05/18/2015    Resolved Hospital Problems  No resolved problems to display.     Discharge Instructions     Amb referral to AFIB Clinic   Complete by: As directed    Diet general   Complete by: As directed    Increase activity slowly   Complete by: As directed       Allergies as of 05/22/2023  Reactions   Fentanyl Nausea And Vomiting   Betadine [povidone Iodine] Rash   Codeine Nausea And Vomiting   Other    Steroid injections make her hair fall out   Penicillins Itching   Has patient had a PCN reaction causing immediate rash, facial/tongue/throat swelling, SOB or lightheadedness with hypotension: Yes Has patient had a PCN reaction causing severe rash involving mucus membranes or skin necrosis: No Has patient had a PCN reaction  that required hospitalization No Has patient had a PCN reaction occurring within the last 10 years: No If all of the above answers are "NO", then may proceed with Cephalosporin use.   Sulfa Antibiotics Swelling   Lips   Tetanus Immune Globulin    Other reaction(s): fever, vomiting   Tetanus Toxoids Nausea And Vomiting, Other (See Comments)   High fever        Medication List     TAKE these medications    ALPRAZolam 0.5 MG tablet Commonly known as: XANAX Take 0.25 mg by mouth daily as needed for anxiety.   BLINK TEARS OP Place 1 drop into both eyes daily as needed (For dry eyes).   COLLAGEN PO Take 0.5 Scoops by mouth daily.   meloxicam 7.5 MG tablet Commonly known as: MOBIC Take 1 tablet (7.5 mg total) by mouth 2 (two) times daily for 2 weeks and then as needed.   metoprolol succinate 25 MG 24 hr tablet Commonly known as: TOPROL-XL Take 1 tablet (25 mg total) by mouth daily. Patient must keep upcoming appointment in November with cardiologist before further refill. Thank you, Final attempt. What changed: how much to take   metoprolol tartrate 25 MG tablet Commonly known as: LOPRESSOR Take 1/2 tablet (12.5 mg total) by mouth daily as needed for breakthrough palpitations What changed: when to take this   nystatin-triamcinolone cream Commonly known as: MYCOLOG II APPLY TO THE AFFECTED AREAS 2 TIMES PER DAY IN THE MORNING AND EVENING What changed:  how much to take when to take this reasons to take this   phenazopyridine 100 MG tablet Commonly known as: Pyridium Take 1 tablet by mouth 3 times daily as needed What changed:  how much to take how to take this when to take this reasons to take this   polyethylene glycol 17 g packet Commonly known as: MIRALAX / GLYCOLAX Take 17 g by mouth daily.   Premarin vaginal cream Generic drug: conjugated estrogens See admin instructions.   progesterone 100 MG capsule Commonly known as: PROMETRIUM TAKE 1 CAPSULE BY  MOUTH DAILY What changed: Another medication with the same name was removed. Continue taking this medication, and follow the directions you see here.   rosuvastatin 5 MG tablet Commonly known as: CRESTOR Take 1 tablet (5 mg total) by mouth daily.   venlafaxine XR 37.5 MG 24 hr capsule Commonly known as: EFFEXOR-XR Take 1 capsule (37.5 mg total) by mouth daily with food.   Xarelto 20 MG Tabs tablet Generic drug: rivaroxaban Take 1 tablet (20 mg total) by mouth daily with supper. What changed:  medication strength how much to take when to take this reasons to take this        Allergies  Allergen Reactions   Fentanyl Nausea And Vomiting   Betadine [Povidone Iodine] Rash   Codeine Nausea And Vomiting   Other     Steroid injections make her hair fall out   Penicillins Itching    Has patient had a PCN reaction causing immediate rash, facial/tongue/throat swelling, SOB  or lightheadedness with hypotension: Yes Has patient had a PCN reaction causing severe rash involving mucus membranes or skin necrosis: No Has patient had a PCN reaction that required hospitalization No Has patient had a PCN reaction occurring within the last 10 years: No If all of the above answers are "NO", then may proceed with Cephalosporin use.    Sulfa Antibiotics Swelling    Lips   Tetanus Immune Globulin     Other reaction(s): fever, vomiting   Tetanus Toxoids Nausea And Vomiting and Other (See Comments)    High fever    Consultations: Cardiology  Procedures:   Discharge Exam: BP (!) 99/54 (BP Location: Left Arm)   Pulse 66   Temp 97.9 F (36.6 C) (Oral)   Resp 18   Ht 5' 10.5" (1.791 m)   Wt 83.9 kg   SpO2 100%   BMI 26.17 kg/m  Physical Exam Constitutional:      Comments: Uncomfortable appearing  HENT:     Head: Normocephalic and atraumatic.     Mouth/Throat:     Mouth: Mucous membranes are moist.  Eyes:     Extraocular Movements: Extraocular movements intact.  Cardiovascular:      Rate and Rhythm: Tachycardia present. Rhythm irregular.  Pulmonary:     Effort: Pulmonary effort is normal. No respiratory distress.     Breath sounds: Normal breath sounds. No wheezing.  Abdominal:     General: Bowel sounds are normal. There is no distension.     Palpations: Abdomen is soft.     Tenderness: There is no abdominal tenderness.  Musculoskeletal:        General: Normal range of motion.     Cervical back: Normal range of motion and neck supple.  Skin:    General: Skin is warm and dry.  Neurological:     General: No focal deficit present.     Mental Status: She is alert.  Psychiatric:        Mood and Affect: Mood normal.      The results of significant diagnostics from this hospitalization (including imaging, microbiology, ancillary and laboratory) are listed below for reference.   Microbiology: No results found for this or any previous visit (from the past 240 hours).   Labs: BNP (last 3 results) No results for input(s): "BNP" in the last 8760 hours. Basic Metabolic Panel: Recent Labs  Lab 05/21/23 1855 05/22/23 0344  NA 138 143  K 3.7 2.8*  CL 104 119*  CO2 19* 17*  GLUCOSE 122* 75  BUN 14 9  CREATININE 1.17* 0.69  CALCIUM 9.5 12.2*  MG 2.1  --    Liver Function Tests: Recent Labs  Lab 05/21/23 1855  AST 40  ALT 17  ALKPHOS 44  BILITOT 1.0  PROT 7.0  ALBUMIN 3.9   No results for input(s): "LIPASE", "AMYLASE" in the last 168 hours. No results for input(s): "AMMONIA" in the last 168 hours. CBC: Recent Labs  Lab 05/21/23 1855 05/22/23 0344  WBC 9.0 7.0  NEUTROABS 6.2  --   HGB 15.1* 10.0*  HCT 44.0 30.2*  MCV 86.1 90.1  PLT 295 175   Cardiac Enzymes: No results for input(s): "CKTOTAL", "CKMB", "CKMBINDEX", "TROPONINI" in the last 168 hours. BNP: Invalid input(s): "POCBNP" CBG: No results for input(s): "GLUCAP" in the last 168 hours. D-Dimer No results for input(s): "DDIMER" in the last 72 hours. Hgb A1c No results for  input(s): "HGBA1C" in the last 72 hours. Lipid Profile No results for input(s): "CHOL", "  HDL", "LDLCALC", "TRIG", "CHOLHDL", "LDLDIRECT" in the last 72 hours. Thyroid function studies Recent Labs    05/21/23 1855  TSH 3.524   Anemia work up No results for input(s): "VITAMINB12", "FOLATE", "FERRITIN", "TIBC", "IRON", "RETICCTPCT" in the last 72 hours. Urinalysis    Component Value Date/Time   COLORURINE STRAW (A) 05/21/2023 2025   APPEARANCEUR CLEAR 05/21/2023 2025   LABSPEC 1.008 05/21/2023 2025   PHURINE 6.0 05/21/2023 2025   GLUCOSEU NEGATIVE 05/21/2023 2025   HGBUR NEGATIVE 05/21/2023 2025   BILIRUBINUR NEGATIVE 05/21/2023 2025   KETONESUR 20 (A) 05/21/2023 2025   PROTEINUR NEGATIVE 05/21/2023 2025   NITRITE NEGATIVE 05/21/2023 2025   LEUKOCYTESUR NEGATIVE 05/21/2023 2025   Sepsis Labs Recent Labs  Lab 05/21/23 1855 05/22/23 0344  WBC 9.0 7.0   Microbiology No results found for this or any previous visit (from the past 240 hours).  Procedures/Studies: MR BRAIN W WO CONTRAST Result Date: 05/22/2023 CLINICAL DATA:  Provided history: History of acoustic neuroma. Additional history provided by the scanning technologist: History of BAHA implant, dizziness, nausea. EXAM: MRI HEAD WITHOUT AND WITH CONTRAST TECHNIQUE: Multiplanar, multiecho pulse sequences of the brain and surrounding structures were obtained without and with intravenous contrast. CONTRAST:  9 mL Vueway intravenous contrast. COMPARISON:  Prior brain MRI examinations 02/03/2018 and earlier. Head CT 05/21/2015. FINDINGS: Brain: No age advanced or lobar predominant parenchymal atrophy. 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 1-2 mm focus of enhancement within the left internal auditory canal fundus, unchanged from the prior brain MRI of 02/03/2018 (series 15, image 8). No cerebellopontine angle or internal auditory canal mass on the right. 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 Small chronic infarcts in the right cerebellar hemisphere, unchanged. Small chronic infarct within the inferior left cerebellar hemisphere, new from the prior MRI. There is no acute infarct. No extra-axial fluid collection. No midline shift. Vascular: Maintained flow voids within the proximal large arterial vessels. Skull and upper cervical spine: No focal worrisome marrow lesion. Sinuses/Orbits: No mass or acute finding within the imaged orbits. Prior bilateral ocular lens replacement. Tiny mucous retention cyst within the right maxillary sinus. IMPRESSION: 1. 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 1-2 mm focus of enhancement within the left internal auditory canal fundus, unchanged from the prior brain MRI of 02/03/2018. This is again favored to reflect postsurgical enhancement. However, a small residual schwannoma is difficult to definitively exclude. 2. Small chronic infarct within the inferior left cerebellar hemisphere, new from the prior MRI. 3. Chronic lacunar infarct in the right caudate nucleus and small chronic infarcts within the right cerebellar hemisphere, unchanged. Electronically Signed   By: Jackey Loge D.O.   On: 05/22/2023 12:26   DG Chest Portable 1 View Result Date: 05/21/2023 CLINICAL DATA:  Shortness of breath EXAM: PORTABLE CHEST 1 VIEW COMPARISON:  05/21/2015 FINDINGS: The heart size and mediastinal contours are within normal limits. Both lungs are clear. The visualized skeletal structures are unremarkable. IMPRESSION: No active disease. Electronically Signed   By: Jasmine Pang M.D.   On: 05/21/2023 21:48     Time coordinating discharge: Over 30 minutes    Lewie Chamber, MD  [facility] 05/23/2023, 3:12 PM

## 2023-05-31 NOTE — Progress Notes (Unsigned)
Electrophysiology Office Note:   Date:  06/01/2023  ID:  RAMIRA LY, DOB [birth year], MRN [id number]  Primary Cardiologist: Lance Muss, MD Primary Heart Failure: None Electrophysiologist: Nobie Putnam, MD      History of Present Illness:   SHATIQUA NGUYEN is a 67 y.o. female with h/o GERD, anxiety, PACs, PVCs and paroxysmal atrial fibrillation who is being seen today for evaluation of her atrial fibrillation at the request of Dr. Dietrich Pates.  Atrial fibrillation first occurred in 2021 in the post-operative setting.  She has had intermittent episodes over the years.  She was only taking oral anticoagulation as needed during episodes. She then presented to the ED on 12/19 complaining of several days of HR>100bpm that was associated with nausea, dizziness and palpitations.  Was found to be in atrial fibrillation on presentation to the ED.  Started on diltiazem in the ED for rate control made her hypotensive.  Plans were made for TEE and cardioversion the next day due to being off daily oral anticoagulation, she spontaneously converted overnight.  Review of systems complete and found to be negative unless listed in HPI.   EP Information / Studies Reviewed:    EKG is not ordered today. EKG from 05/21/23 reviewed which showed AF w/ RVR.      Coronary CTA 10/29/21: 1. Left Main: No significant stenosis   2. LAD: CTFFR 0.93 across lesion in proximal LAD   3. LCX: No significant stenosis   4. RCA: No significant stenosis   IMPRESSION: 1.  CTFFR suggests nonobstructive CAD  Echo 09/06/21: Normal LV size and function.  LVEF 55 to 60%. Normal RV size and function. Mildly dilated left atrium.  Right atrium normal in size. No significant valvular disease.   Risk Assessment/Calculations:    CHA2DS2-VASc Score = 4   This indicates a 4.8% annual risk of stroke. The patient's score is based upon: CHF History: 0 HTN History: 0 Diabetes History: 0 Stroke History: 2 Vascular  Disease History: 0 Age Score: 1 Gender Score: 1             Physical Exam:   VS:  BP 132/72   Pulse 72   Ht 5' 10.5" (1.791 m)   Wt 184 lb 6.4 oz (83.6 kg)   SpO2 96%   BMI 26.08 kg/m    Wt Readings from Last 3 Encounters:  06/01/23 184 lb 6.4 oz (83.6 kg)  05/22/23 185 lb (83.9 kg)  04/22/23 186 lb 12.8 oz (84.7 kg)     GEN: Well nourished, well developed in no acute distress NECK: No JVD; CARDIAC: Normal rate and regular rhythm. RESPIRATORY:  Clear to auscultation without rales, wheezing or rhonchi  ABDOMEN: Soft, non-tender, non-distended EXTREMITIES:  No edema; No deformity   ASSESSMENT AND PLAN:   NOYA GRIFFUS is a 67 y.o. female with h/o hypertension, GERD, anxiety, PACs, PVCs and paroxysmal atrial fibrillation who is being seen today for evaluation of her atrial fibrillation at the request of Dr. Dietrich Pates.  #Paroxysmal atrial fibrillation, symptomatic: -Discussed treatment options today for AF including antiarrhythmic drug therapy and ablation. Discussed risks, recovery and likelihood of success with each treatment strategy. Risk, benefits, and alternatives to EP study and ablation for afib were discussed. These risks include but are not limited to stroke, bleeding, vascular damage, tamponade, perforation, damage to the esophagus, lungs, phrenic nerve and other structures, pulmonary vein stenosis, worsening renal function, coronary vasospasm and death.  Discussed potential need for repeat ablation procedures and antiarrhythmic  drugs after an initial ablation. The patient understands these risk and wishes to proceed.  We will therefore proceed with catheter ablation at the next available time.  Carto, ICE, anesthesia are requested for the procedure.  Will also obtain CT PV protocol prior to the procedure to exclude LAA thrombus and further evaluate atrial anatomy. -Start dronedarone as bridge to ablation.   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 -CHADSVASC score of 4. -Continue Xarelto.   Signed, Nobie Putnam, MD

## 2023-05-31 NOTE — H&P (View-Only) (Signed)
Electrophysiology Office Note:   Date:  06/01/2023  ID:  JOHNNAE KERPER, DOB 19-Apr-1956, MRN [id number]  Primary Cardiologist: Lance Muss, MD Primary Heart Failure: None Electrophysiologist: Nobie Putnam, MD      History of Present Illness:   ARSEMA WERTMAN is a 67 y.o. female with h/o GERD, anxiety, PACs, PVCs and paroxysmal atrial fibrillation who is being seen today for evaluation of her atrial fibrillation at the request of Dr. Dietrich Pates.  Atrial fibrillation first occurred in 2021 in the post-operative setting.  She has had intermittent episodes over the years.  She was only taking oral anticoagulation as needed during episodes. She then presented to the ED on 12/19 complaining of several days of HR>100bpm that was associated with nausea, dizziness and palpitations.  Was found to be in atrial fibrillation on presentation to the ED.  Started on diltiazem in the ED for rate control made her hypotensive.  Plans were made for TEE and cardioversion the next day due to being off daily oral anticoagulation, she spontaneously converted overnight.  Review of systems complete and found to be negative unless listed in HPI.   EP Information / Studies Reviewed:    EKG is not ordered today. EKG from 05/21/23 reviewed which showed AF w/ RVR.      Coronary CTA 10/29/21: 1. Left Main: No significant stenosis   2. LAD: CTFFR 0.93 across lesion in proximal LAD   3. LCX: No significant stenosis   4. RCA: No significant stenosis   IMPRESSION: 1.  CTFFR suggests nonobstructive CAD  Echo 09/06/21: Normal LV size and function.  LVEF 55 to 60%. Normal RV size and function. Mildly dilated left atrium.  Right atrium normal in size. No significant valvular disease.   Risk Assessment/Calculations:    CHA2DS2-VASc Score = 4   This indicates a 4.8% annual risk of stroke. The patient's score is based upon: CHF History: 0 HTN History: 0 Diabetes History: 0 Stroke History: 2 Vascular  Disease History: 0 Age Score: 1 Gender Score: 1             Physical Exam:   VS:  BP 132/72   Pulse 72   Ht 5' 10.5" (1.791 m)   Wt 184 lb 6.4 oz (83.6 kg)   SpO2 96%   BMI 26.08 kg/m    Wt Readings from Last 3 Encounters:  06/01/23 184 lb 6.4 oz (83.6 kg)  05/22/23 185 lb (83.9 kg)  04/22/23 186 lb 12.8 oz (84.7 kg)     GEN: Well nourished, well developed in no acute distress NECK: No JVD; CARDIAC: Normal rate and regular rhythm. RESPIRATORY:  Clear to auscultation without rales, wheezing or rhonchi  ABDOMEN: Soft, non-tender, non-distended EXTREMITIES:  No edema; No deformity   ASSESSMENT AND PLAN:   JEYLIN PLOETZ is a 67 y.o. female with h/o hypertension, GERD, anxiety, PACs, PVCs and paroxysmal atrial fibrillation who is being seen today for evaluation of her atrial fibrillation at the request of Dr. Dietrich Pates.  #Paroxysmal atrial fibrillation, symptomatic: -Discussed treatment options today for AF including antiarrhythmic drug therapy and ablation. Discussed risks, recovery and likelihood of success with each treatment strategy. Risk, benefits, and alternatives to EP study and ablation for afib were discussed. These risks include but are not limited to stroke, bleeding, vascular damage, tamponade, perforation, damage to the esophagus, lungs, phrenic nerve and other structures, pulmonary vein stenosis, worsening renal function, coronary vasospasm and death.  Discussed potential need for repeat ablation procedures and antiarrhythmic  drugs after an initial ablation. The patient understands these risk and wishes to proceed.  We will therefore proceed with catheter ablation at the next available time.  Carto, ICE, anesthesia are requested for the procedure.  Will also obtain CT PV protocol prior to the procedure to exclude LAA thrombus and further evaluate atrial anatomy. -Start dronedarone as bridge to ablation.   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 -CHADSVASC score of 4. -Continue Xarelto.   Signed, Nobie Putnam, MD

## 2023-06-01 ENCOUNTER — Other Ambulatory Visit (HOSPITAL_COMMUNITY): Payer: Self-pay

## 2023-06-01 ENCOUNTER — Ambulatory Visit: Payer: BC Managed Care – PPO | Attending: Cardiology | Admitting: Cardiology

## 2023-06-01 ENCOUNTER — Other Ambulatory Visit: Payer: Self-pay

## 2023-06-01 ENCOUNTER — Encounter: Payer: Self-pay | Admitting: Cardiology

## 2023-06-01 VITALS — BP 132/72 | HR 72 | Ht 70.5 in | Wt 184.4 lb

## 2023-06-01 DIAGNOSIS — I480 Paroxysmal atrial fibrillation: Secondary | ICD-10-CM

## 2023-06-01 DIAGNOSIS — D6869 Other thrombophilia: Secondary | ICD-10-CM

## 2023-06-01 MED ORDER — DRONEDARONE HCL 400 MG PO TABS
400.0000 mg | ORAL_TABLET | Freq: Two times a day (BID) | ORAL | 0 refills | Status: DC
  Filled 2023-06-01: qty 60, 30d supply, fill #0

## 2023-06-01 NOTE — Patient Instructions (Addendum)
Medication Instructions:  Your physician has recommended you make the following change in your medication:  1) START taking Multaq 400 mg twice daily  *If you need a refill on your cardiac medications before your next appointment, please call your pharmacy*   Lab Work: BMET and CBC   Testing/Procedures: Cardiac CT Your physician has recommended that you have an ablation. Catheter ablation is a medical procedure used to treat some cardiac arrhythmias (irregular heartbeats). During catheter ablation, a long, thin, flexible tube is put into a blood vessel in your groin (upper thigh), or neck. This tube is called an ablation catheter. It is then guided to your heart through the blood vessel. Radio frequency waves destroy small areas of heart tissue where abnormal heartbeats may cause an arrhythmia to start. Please see the instruction sheet given to you today.  We will call you to schedule your CT scan. It will be done about two weeks prior to your ablation.  Ablation Your physician has recommended that you have an ablation. Catheter ablation is a medical procedure used to treat some cardiac arrhythmias (irregular heartbeats). During catheter ablation, a long, thin, flexible tube is put into a blood vessel in your groin (upper thigh), or neck. This tube is called an ablation catheter. It is then guided to your heart through the blood vessel. Radio frequency waves destroy small areas of heart tissue where abnormal heartbeats may cause an arrhythmia to start. Please see the instruction sheet given to you today.  Follow-Up: At [facility], you and your health needs are our priority.  As part of our continuing mission to provide you with exceptional heart care, we have created designated Provider Care Teams.  These Care Teams include your primary Cardiologist (physician) and Advanced Practice Providers (APPs -  Physician Assistants and Nurse Practitioners) who all work together to provide you  with the care you need, when you need it.   Your next appointment:   We will call you to arrange your follow up appointments

## 2023-06-02 LAB — CBC
Hematocrit: 41 % (ref 34.0–46.6)
Hemoglobin: 13.6 g/dL (ref 11.1–15.9)
MCH: 29.6 pg (ref 26.6–33.0)
MCHC: 33.2 g/dL (ref 31.5–35.7)
MCV: 89 fL (ref 79–97)
Platelets: 295 10*3/uL (ref 150–450)
RBC: 4.59 x10E6/uL (ref 3.77–5.28)
RDW: 12.9 % (ref 11.7–15.4)
WBC: 5.6 10*3/uL (ref 3.4–10.8)

## 2023-06-02 LAB — BASIC METABOLIC PANEL
BUN/Creatinine Ratio: 16 (ref 12–28)
BUN: 15 mg/dL (ref 8–27)
CO2: 23 mmol/L (ref 20–29)
Calcium: 8.7 mg/dL (ref 8.7–10.3)
Chloride: 103 mmol/L (ref 96–106)
Creatinine, Ser: 0.92 mg/dL (ref 0.57–1.00)
Glucose: 101 mg/dL — ABNORMAL HIGH (ref 70–99)
Potassium: 4.3 mmol/L (ref 3.5–5.2)
Sodium: 141 mmol/L (ref 134–144)
eGFR: 69 mL/min/{1.73_m2} (ref >59)

## 2023-06-04 ENCOUNTER — Other Ambulatory Visit (HOSPITAL_COMMUNITY): Payer: Self-pay

## 2023-06-12 ENCOUNTER — Other Ambulatory Visit: Payer: Self-pay | Admitting: Internal Medicine

## 2023-06-13 ENCOUNTER — Other Ambulatory Visit (HOSPITAL_COMMUNITY): Payer: Self-pay

## 2023-06-13 MED ORDER — PROGESTERONE MICRONIZED 100 MG PO CAPS
100.0000 mg | ORAL_CAPSULE | Freq: Every day | ORAL | 1 refills | Status: DC
  Filled 2023-06-13 (×2): qty 30, 30d supply, fill #0
  Filled 2023-07-15: qty 30, 30d supply, fill #1
  Filled 2023-08-17: qty 30, 30d supply, fill #2
  Filled 2023-09-16: qty 30, 30d supply, fill #3
  Filled 2023-10-15: qty 30, 30d supply, fill #4
  Filled 2023-11-09: qty 30, 30d supply, fill #5

## 2023-06-15 ENCOUNTER — Other Ambulatory Visit (HOSPITAL_COMMUNITY): Payer: Self-pay

## 2023-06-15 ENCOUNTER — Telehealth: Payer: Self-pay | Admitting: Cardiology

## 2023-06-15 ENCOUNTER — Other Ambulatory Visit: Payer: Self-pay

## 2023-06-15 ENCOUNTER — Telehealth (HOSPITAL_COMMUNITY): Payer: Self-pay | Admitting: *Deleted

## 2023-06-15 MED ORDER — METOPROLOL SUCCINATE ER 25 MG PO TB24
25.0000 mg | ORAL_TABLET | Freq: Every day | ORAL | 3 refills | Status: DC
  Filled 2023-06-15: qty 30, 30d supply, fill #0
  Filled 2023-08-17: qty 30, 30d supply, fill #1
  Filled 2023-09-16: qty 30, 30d supply, fill #2

## 2023-06-15 NOTE — Telephone Encounter (Signed)
 Spoke with the patient who would like to know if she should refill her metoprolol  and her Xarelto . I advised that she will continue on these medications after her ablation. They may be discontinued at some point but she will need the xarelto  for at least 3 months post ablation. Patient verbalized understanding

## 2023-06-15 NOTE — Telephone Encounter (Signed)
 Pt has upcoming ablation and wants to know will she still continue her same medications or will some of them be discontinued. Pt would like to know before refilling her medications because she is almost out

## 2023-06-15 NOTE — Telephone Encounter (Signed)
 Patient calling about her upcoming cardiac imaging study; pt verbalizes understanding of appt date/time, parking situation and where to check in, pre-test NPO status  and verified current allergies; name and call back number provided for further questions should they arise  Chantal Requena RN Navigator Cardiac Imaging [facility] [phone] office [phone] cell  Patient aware to arrive at 3:30 PM.

## 2023-06-16 ENCOUNTER — Other Ambulatory Visit (HOSPITAL_COMMUNITY): Payer: Self-pay

## 2023-06-16 ENCOUNTER — Ambulatory Visit (HOSPITAL_COMMUNITY)
Admission: RE | Admit: 2023-06-16 | Discharge: 2023-06-16 | Disposition: A | Discharge status: Home / Self Care | Payer: BC Managed Care – PPO | Source: Ambulatory Visit | Attending: Cardiology | Admitting: Cardiology

## 2023-06-16 ENCOUNTER — Other Ambulatory Visit: Payer: Self-pay

## 2023-06-16 DIAGNOSIS — I480 Paroxysmal atrial fibrillation: Secondary | ICD-10-CM | POA: Insufficient documentation

## 2023-06-16 DIAGNOSIS — I2510 Atherosclerotic heart disease of native coronary artery without angina pectoris: Secondary | ICD-10-CM | POA: Diagnosis not present

## 2023-06-16 MED ORDER — IOHEXOL 350 MG/ML SOLN
100.0000 mL | Freq: Once | INTRAVENOUS | Status: AC | PRN
  Administered 2023-06-16: 100 mL via INTRAVENOUS

## 2023-06-19 NOTE — Pre-Procedure Instructions (Signed)
Instructed patient on the following items: Arrival time 0800 Nothing to eat or drink after midnight No meds AM of procedure Responsible person to drive you home and stay with you for 24 hrs  Have you missed any doses of anti-coagulant Xarelto- takes once a day, hasn't missed any doses

## 2023-06-22 ENCOUNTER — Ambulatory Visit (HOSPITAL_COMMUNITY): Payer: BC Managed Care – PPO | Admitting: Anesthesiology

## 2023-06-22 ENCOUNTER — Other Ambulatory Visit: Payer: Self-pay

## 2023-06-22 ENCOUNTER — Ambulatory Visit (HOSPITAL_COMMUNITY)
Admission: RE | Admit: 2023-06-22 | Discharge: 2023-06-22 | Disposition: A | Discharge status: Home / Self Care | Payer: BC Managed Care – PPO | Attending: Cardiology | Admitting: Cardiology

## 2023-06-22 ENCOUNTER — Encounter (HOSPITAL_COMMUNITY)
Admission: RE | Disposition: A | Discharge status: Home / Self Care | Payer: Self-pay | Source: Home / Self Care | Attending: Cardiology

## 2023-06-22 DIAGNOSIS — K219 Gastro-esophageal reflux disease without esophagitis: Secondary | ICD-10-CM | POA: Insufficient documentation

## 2023-06-22 DIAGNOSIS — D6869 Other thrombophilia: Secondary | ICD-10-CM | POA: Insufficient documentation

## 2023-06-22 DIAGNOSIS — Z79899 Other long term (current) drug therapy: Secondary | ICD-10-CM | POA: Insufficient documentation

## 2023-06-22 DIAGNOSIS — F419 Anxiety disorder, unspecified: Secondary | ICD-10-CM | POA: Diagnosis not present

## 2023-06-22 DIAGNOSIS — Z7901 Long term (current) use of anticoagulants: Secondary | ICD-10-CM | POA: Diagnosis not present

## 2023-06-22 DIAGNOSIS — I10 Essential (primary) hypertension: Secondary | ICD-10-CM | POA: Insufficient documentation

## 2023-06-22 DIAGNOSIS — I480 Paroxysmal atrial fibrillation: Secondary | ICD-10-CM | POA: Diagnosis not present

## 2023-06-22 DIAGNOSIS — E119 Type 2 diabetes mellitus without complications: Secondary | ICD-10-CM | POA: Diagnosis not present

## 2023-06-22 HISTORY — PX: ATRIAL FIBRILLATION ABLATION: EP1191

## 2023-06-22 SURGERY — ATRIAL FIBRILLATION ABLATION
Anesthesia: General

## 2023-06-22 MED ORDER — ONDANSETRON HCL 4 MG/2ML IJ SOLN
INTRAMUSCULAR | Status: AC
Start: 2023-06-22 — End: ?
  Filled 2023-06-22: qty 2

## 2023-06-22 MED ORDER — PHENYLEPHRINE HCL-NACL 20-0.9 MG/250ML-% IV SOLN
INTRAVENOUS | Status: DC | PRN
  Administered 2023-06-22: 50 ug/min via INTRAVENOUS

## 2023-06-22 MED ORDER — SODIUM CHLORIDE 0.9% FLUSH: 3.0000 mL | INTRAVENOUS | Status: DC | PRN

## 2023-06-22 MED ORDER — GLYCOPYRROLATE PF 0.2 MG/ML IJ SOSY
PREFILLED_SYRINGE | INTRAMUSCULAR | Status: DC | PRN
  Administered 2023-06-22: .2 mg via INTRAVENOUS

## 2023-06-22 MED ORDER — ROCURONIUM BROMIDE 10 MG/ML (PF) SYRINGE
PREFILLED_SYRINGE | INTRAVENOUS | Status: DC | PRN
  Administered 2023-06-22: 60 mg via INTRAVENOUS
  Administered 2023-06-22 (×2): 20 mg via INTRAVENOUS

## 2023-06-22 MED ORDER — SODIUM CHLORIDE 0.9% FLUSH: 3.0000 mL | Freq: Two times a day (BID) | INTRAVENOUS | Status: DC

## 2023-06-22 MED ORDER — PROTAMINE SULFATE 10 MG/ML IV SOLN
INTRAVENOUS | Status: AC
Start: 2023-06-22 — End: ?
  Filled 2023-06-22: qty 5

## 2023-06-22 MED ORDER — SODIUM CHLORIDE 0.9 % IV SOLN: INTRAVENOUS | Status: DC

## 2023-06-22 MED ORDER — SUGAMMADEX SODIUM 200 MG/2ML IV SOLN
INTRAVENOUS | Status: DC | PRN
  Administered 2023-06-22: 160 mg via INTRAVENOUS

## 2023-06-22 MED ORDER — VANCOMYCIN HCL 1000 MG IV SOLR
INTRAVENOUS | Status: DC | PRN
  Administered 2023-06-22: 1000 mg via INTRAVENOUS

## 2023-06-22 MED ORDER — RIVAROXABAN 20 MG PO TABS
20.0000 mg | ORAL_TABLET | Freq: Once | ORAL | Status: AC
  Administered 2023-06-22: 20 mg via ORAL
  Filled 2023-06-22: qty 1

## 2023-06-22 MED ORDER — LIDOCAINE 2% (20 MG/ML) 5 ML SYRINGE
INTRAMUSCULAR | Status: DC | PRN
  Administered 2023-06-22: 40 mg via INTRAVENOUS

## 2023-06-22 MED ORDER — HEPARIN (PORCINE) IN NACL 1000-0.9 UT/500ML-% IV SOLN
INTRAVENOUS | Status: DC | PRN
  Administered 2023-06-22 (×3): 500 mL

## 2023-06-22 MED ORDER — HEPARIN SODIUM (PORCINE) 1000 UNIT/ML IJ SOLN
INTRAMUSCULAR | Status: AC
  Filled 2023-06-22: qty 10

## 2023-06-22 MED ORDER — HEPARIN SODIUM (PORCINE) 1000 UNIT/ML IJ SOLN
INTRAMUSCULAR | Status: DC | PRN
  Administered 2023-06-22: 14000 [IU] via INTRAVENOUS

## 2023-06-22 MED ORDER — ONDANSETRON HCL 4 MG/2ML IJ SOLN
INTRAMUSCULAR | Status: DC | PRN
  Administered 2023-06-22: 4 mg via INTRAVENOUS

## 2023-06-22 MED ORDER — LACTATED RINGERS IV SOLN: INTRAVENOUS | Status: DC | PRN

## 2023-06-22 MED ORDER — SODIUM CHLORIDE 0.9 % IV SOLN
250.0000 mL | INTRAVENOUS | Status: DC | PRN
Start: 2023-06-22 — End: 2023-06-23

## 2023-06-22 MED ORDER — ACETAMINOPHEN 325 MG PO TABS
650.0000 mg | ORAL_TABLET | ORAL | Status: DC | PRN
  Administered 2023-06-22: 650 mg via ORAL
  Filled 2023-06-22: qty 2

## 2023-06-22 MED ORDER — PROPOFOL 10 MG/ML IV BOLUS
INTRAVENOUS | Status: DC | PRN
  Administered 2023-06-22: 140 mg via INTRAVENOUS

## 2023-06-22 MED ORDER — PROTAMINE SULFATE 10 MG/ML IV SOLN
INTRAVENOUS | Status: DC | PRN
  Administered 2023-06-22: 35 mg via INTRAVENOUS

## 2023-06-22 MED ORDER — VANCOMYCIN HCL IN DEXTROSE 1-5 GM/200ML-% IV SOLN
INTRAVENOUS | Status: AC
  Filled 2023-06-22: qty 200

## 2023-06-22 MED ORDER — PROPOFOL 500 MG/50ML IV EMUL
INTRAVENOUS | Status: DC | PRN
  Administered 2023-06-22: 95 ug/kg/min via INTRAVENOUS
  Administered 2023-06-22: 105 ug/kg/min via INTRAVENOUS

## 2023-06-22 MED ORDER — ATROPINE SULFATE 0.4 MG/ML IV SOLN
INTRAVENOUS | Status: DC | PRN
  Administered 2023-06-22: 1 mg via INTRAVENOUS

## 2023-06-22 MED ORDER — ONDANSETRON HCL 4 MG/2ML IJ SOLN: 4.0000 mg | Freq: Four times a day (QID) | INTRAMUSCULAR | Status: DC | PRN

## 2023-06-22 SURGICAL SUPPLY — 21 items
BAG SNAP BAND KOVER 36X36 (MISCELLANEOUS) IMPLANT
CABLE PFA RX CATH CONN (CABLE) IMPLANT
CATH 8FR REPROCESSED SOUNDSTAR (CATHETERS) ×1 IMPLANT
CATH 8FR SOUNDSTAR REPROCESSED (CATHETERS) IMPLANT
CATH FARAWAVE ABLATION 31 (CATHETERS) IMPLANT
CATH OCTARAY 2.0 F 3-3-3-3-3 (CATHETERS) IMPLANT
CATH SOUNDSTAR ECO 8FR (CATHETERS) IMPLANT
CATH WEBSTER BI DIR CS D-F CRV (CATHETERS) IMPLANT
CLOSURE PERCLOSE PROSTYLE (VASCULAR PRODUCTS) IMPLANT
COVER SWIFTLINK CONNECTOR (BAG) ×2 IMPLANT
DEVICE CLOSURE MYNXGRIP 6/7F (Vascular Products) IMPLANT
GUIDEWIRE INQWIRE 1.5J.035X260 (WIRE) IMPLANT
INQWIRE 1.5J .035X260CM (WIRE) ×1
KIT VERSACROSS CNCT FARADRIVE (KITS) IMPLANT
PACK EP LF (CUSTOM PROCEDURE TRAY) ×2 IMPLANT
PAD DEFIB RADIO PHYSIO CONN (PAD) ×2 IMPLANT
SHEATH FARADRIVE STEERABLE (SHEATH) IMPLANT
SHEATH FAST CATH 12F 12CM (SHEATH) IMPLANT
SHEATH PINNACLE 8F 10CM (SHEATH) IMPLANT
SHEATH PINNACLE 9F 10CM (SHEATH) IMPLANT
SHEATH PROBE COVER 6X72 (BAG) IMPLANT

## 2023-06-22 NOTE — Discharge Instructions (Signed)

## 2023-06-22 NOTE — Anesthesia Preprocedure Evaluation (Addendum)
Anesthesia Evaluation  Patient identified by MRN, date of birth, ID band Patient awake    Reviewed: Allergy & Precautions, NPO status , Patient's Chart, lab work & pertinent test results  History of Anesthesia Complications (+) PONV and history of anesthetic complications  Airway Mallampati: III  TM Distance: >3 FB Neck ROM: Full    Dental  (+) Dental Advisory Given, Teeth Intact   Pulmonary neg shortness of breath, neg sleep apnea, neg COPD, neg recent URI   breath sounds clear to auscultation       Cardiovascular hypertension, Pt. on medications and Pt. on home beta blockers + dysrhythmias Atrial Fibrillation  Rhythm:Regular  1. Left ventricular ejection fraction, by estimation, is 55 to 60%. The  left ventricle has normal function. The left ventricle has no regional  wall motion abnormalities. Left ventricular diastolic parameters were  normal.   2. Right ventricular systolic function is normal. The right ventricular  size is normal.   3. Left atrial size was mildly dilated.   4. The mitral valve is normal in structure. Trivial mitral valve  regurgitation. No evidence of mitral stenosis.   5. The aortic valve is tricuspid. There is mild calcification of the  aortic valve. Aortic valve regurgitation is not visualized. Aortic valve  sclerosis/calcification is present, without any evidence of aortic  stenosis.   6. The inferior vena cava is normal in size with greater than 50%  respiratory variability, suggesting right atrial pressure of 3 mmHg.     Neuro/Psych  Headaches  Anxiety      Neuromuscular disease    GI/Hepatic ,GERD  ,,  Endo/Other  diabetesHypothyroidism    Renal/GU      Musculoskeletal  (+) Arthritis ,    Abdominal   Peds  Hematology Lab Results      Component                Value               Date                      WBC                      5.6                 06/01/2023                HGB                       13.6                06/01/2023                HCT                      41.0                06/01/2023                MCV                      89                  06/01/2023                PLT  295                 06/01/2023              Anesthesia Other Findings   Reproductive/Obstetrics                             Anesthesia Physical Anesthesia Plan  ASA: 3  Anesthesia Plan: General   Post-op Pain Management: Minimal or no pain anticipated   Induction: Intravenous  PONV Risk Score and Plan: 4 or greater and Ondansetron, Dexamethasone, Propofol infusion and TIVA  Airway Management Planned: Oral ETT  Additional Equipment: None  Intra-op Plan:   Post-operative Plan: Extubation in OR  Informed Consent: I have reviewed the patients History and Physical, chart, labs and discussed the procedure including the risks, benefits and alternatives for the proposed anesthesia with the patient or authorized representative who has indicated his/her understanding and acceptance.     Dental advisory given  Plan Discussed with: CRNA  Anesthesia Plan Comments:         Anesthesia Quick Evaluation

## 2023-06-22 NOTE — Transfer of Care (Signed)
Immediate Anesthesia Transfer of Care Note  Patient: Kristin Bradford  Procedure(s) Performed: ATRIAL FIBRILLATION ABLATION  Patient Location: PACU and Cath Lab  Anesthesia Type:General  Level of Consciousness: awake, drowsy, patient cooperative, and responds to stimulation  Airway & Oxygen Therapy: Patient Spontanous Breathing and Patient connected to face mask oxygen  Post-op Assessment: Report given to RN and Post -op Vital signs reviewed and stable  Post vital signs: Reviewed and stable  Last Vitals:  Vitals Value Taken Time  BP 126/102 06/22/23 1404  Temp    Pulse 61 06/22/23 1407  Resp 14 06/22/23 1407  SpO2 98 % 06/22/23 1407  Vitals shown include unfiled device data.  Last Pain:  Vitals:   06/22/23 0930  TempSrc:   PainSc: 0-No pain         Complications: There were no known notable events for this encounter.

## 2023-06-22 NOTE — Interval H&P Note (Signed)
History and Physical Interval Note:  06/22/2023 10:58 AM  Kristin Bradford  has presented today for surgery, with the diagnosis of symptomatic paroxysmal atrial fibrillation.  The various methods of treatment have been discussed with the patient and family. After consideration of risks, benefits and other options for treatment, the patient has consented to  Procedure(s): ATRIAL FIBRILLATION ABLATION (N/A) as a surgical intervention.  The patient's history has been reviewed, patient examined, no change in status, stable for surgery.  I have reviewed the patient's chart and labs.  Questions were answered to the patient's satisfaction.     Nobie Putnam

## 2023-06-22 NOTE — Progress Notes (Signed)
Pt c/o nausea post ambulation. Having pt lay down before getting dressed, continue to monitor

## 2023-06-22 NOTE — Anesthesia Postprocedure Evaluation (Signed)
Anesthesia Post Note  Patient: ADOLPH PLATANIA  Procedure(s) Performed: ATRIAL FIBRILLATION ABLATION     Patient location during evaluation: PACU Anesthesia Type: General Level of consciousness: awake and alert Pain management: pain level controlled Vital Signs Assessment: post-procedure vital signs reviewed and stable Respiratory status: spontaneous breathing, nonlabored ventilation and respiratory function stable Cardiovascular status: blood pressure returned to baseline and stable Postop Assessment: no apparent nausea or vomiting Anesthetic complications: no   There were no known notable events for this encounter.  Last Vitals:  Vitals:   06/22/23 1435 06/22/23 1436  BP: (!) 96/55 (!) 96/55  Pulse: (!) 56 (!) 56  Resp: 13 15  Temp:  36.6 C  SpO2: 96% 96%    Last Pain:  Vitals:   06/22/23 1436  TempSrc: Temporal  PainSc: 0-No pain                 Collene Schlichter

## 2023-06-22 NOTE — Anesthesia Procedure Notes (Signed)
Procedure Name: Intubation Date/Time: 06/22/2023 11:59 AM  Performed by: Ayesha Rumpf, CRNAPre-anesthesia Checklist: Patient identified, Emergency Drugs available, Suction available and Patient being monitored Patient Re-evaluated:Patient Re-evaluated prior to induction Oxygen Delivery Method: Circle System Utilized Preoxygenation: Pre-oxygenation with 100% oxygen Induction Type: IV induction Ventilation: Mask ventilation without difficulty Laryngoscope Size: Glidescope and 3 Grade View: Grade I Tube type: Oral Tube size: 7.0 mm Number of attempts: 1 Airway Equipment and Method: Stylet and Oral airway Placement Confirmation: ETT inserted through vocal cords under direct vision, positive ETCO2 and breath sounds checked- equal and bilateral Secured at: 21 cm Tube secured with: Tape Dental Injury: Teeth and Oropharynx as per pre-operative assessment

## 2023-06-23 ENCOUNTER — Encounter (HOSPITAL_COMMUNITY): Payer: Self-pay | Admitting: Cardiology

## 2023-06-23 LAB — POCT ACTIVATED CLOTTING TIME
Activated Clotting Time: 366 s
Activated Clotting Time: 360 s

## 2023-06-24 ENCOUNTER — Encounter: Payer: Self-pay | Admitting: General Practice

## 2023-06-26 ENCOUNTER — Telehealth: Payer: Self-pay | Admitting: Cardiology

## 2023-06-26 NOTE — Telephone Encounter (Signed)
Spoke with the patient who reports pain on both sides of her groin at her incision sites. They are well healed with no abnormal coloring or drainage. She denies any knots at the sites. Advised to apply some ice and continue with tylenol. She will call back if symptoms do not improve or worsen.

## 2023-06-26 NOTE — Telephone Encounter (Signed)
Patient is returning phone call.

## 2023-06-26 NOTE — Telephone Encounter (Signed)
Left message for patient to call back

## 2023-06-26 NOTE — Telephone Encounter (Signed)
Patient called in to reschedule her 3 month follow up appt from Otilio Saber to Dr. Jimmey Ralph.   While speaking with patient to get her rescheduled, she states her groin is still very sore. She states there is no bruising or redness, just sore. She has been taking tylenol for pain. She thought about trying an ice or heat pack, but doesn't want to do the wrong thing. She would like to know how long this is going to last. I let her know that I would get this message to you - she's looking forward to hearing from you!

## 2023-06-27 ENCOUNTER — Other Ambulatory Visit (HOSPITAL_COMMUNITY): Payer: Self-pay

## 2023-06-29 ENCOUNTER — Other Ambulatory Visit (HOSPITAL_BASED_OUTPATIENT_CLINIC_OR_DEPARTMENT_OTHER): Payer: Self-pay

## 2023-06-29 ENCOUNTER — Other Ambulatory Visit (HOSPITAL_COMMUNITY): Payer: Self-pay

## 2023-06-29 MED ORDER — ROSUVASTATIN CALCIUM 5 MG PO TABS
5.0000 mg | ORAL_TABLET | Freq: Every day | ORAL | 1 refills | Status: DC
  Filled 2023-06-29: qty 30, 30d supply, fill #0
  Filled 2023-07-27 – 2023-07-28 (×2): qty 30, 30d supply, fill #1
  Filled 2023-08-17 – 2023-08-20 (×2): qty 30, 30d supply, fill #2
  Filled 2023-10-11: qty 30, 30d supply, fill #3

## 2023-07-02 ENCOUNTER — Other Ambulatory Visit (HOSPITAL_COMMUNITY): Payer: Self-pay

## 2023-07-03 ENCOUNTER — Other Ambulatory Visit (HOSPITAL_COMMUNITY): Payer: Self-pay

## 2023-07-16 ENCOUNTER — Other Ambulatory Visit (HOSPITAL_COMMUNITY): Payer: Self-pay

## 2023-07-20 ENCOUNTER — Ambulatory Visit (HOSPITAL_COMMUNITY)
Admission: RE | Admit: 2023-07-20 | Discharge: 2023-07-20 | Disposition: A | Discharge status: Home / Self Care | Payer: BC Managed Care – PPO | Source: Ambulatory Visit | Attending: Physician Assistant | Admitting: Physician Assistant

## 2023-07-20 ENCOUNTER — Encounter (HOSPITAL_COMMUNITY): Payer: Self-pay | Admitting: Physician Assistant

## 2023-07-20 ENCOUNTER — Other Ambulatory Visit (HOSPITAL_COMMUNITY): Payer: Self-pay

## 2023-07-20 VITALS — BP 124/82 | HR 73 | Ht 70.0 in | Wt 187.2 lb

## 2023-07-20 DIAGNOSIS — D6869 Other thrombophilia: Secondary | ICD-10-CM | POA: Diagnosis not present

## 2023-07-20 DIAGNOSIS — I480 Paroxysmal atrial fibrillation: Secondary | ICD-10-CM | POA: Diagnosis not present

## 2023-07-20 DIAGNOSIS — K219 Gastro-esophageal reflux disease without esophagitis: Secondary | ICD-10-CM | POA: Diagnosis not present

## 2023-07-20 DIAGNOSIS — Z7901 Long term (current) use of anticoagulants: Secondary | ICD-10-CM | POA: Diagnosis not present

## 2023-07-20 NOTE — Progress Notes (Signed)
 Primary Care Physician: Kristin Raider, MD Primary Cardiologist: Lance Muss, MD Electrophysiologist: Nobie Putnam, MD  Referring Physician: Dr Venora Maples is a 68 y.o. female with a history of GERD, PVCs, atrial fibrillation who presents for follow up in the [facility]. Atrial fibrillation first occurred in 2021 in the post-operative setting.  She has had intermittent episodes over the years.  She then presented to the ED on 12/19 complaining of several days of HR>100bpm that was associated with nausea, dizziness and palpitations.  Was found to be in atrial fibrillation on presentation to the ED. She was seen by Dr Jimmey Ralph and started on Multaq as a bridge to ablation. She is s/p afib ablation with Dr Jimmey Ralph on 06/22/23. Patient is on Xarelto for stroke prevention.   Patient presents today for follow up for atrial fibrillation. Patient reports that she has done well since the ablation with no interim symptoms of afib. She has been walking during her lunch break at work and feels her energy level has improved but is not back to baseline yet. She did have groin tenderness on the left side but this has resolved.   Today, she denies symptoms of palpitations, chest pain, shortness of breath, orthopnea, PND, lower extremity edema, dizziness, presyncope, syncope, snoring, daytime somnolence, bleeding, or neurologic sequela. The patient is tolerating medications without difficulties and is otherwise without complaint today.    Atrial Fibrillation Risk Factors:  she does not have symptoms or diagnosis of sleep apnea. she does not have a history of rheumatic fever.   Atrial Fibrillation Management history:  Previous antiarrhythmic drugs: Multaq Previous cardioversions: none Previous ablations: 06/22/23 Anticoagulation history: Xarelto   ROS- All systems are reviewed and negative except as per the HPI above.  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   Acquired deafness of left ear    S/P  RESECTION ACOUSTIC NEUROMA  2004   Acquired facial asymmetry    post surgery   Angio-edema    Anxiety 05/21/2023   Atrial fibrillation (HCC) 05/18/2015   Fibroids 03/30/2019   GERD (gastroesophageal reflux disease)    Gestational diabetes mellitus    gestastional   Hyperlipidemia    Hypothyroidism    hx of no meds now   IC (interstitial cystitis)    Migraine    "q several months" (05/22/2015)   Neuromuscular disorder (HCC)    tremor to right side when upset    PONV (postoperative nausea and vomiting)    atrial fib   Premature atrial contractions    PVC's (premature ventricular contractions)    Recurrent oral ulcers 06/30/2019   S/P myomectomy 03/30/2019   Seasonal and perennial allergic rhinitis 06/30/2019   SVT (supraventricular tachycardia) (HCC)    Syncope    Urticaria     Current Outpatient Medications  Medication Sig Dispense Refill   ALPRAZolam (XANAX) 0.5 MG tablet Take 0.25 mg by mouth daily as needed for anxiety.   0   COLLAGEN PO Take 0.5 Scoops by mouth daily.     meloxicam (MOBIC) 7.5 MG tablet Take 1 tablet (7.5 mg total) by mouth 2 (two) times daily for 2 weeks and then as needed. (Patient taking differently: Take 7.5 mg by mouth 2 (two) times daily as needed for pain.) 60 tablet 0   metoprolol succinate (TOPROL-XL) 25 MG 24 hr tablet Take 1 tablet (25 mg total) by mouth daily. (Patient taking differently: Take 12.5 mg by  mouth daily.) 90 tablet 3   metoprolol tartrate (LOPRESSOR) 25 MG tablet Take 1/2 tablet (12.5 mg total) by mouth daily as needed for breakthrough palpitations (Patient taking differently: Take 12.5 mg by mouth daily.) 45 tablet 3   nystatin-triamcinolone (MYCOLOG II) cream APPLY TO THE AFFECTED AREAS 2 TIMES PER DAY IN THE MORNING AND EVENING (Patient taking differently: Apply 1 Application topically daily as needed (for rash).) 60 g 3   phenazopyridine (PYRIDIUM) 100  MG tablet Take 1 tablet by mouth 3 times daily as needed 30 tablet 3   polyethylene glycol (MIRALAX / GLYCOLAX) packet Take 17 g by mouth daily.     Polyethylene Glycol 400 (BLINK TEARS OP) Place 1 drop into both eyes daily.     progesterone (PROMETRIUM) 100 MG capsule Take 1 capsule (100 mg total) by mouth daily. 90 capsule 1   rivaroxaban (XARELTO) 20 MG TABS tablet Take 1 tablet (20 mg total) by mouth daily with supper. 90 tablet 1   rosuvastatin (CRESTOR) 5 MG tablet Take 1 tablet (5 mg total) by mouth daily. 100 tablet 1   venlafaxine XR (EFFEXOR-XR) 37.5 MG 24 hr capsule Take 1 capsule (37.5 mg total) by mouth daily with food. 90 capsule 3   No current facility-administered medications for this encounter.   Facility-Administered Medications Ordered in Other Encounters  Medication Dose Route Frequency Provider Last Rate Last Admin   bupivacaine (MARCAINE) 0.5 % 10 mL, triamcinolone acetonide (KENALOG-40) 40 mg injection   Subcutaneous Once Jethro Bolus, MD       bupivacaine (MARCAINE) 0.5 % 15 mL, phenazopyridine (PYRIDIUM) 400 mg bladder mixture   Bladder Instillation Once Jethro Bolus, MD        Physical Exam: BP 124/82   Pulse 73   Ht 5\' 10"  (1.778 m)   Wt 84.9 kg   BMI 26.86 kg/m   GEN: Well nourished, well developed in no acute distress CARDIAC: Regular rate and rhythm, no murmurs, rubs, gallops RESPIRATORY:  Clear to auscultation without rales, wheezing or rhonchi  ABDOMEN: Soft, non-tender, non-distended EXTREMITIES:  No edema; No deformity   Wt Readings from Last 3 Encounters:  07/20/23 84.9 kg  06/22/23 83.9 kg  06/01/23 83.6 kg     EKG today demonstrates  SR Vent. rate 73 BPM PR interval * ms QRS duration 84 ms QT/QTcB 388/427 ms  Echo 09/06/21 demonstrated   1. Left ventricular ejection fraction, by estimation, is 55 to 60%. The  left ventricle has normal function. The left ventricle has no regional  wall motion abnormalities. Left ventricular  diastolic parameters were  normal.   2. Right ventricular systolic function is normal. The right ventricular  size is normal.   3. Left atrial size was mildly dilated.   4. The mitral valve is normal in structure. Trivial mitral valve  regurgitation. No evidence of mitral stenosis.   5. The aortic valve is tricuspid. There is mild calcification of the  aortic valve. Aortic valve regurgitation is not visualized. Aortic valve  sclerosis/calcification is present, without any evidence of aortic  stenosis.   6. The inferior vena cava is normal in size with greater than 50%  respiratory variability, suggesting right atrial pressure of 3 mmHg.    CHA2DS2-VASc Score = 5  The patient's score is based upon: CHF History: 0 HTN History: 0 Diabetes History: 0 Stroke History: 2 Vascular Disease History: 1 Age Score: 1 Gender Score: 1       ASSESSMENT AND PLAN: Paroxysmal Atrial Fibrillation (ICD10:  I48.0) The patient's CHA2DS2-VASc score is 5, indicating a 7.2% annual risk of stroke.   S/p afib ablation 06/22/23, now off Multaq. Patient appears to be maintaining SR Continue Toprol 12.5 mg daily Continue Xarelto 20 mg daily with no missed doses for 3 months post ablation.  Secondary Hypercoagulable State (ICD10:  D68.69) The patient is at significant risk for stroke/thromboembolism based upon her CHA2DS2-VASc Score of 5.  Continue Rivaroxaban (Xarelto).     Follow up with Dr Jimmey Ralph as scheduled.       Jorja Loa PA-C [facility] [address] [place], [place] [address] [phone]

## 2023-07-28 ENCOUNTER — Other Ambulatory Visit (HOSPITAL_COMMUNITY): Payer: Self-pay

## 2023-08-05 ENCOUNTER — Other Ambulatory Visit (HOSPITAL_COMMUNITY): Payer: Self-pay

## 2023-08-05 DIAGNOSIS — R102 Pelvic and perineal pain: Secondary | ICD-10-CM | POA: Diagnosis not present

## 2023-08-05 DIAGNOSIS — R8279 Other abnormal findings on microbiological examination of urine: Secondary | ICD-10-CM | POA: Diagnosis not present

## 2023-08-05 DIAGNOSIS — N3010 Interstitial cystitis (chronic) without hematuria: Secondary | ICD-10-CM | POA: Diagnosis not present

## 2023-08-05 MED ORDER — ESTRADIOL 0.1 MG/GM VA CREA
TOPICAL_CREAM | VAGINAL | 3 refills | Status: AC
  Filled 2023-08-05: qty 42.5, 30d supply, fill #0

## 2023-08-05 MED ORDER — DIAZEPAM 10 MG PO TABS
ORAL_TABLET | ORAL | 1 refills | Status: AC
Start: 2023-08-05 — End: ?
  Filled 2023-08-05: qty 8, 28d supply, fill #0

## 2023-08-06 ENCOUNTER — Other Ambulatory Visit (HOSPITAL_COMMUNITY): Payer: Self-pay

## 2023-08-07 ENCOUNTER — Other Ambulatory Visit (HOSPITAL_COMMUNITY): Payer: Self-pay

## 2023-08-17 ENCOUNTER — Other Ambulatory Visit (HOSPITAL_COMMUNITY): Payer: Self-pay

## 2023-09-16 ENCOUNTER — Other Ambulatory Visit (HOSPITAL_COMMUNITY): Payer: Self-pay

## 2023-09-21 ENCOUNTER — Ambulatory Visit: Payer: BC Managed Care – PPO | Admitting: Student

## 2023-09-23 NOTE — Progress Notes (Signed)
 Electrophysiology Office Note:   Date:  09/25/2023  ID:  Kristin Bradford, DOB 07/20/1955, MRN [id number]  Primary Cardiologist: Avery Bodo, MD Electrophysiologist: Ardeen Kohler, MD      History of Present Illness:   Kristin Bradford is a 68 y.o. female with h/o GERD, anxiety, PACs, PVCs and paroxysmal atrial fibrillation who is being seen today for follow-up evaluation. Atrial fibrillation first occurred in 2021 in the post-operative setting.  She has had intermittent episodes over the years. Now s/p PVI and posterior wall isolation on 06/22/23.  Discussed the use of AI scribe software for clinical note transcription with the patient, who gave verbal consent to proceed.  History of Present Illness Kristin Bradford is a 68 year old female with atrial fibrillation who presents for follow-up after an ablation procedure. Since the procedure, she has not experienced any episodes of atrial fibrillation, as confirmed by her watch. However, she occasionally feels 'flipping' sensations and brief heart rate rushes lasting about ten seconds. She is currently taking metoprolol  XL once daily and has a short-acting version available as needed, which she has not used since her last episode. She still experiences some fatigue with activity. Previously, she walked a mile and a half daily but now struggles to complete a mile before needing to rest. She is also worried about the impact of stress, noting her mother's recent passing and increased work hours, which have contributed to her fatigue. Otherwise doing well. No SOB, CP, or swelling in her legs has been reported. She actively monitors her heart rhythm with her watch.  Review of systems complete and found to be negative unless listed in HPI.   EP Information / Studies Reviewed:    EKG is ordered today. Personal review as below.  EKG Interpretation Date/Time:  Thursday September 24 2023 11:37:56 EDT Ventricular Rate:  69 PR Interval:  80 QRS Duration:  84 QT  Interval:  402 QTC Calculation: 430 R Axis:   70  Text Interpretation: Ectopic atrial rhythm with short PR When compared with ECG of 20-Jul-2023 14:26, No significant change Confirmed by Ardeen Kohler [phone]) on 09/25/2023 9:36:26 AM   EKG from 05/21/23 reviewed which showed AF w/ RVR.       Coronary CTA 10/29/21: 1. Left Main: No significant stenosis   2. LAD: CTFFR 0.93 across lesion in proximal LAD   3. LCX: No significant stenosis   4. RCA: No significant stenosis   IMPRESSION: 1.  CTFFR suggests nonobstructive CAD   Echo 09/06/21: Normal LV size and function.  LVEF 55 to 60%. Normal RV size and function. Mildly dilated left atrium.  Right atrium normal in size. No significant valvular disease.  Risk Assessment/Calculations:    CHA2DS2-VASc Score = 5   This indicates a 7.2% annual risk of stroke. The patient's score is based upon: CHF History: 0 HTN History: 0 Diabetes History: 0 Stroke History: 2 Vascular Disease History: 1 Age Score: 1 Gender Score: 1             Physical Exam:   VS:  BP 118/72   Pulse 69   Ht 5\' 10"  (1.778 m)   Wt 189 lb (85.7 kg)   SpO2 99%   BMI 27.12 kg/m    Wt Readings from Last 3 Encounters:  09/24/23 189 lb (85.7 kg)  07/20/23 187 lb 3.2 oz (84.9 kg)  06/22/23 185 lb (83.9 kg)     GEN: Well nourished, well developed in no acute distress NECK: No JVD CARDIAC:  Normal rate, regular rhythm RESPIRATORY:  Clear to auscultation without rales, wheezing or rhonchi  ABDOMEN: Soft, non-distended EXTREMITIES:  No edema; No deformity   ASSESSMENT AND PLAN:    #Paroxysmal atrial fibrillation, symptomatic: S/p PVI and posterior wall ablation on 06/22/2023.  She has done well with no known recurrence of atrial fibrillation.  - Stop daily scheduled metoprolol  given her ongoing fatigue despite sinus rhythm.  She will have metoprolol  tartrate available as needed.   #Secondary hypercoagulable state due to atrial fibrillation: She has  evidence of prior cerebellar infarcts on Brain MRI. -CHADSVASC score of 4. -Continue Xarelto  20mg  once daily.   #Ectopic atrial rhythm with short PR vs sinus with competing junctional: She has no obvious preexcitation on her EKG, with normal QRS duration.  She had an HV interval measured at 39 ms during ablation.  No arrhythmias were induced.    Follow up with Dr. Daneil Dunker in 12 months  Signed, Ardeen Kohler, MD

## 2023-09-24 ENCOUNTER — Encounter: Payer: Self-pay | Admitting: Cardiology

## 2023-09-24 ENCOUNTER — Ambulatory Visit: Payer: BC Managed Care – PPO | Attending: Cardiology | Admitting: Cardiology

## 2023-09-24 VITALS — BP 118/72 | HR 69 | Ht 70.0 in | Wt 189.0 lb

## 2023-09-24 DIAGNOSIS — I480 Paroxysmal atrial fibrillation: Secondary | ICD-10-CM | POA: Diagnosis not present

## 2023-09-24 DIAGNOSIS — D6869 Other thrombophilia: Secondary | ICD-10-CM | POA: Diagnosis not present

## 2023-09-24 NOTE — Patient Instructions (Signed)
 Medication Instructions:  Your physician has recommended you make the following change in your medication:  1) STOP taking metoprolol  succinate (Toprol  XL) *If you need a refill on your cardiac medications before your next appointment, please call your pharmacy*  Follow-Up: At [facility], you and your health needs are our priority.  As part of our continuing mission to provide you with exceptional heart care, our providers are all part of one team.  This team includes your primary Cardiologist (physician) and Advanced Practice Providers or APPs (Physician Assistants and Nurse Practitioners) who all work together to provide you with the care you need, when you need it.  Your next appointment:   1 year  Provider:   You may see Ardeen Kohler, MD or one of the following Advanced Practice Providers on your designated Care Team:   Mertha Abrahams, [place] Bambi Lever "Jonelle Neri" [place], PA-C Suzann Riddle, NP Creighton Doffing, NP       1st Floor: - Lobby - Registration  - Pharmacy  - Lab - Cafe  2nd Floor: - PV Lab - Diagnostic Testing (echo, CT, nuclear med)  3rd Floor: - Vacant  4th Floor: - TCTS (cardiothoracic surgery) - AFib Clinic - Structural Heart Clinic - Vascular Surgery  - Vascular Ultrasound  5th Floor: - [facility]Care Cardiology (general and EP) - Clinical Pharmacy for coumadin, hypertension, lipid, weight-loss medications, and med management appointments    Valet parking services will be available as well.

## 2023-09-29 DIAGNOSIS — H04123 Dry eye syndrome of bilateral lacrimal glands: Secondary | ICD-10-CM | POA: Diagnosis not present

## 2023-09-29 DIAGNOSIS — Z961 Presence of intraocular lens: Secondary | ICD-10-CM | POA: Diagnosis not present

## 2023-09-29 DIAGNOSIS — H43813 Vitreous degeneration, bilateral: Secondary | ICD-10-CM | POA: Diagnosis not present

## 2023-09-29 DIAGNOSIS — H5213 Myopia, bilateral: Secondary | ICD-10-CM | POA: Diagnosis not present

## 2023-10-12 ENCOUNTER — Other Ambulatory Visit (HOSPITAL_COMMUNITY): Payer: Self-pay

## 2023-10-14 ENCOUNTER — Other Ambulatory Visit: Payer: Self-pay

## 2023-10-14 DIAGNOSIS — F411 Generalized anxiety disorder: Secondary | ICD-10-CM | POA: Diagnosis not present

## 2023-10-14 DIAGNOSIS — I2510 Atherosclerotic heart disease of native coronary artery without angina pectoris: Secondary | ICD-10-CM | POA: Diagnosis not present

## 2023-10-14 DIAGNOSIS — I480 Paroxysmal atrial fibrillation: Secondary | ICD-10-CM | POA: Diagnosis not present

## 2023-10-14 DIAGNOSIS — E039 Hypothyroidism, unspecified: Secondary | ICD-10-CM | POA: Diagnosis not present

## 2023-10-14 DIAGNOSIS — E782 Mixed hyperlipidemia: Secondary | ICD-10-CM | POA: Diagnosis not present

## 2023-10-15 ENCOUNTER — Other Ambulatory Visit: Payer: Self-pay | Admitting: Family Medicine

## 2023-10-15 DIAGNOSIS — R1032 Left lower quadrant pain: Secondary | ICD-10-CM

## 2023-10-16 ENCOUNTER — Other Ambulatory Visit: Payer: Self-pay

## 2023-10-16 ENCOUNTER — Other Ambulatory Visit (HOSPITAL_COMMUNITY): Payer: Self-pay

## 2023-10-16 MED ORDER — ROSUVASTATIN CALCIUM 10 MG PO TABS
10.0000 mg | ORAL_TABLET | Freq: Every day | ORAL | 1 refills | Status: DC
  Filled 2023-10-16: qty 30, 30d supply, fill #0
  Filled 2023-11-14: qty 30, 30d supply, fill #1
  Filled 2023-12-12: qty 30, 30d supply, fill #2
  Filled 2024-01-12: qty 30, 30d supply, fill #3
  Filled 2024-02-08: qty 30, 30d supply, fill #4
  Filled 2024-03-08: qty 30, 30d supply, fill #5

## 2023-10-27 DIAGNOSIS — Z01419 Encounter for gynecological examination (general) (routine) without abnormal findings: Secondary | ICD-10-CM | POA: Diagnosis not present

## 2023-10-27 DIAGNOSIS — Z6827 Body mass index (BMI) 27.0-27.9, adult: Secondary | ICD-10-CM | POA: Diagnosis not present

## 2023-10-27 DIAGNOSIS — Z124 Encounter for screening for malignant neoplasm of cervix: Secondary | ICD-10-CM | POA: Diagnosis not present

## 2023-10-27 DIAGNOSIS — Z1231 Encounter for screening mammogram for malignant neoplasm of breast: Secondary | ICD-10-CM | POA: Diagnosis not present

## 2023-10-27 DIAGNOSIS — Z1151 Encounter for screening for human papillomavirus (HPV): Secondary | ICD-10-CM | POA: Diagnosis not present

## 2023-10-28 ENCOUNTER — Other Ambulatory Visit: Payer: Self-pay

## 2023-10-28 ENCOUNTER — Other Ambulatory Visit (HOSPITAL_COMMUNITY): Payer: Self-pay

## 2023-10-28 ENCOUNTER — Ambulatory Visit
Admission: RE | Admit: 2023-10-28 | Discharge: 2023-10-28 | Disposition: A | Discharge status: Home / Self Care | Source: Ambulatory Visit | Attending: Family Medicine | Admitting: Family Medicine

## 2023-10-28 DIAGNOSIS — R1032 Left lower quadrant pain: Secondary | ICD-10-CM

## 2023-10-28 MED ORDER — ESTRADIOL 0.1 MG/GM VA CREA
TOPICAL_CREAM | VAGINAL | 3 refills | Status: AC
  Filled 2023-10-28: qty 42.5, 30d supply, fill #0
  Filled 2024-01-05: qty 127.5, 30d supply, fill #1
  Filled 2024-01-05: qty 42.5, 30d supply, fill #1

## 2023-10-28 MED ORDER — IOPAMIDOL (ISOVUE-300) INJECTION 61%
100.0000 mL | Freq: Once | INTRAVENOUS | Status: AC | PRN
  Administered 2023-10-28: 100 mL via INTRAVENOUS

## 2023-11-06 ENCOUNTER — Other Ambulatory Visit: Payer: Self-pay

## 2023-11-06 ENCOUNTER — Other Ambulatory Visit (HOSPITAL_BASED_OUTPATIENT_CLINIC_OR_DEPARTMENT_OTHER): Payer: Self-pay

## 2023-11-06 DIAGNOSIS — N3010 Interstitial cystitis (chronic) without hematuria: Secondary | ICD-10-CM | POA: Diagnosis not present

## 2023-11-06 DIAGNOSIS — R102 Pelvic and perineal pain: Secondary | ICD-10-CM | POA: Diagnosis not present

## 2023-11-06 MED ORDER — HYDROXYZINE HCL 10 MG PO TABS
10.0000 mg | ORAL_TABLET | Freq: Every day | ORAL | 3 refills | Status: AC
  Filled 2023-11-06: qty 30, 30d supply, fill #0

## 2023-11-09 ENCOUNTER — Other Ambulatory Visit (HOSPITAL_COMMUNITY): Payer: Self-pay

## 2023-11-10 ENCOUNTER — Other Ambulatory Visit (HOSPITAL_COMMUNITY): Payer: Self-pay

## 2023-11-12 ENCOUNTER — Telehealth: Payer: Self-pay | Admitting: Cardiology

## 2023-11-12 NOTE — Telephone Encounter (Signed)
   Pre-operative Risk Assessment    Patient Name: Kristin Bradford  DOB: 11-16-1955 MRN: [id number]   Date of last office visit: 09/24/23  Date of next office visit: nothing scheduled    Request for Surgical Clearance    Procedure:  Cystoscopy with Hydrodistention   Date of Surgery:  Clearance 12/29/23                                Surgeon:  Dr. Glendia Lands Group or Practice Name:  [facility]  Phone number:  [phone]  Fax number:  [phone]    Type of Clearance Requested:   - Medical  - Pharmacy:  Hold Rivaroxaban  (Xarelto ) requesting 3 days prior    Type of Anesthesia:  General    Additional requests/questions:    Virgie Griffith   11/12/2023, 4:16 PM

## 2023-11-14 ENCOUNTER — Other Ambulatory Visit (HOSPITAL_COMMUNITY): Payer: Self-pay

## 2023-11-14 ENCOUNTER — Other Ambulatory Visit: Payer: Self-pay | Admitting: Cardiology

## 2023-11-14 ENCOUNTER — Other Ambulatory Visit: Payer: Self-pay

## 2023-11-14 ENCOUNTER — Other Ambulatory Visit (HOSPITAL_BASED_OUTPATIENT_CLINIC_OR_DEPARTMENT_OTHER): Payer: Self-pay

## 2023-11-14 MED ORDER — RIVAROXABAN 20 MG PO TABS
20.0000 mg | ORAL_TABLET | Freq: Every day | ORAL | 1 refills | Status: DC
  Filled 2023-11-14: qty 30, 30d supply, fill #0
  Filled 2023-12-12: qty 30, 30d supply, fill #1
  Filled 2024-01-12: qty 30, 30d supply, fill #2
  Filled 2024-02-08: qty 30, 30d supply, fill #3
  Filled 2024-03-08: qty 30, 30d supply, fill #4
  Filled 2024-04-05: qty 30, 30d supply, fill #5

## 2023-11-16 NOTE — Telephone Encounter (Signed)
 Patient with diagnosis of Afib on Xarelto  for anticoagulation.    Procedure:  Cystoscopy with Hydrodistention  Date of procedure: 12/09/23   CHA2DS2-VASc Score = 5   This indicates a 7.2% annual risk of stroke. The patient's score is based upon: CHF History: 0 HTN History: 0 Diabetes History: 0 Stroke History: 2 Vascular Disease History: 1 Age Score: 1 Gender Score: 1   CrCl 73 mL/min Platelet count 295 K  Patient has not  had an Afib/aflutter ablation within the last 3 months or DCCV within the last 30 days   Per office protocol, patient can hold Xarelto  for 3 days prior to procedure.   Patient will not need bridging with Lovenox  (enoxaparin ) around procedure.  **This guidance is not considered finalized until pre-operative APP has relayed final recommendations.**

## 2023-11-20 NOTE — Telephone Encounter (Signed)
   Patient Name: Kristin Bradford  DOB: January 09, 1956 MRN: [id number]  Primary Cardiologist: Avery Bodo, MD  Chart reviewed as part of pre-operative protocol coverage. Given past medical history and time since last visit, based on ACC/AHA guidelines, MELAT WRISLEY is at acceptable risk for the planned procedure without further cardiovascular testing.   Patient with diagnosis of Afib on Xarelto  for anticoagulation.     Procedure:  Cystoscopy with Hydrodistention  Date of procedure: 12/09/23     CHA2DS2-VASc Score = 5   This indicates a 7.2% annual risk of stroke. The patient's score is based upon: CHF History: 0 HTN History: 0 Diabetes History: 0 Stroke History: 2 Vascular Disease History: 1 Age Score: 1 Gender Score: 1     CrCl 73 mL/min Platelet count 295 K   Patient has not  had an Afib/aflutter ablation within the last 3 months or DCCV within the last 30 days     Per office protocol, patient can hold Xarelto  for 3 days prior to procedure.   Patient will not need bridging with Lovenox  (enoxaparin ) around procedure.      The patient was advised that if she develops new symptoms prior to surgery to contact our office to arrange for a follow-up visit, and she verbalized understanding.  I will route this recommendation to the requesting party via Epic fax function and remove from pre-op pool.  Please call with questions.  Friddie Jetty, NP 11/20/2023, 10:53 AM

## 2023-11-24 ENCOUNTER — Telehealth: Payer: Self-pay | Admitting: Cardiology

## 2023-11-24 NOTE — Telephone Encounter (Signed)
 Pt c/o medication issue:  1. Name of Medication:   rivaroxaban  (XARELTO ) 20 MG TABS tablet    2. How are you currently taking this medication (dosage and times per day)? As written  3. Are you having a reaction (difficulty breathing--STAT)? no  4. What is your medication issue? What is going to have a tattoo done and wants to make sure it is ok with taking this medication

## 2023-11-24 NOTE — Telephone Encounter (Signed)
 Spoke with the patient and advised that from Dr. Shaune perspective she is fine to get a tattoo while on xarelto  but to make her tattoo artist aware.

## 2023-12-08 ENCOUNTER — Other Ambulatory Visit (HOSPITAL_COMMUNITY): Payer: Self-pay

## 2023-12-09 ENCOUNTER — Other Ambulatory Visit: Payer: Self-pay

## 2023-12-09 ENCOUNTER — Other Ambulatory Visit (HOSPITAL_COMMUNITY): Payer: Self-pay

## 2023-12-09 MED ORDER — PROGESTERONE MICRONIZED 100 MG PO CAPS
100.0000 mg | ORAL_CAPSULE | Freq: Every day | ORAL | 11 refills | Status: AC
  Filled 2023-12-09: qty 30, 30d supply, fill #0
  Filled 2024-01-12: qty 30, 30d supply, fill #1
  Filled 2024-02-08: qty 30, 30d supply, fill #2
  Filled 2024-03-08: qty 30, 30d supply, fill #3
  Filled 2024-04-05: qty 30, 30d supply, fill #4
  Filled 2024-05-03: qty 30, 30d supply, fill #5
  Filled 2024-05-24 – 2024-05-27 (×2): qty 30, 30d supply, fill #6
  Filled 2024-06-22: qty 30, 30d supply, fill #7

## 2023-12-12 ENCOUNTER — Other Ambulatory Visit (HOSPITAL_COMMUNITY): Payer: Self-pay

## 2023-12-14 ENCOUNTER — Other Ambulatory Visit (HOSPITAL_COMMUNITY): Payer: Self-pay

## 2023-12-29 DIAGNOSIS — N3020 Other chronic cystitis without hematuria: Secondary | ICD-10-CM | POA: Diagnosis not present

## 2023-12-29 DIAGNOSIS — N3010 Interstitial cystitis (chronic) without hematuria: Secondary | ICD-10-CM | POA: Diagnosis not present

## 2024-01-05 ENCOUNTER — Other Ambulatory Visit (HOSPITAL_COMMUNITY): Payer: Self-pay

## 2024-01-13 ENCOUNTER — Other Ambulatory Visit (HOSPITAL_COMMUNITY): Payer: Self-pay

## 2024-01-20 DIAGNOSIS — R351 Nocturia: Secondary | ICD-10-CM | POA: Diagnosis not present

## 2024-01-20 DIAGNOSIS — N3010 Interstitial cystitis (chronic) without hematuria: Secondary | ICD-10-CM | POA: Diagnosis not present

## 2024-01-20 DIAGNOSIS — R102 Pelvic and perineal pain: Secondary | ICD-10-CM | POA: Diagnosis not present

## 2024-01-28 DIAGNOSIS — N3000 Acute cystitis without hematuria: Secondary | ICD-10-CM | POA: Diagnosis not present

## 2024-02-04 DIAGNOSIS — N3010 Interstitial cystitis (chronic) without hematuria: Secondary | ICD-10-CM | POA: Diagnosis not present

## 2024-02-08 ENCOUNTER — Other Ambulatory Visit (HOSPITAL_COMMUNITY): Payer: Self-pay

## 2024-02-18 DIAGNOSIS — N3010 Interstitial cystitis (chronic) without hematuria: Secondary | ICD-10-CM | POA: Diagnosis not present

## 2024-02-25 DIAGNOSIS — N3010 Interstitial cystitis (chronic) without hematuria: Secondary | ICD-10-CM | POA: Diagnosis not present

## 2024-03-08 ENCOUNTER — Other Ambulatory Visit (HOSPITAL_COMMUNITY): Payer: Self-pay

## 2024-03-21 ENCOUNTER — Other Ambulatory Visit (HOSPITAL_COMMUNITY): Payer: Self-pay | Admitting: Specialist

## 2024-03-21 DIAGNOSIS — M5459 Other low back pain: Secondary | ICD-10-CM | POA: Diagnosis not present

## 2024-03-21 DIAGNOSIS — M25552 Pain in left hip: Secondary | ICD-10-CM

## 2024-03-22 ENCOUNTER — Other Ambulatory Visit: Payer: Self-pay | Admitting: Specialist

## 2024-03-22 DIAGNOSIS — M5459 Other low back pain: Secondary | ICD-10-CM

## 2024-03-28 ENCOUNTER — Encounter (HOSPITAL_COMMUNITY): Payer: Self-pay

## 2024-03-28 ENCOUNTER — Encounter (HOSPITAL_COMMUNITY)

## 2024-03-30 DIAGNOSIS — M5416 Radiculopathy, lumbar region: Secondary | ICD-10-CM | POA: Diagnosis not present

## 2024-03-31 ENCOUNTER — Other Ambulatory Visit: Payer: Self-pay

## 2024-03-31 ENCOUNTER — Other Ambulatory Visit (HOSPITAL_COMMUNITY): Payer: Self-pay

## 2024-03-31 DIAGNOSIS — M25552 Pain in left hip: Secondary | ICD-10-CM | POA: Diagnosis not present

## 2024-03-31 DIAGNOSIS — M5459 Other low back pain: Secondary | ICD-10-CM | POA: Diagnosis not present

## 2024-03-31 MED ORDER — PREDNISONE 5 MG PO TABS
5.0000 mg | ORAL_TABLET | Freq: Every day | ORAL | 1 refills | Status: AC | PRN
  Filled 2024-03-31: qty 30, 30d supply, fill #0

## 2024-04-04 ENCOUNTER — Encounter (HOSPITAL_COMMUNITY)
Admission: RE | Admit: 2024-04-04 | Discharge: 2024-04-04 | Disposition: A | Discharge status: Home / Self Care | Source: Ambulatory Visit | Attending: Specialist | Admitting: Specialist

## 2024-04-04 DIAGNOSIS — M25552 Pain in left hip: Secondary | ICD-10-CM | POA: Insufficient documentation

## 2024-04-04 DIAGNOSIS — Z96642 Presence of left artificial hip joint: Secondary | ICD-10-CM | POA: Diagnosis not present

## 2024-04-04 MED ORDER — TECHNETIUM TC 99M MEDRONATE IV KIT
20.0000 | PACK | Freq: Once | INTRAVENOUS | Status: AC | PRN
Start: 2024-04-04 — End: 2024-04-04
  Administered 2024-04-04: 19.8 via INTRAVENOUS

## 2024-04-05 ENCOUNTER — Other Ambulatory Visit (HOSPITAL_COMMUNITY): Payer: Self-pay

## 2024-04-06 ENCOUNTER — Other Ambulatory Visit (HOSPITAL_COMMUNITY): Payer: Self-pay

## 2024-04-06 MED ORDER — ROSUVASTATIN CALCIUM 10 MG PO TABS
10.0000 mg | ORAL_TABLET | Freq: Every day | ORAL | 0 refills | Status: DC
  Filled 2024-04-06: qty 30, 30d supply, fill #0

## 2024-04-11 DIAGNOSIS — M545 Low back pain, unspecified: Secondary | ICD-10-CM | POA: Diagnosis not present

## 2024-04-13 DIAGNOSIS — M545 Low back pain, unspecified: Secondary | ICD-10-CM | POA: Diagnosis not present

## 2024-04-15 NOTE — Progress Notes (Signed)
 Cardiology Office Note:    Date:  04/21/2024   ID:  Kristin Bradford, DOB [birth year], MRN [id number]  PCP:  Loreli Kins, MD   [facility] Providers Cardiologist:  Candyce Reek, MD Electrophysiologist:  Fonda Kitty, MD     Referring MD: Loreli Kins, MD   Chief Complaint  Patient presents with   Atrial Fibrillation     History of Present Illness:    Kristin Bradford is a 68 y.o. female with a hx of atrial fibrillation seen to establish care. Former patient of Dr Reek. Atrial fibrillation first occurred in 2021 in the post-operative setting.  She has had intermittent episodes over the years. Now s/p PVI and posterior wall isolation on 06/22/23 by Dr Kitty. She has a history of cerebellar infarcts noted on imaging and is on chronic anticoagulation. She had coronary CTA in 2023 showing mild nonobstructive CAD. Echo has been OK.   She has done well since ablation with some brief episodes of arrhythmia lasting 15 seconds. No chest pain or SOB. LDL in May was 109. Crestor  increased to 10 mg. Labs repeated yesterday. She reports she hasn't been walking as much recently and has gained 10 lbs.   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   Acquired deafness of left ear    S/P  RESECTION ACOUSTIC NEUROMA  2004   Acquired facial asymmetry    post surgery   Angio-edema    Anxiety 05/21/2023   Atrial fibrillation (HCC) 05/18/2015   Fibroids 03/30/2019   GERD (gastroesophageal reflux disease)    Gestational diabetes mellitus    gestastional   Hyperlipidemia    Hypothyroidism    hx of no meds now   IC (interstitial cystitis)    Migraine    q several months (05/22/2015)   Neuromuscular disorder (HCC)    tremor to right side when upset    PONV (postoperative nausea and vomiting)    atrial fib   Premature atrial contractions    PVC's (premature ventricular contractions)    Recurrent oral ulcers 06/30/2019   S/P myomectomy  03/30/2019   Seasonal and perennial allergic rhinitis 06/30/2019   SVT (supraventricular tachycardia)    Syncope    Urticaria     Past Surgical History:  Procedure Laterality Date   ACOUSTIC NEUROMA RESECTION Left 2004   ATRIAL FIBRILLATION ABLATION N/A 06/22/2023   Procedure: ATRIAL FIBRILLATION ABLATION;  Surgeon: Kitty Fonda, MD;  Location: [facility];  Service: Cardiovascular;  Laterality: N/A;   BRAIN SURGERY     CATARACT EXTRACTION W/ INTRAOCULAR LENS IMPLANT Bilateral 01/2014   COLONOSCOPY  11/2014   CYSTO WITH HYDRODISTENSION N/A 05/18/2015   Procedure: CYSTOSCOPY/HYDRODISTENSION;  Surgeon: Arlena Gal, MD;  Location: [facility];  Service: Urology;  Laterality: N/A;   CYSTO WITH HYDRODISTENSION N/A 03/30/2019   Procedure: CYSTOSCOPY/HYDRODISTENSION;  Surgeon: Alvaro Hummer, MD;  Location: [facility];  Service: Urology;  Laterality: N/A;   CYSTO/  HYDRODISTENTION/  INSTILLATION THERAPY  x4  last one 05-16-2010   since 1990's   ESOPHAGOGASTRODUODENOSCOPY  11/2014   HEMANGIOMA EXCISION Left 2005    FACIAL ANGIOMA REMOVAL / MASTOIDECTOMY   HEMORRHOID BANDING  05/2015   MASTOIDECTOMY Left 2005   LEFT FACIAL ANGIOMA REMOVAL /  LEFT MASTOIDECTOMY [Other]   MYOMECTOMY N/A 03/30/2019   Procedure: ABDOMINAL MYOMECTOMY;  Surgeon: Mat Browning, MD;  Location: [facility];  Service: Gynecology;  Laterality: N/A;   OVARIAN CYST SURGERY Left  TOTAL HIP ARTHROPLASTY Left 10/24/2016   Procedure: LEFT TOTAL HIP ARTHROPLASTY ANTERIOR APPROACH;  Surgeon: Vernetta Lonni GRADE, MD;  Location: [facility];  Service: Orthopedics;  Laterality: Left;   WISDOM TOOTH EXTRACTION  1980   all 4    Current Medications: Current Meds  Medication Sig   ALPRAZolam  (XANAX ) 0.5 MG tablet Take 0.25 mg by mouth daily as needed for anxiety.    COLLAGEN PO Take 0.5 Scoops by mouth daily.   diazepam  (VALIUM ) 10 MG tablet Place 1 tablet in vagina at night before bed twice a week    estradiol  (ESTRACE ) 0.1 MG/GM vaginal cream Place 1 g of cream in the vagina nightly for 14 days then twice a week thereafter   estradiol  (ESTRACE ) 0.1 MG/GM vaginal cream Insert 0.5 g vaginally three times a week.   hydrOXYzine  (ATARAX ) 10 MG tablet Take 1 tablet (10 mg total) by mouth at bedtime.   metoprolol  tartrate (LOPRESSOR ) 25 MG tablet Take 1/2 tablet (12.5 mg total) by mouth daily as needed for breakthrough palpitations (Patient taking differently: Take 12.5 mg by mouth daily.)   phenazopyridine  (PYRIDIUM ) 100 MG tablet Take 1 tablet by mouth 3 times daily as needed   polyethylene glycol (MIRALAX  / GLYCOLAX ) packet Take 17 g by mouth daily.   Polyethylene Glycol 400 (BLINK TEARS OP) Place 1 drop into both eyes daily.   predniSONE (DELTASONE) 5 MG tablet Take 1 tablet (5 mg total) by mouth daily as needed.   progesterone  (PROMETRIUM ) 100 MG capsule Take 1 capsule (100 mg total) by mouth daily.   rivaroxaban  (XARELTO ) 20 MG TABS tablet Take 1 tablet (20 mg total) by mouth daily with supper.   rosuvastatin  (CRESTOR ) 10 MG tablet Take 1 tablet (10 mg total) by mouth daily.   [DISCONTINUED] meloxicam  (MOBIC ) 7.5 MG tablet Take 1 tablet (7.5 mg total) by mouth 2 (two) times daily for 2 weeks and then as needed. (Patient taking differently: Take 7.5 mg by mouth 2 (two) times daily as needed for pain.)     Allergies:   Fentanyl , Betadine [povidone iodine], Codeine, Other, Penicillins, Sulfa antibiotics, Tetanus immune globulin, and Tetanus toxoid-containing vaccines   Social History   Socioeconomic History   Marital status: Legally Separated    Spouse name: Not on file   Number of children: Not on file   Years of education: Not on file   Highest education level: Not on file  Occupational History   Not on file  Tobacco Use   Smoking status: Never   Smokeless tobacco: Never   Tobacco comments:    Never smoked 07/20/23  Vaping Use   Vaping status: Never Used  Substance and Sexual  Activity   Alcohol use: Yes    Alcohol/week: 1.0 standard drink of alcohol    Types: 1 Glasses of wine per week    Comment: 1 glass of wine weekly 07/20/23   Drug use: No   Sexual activity: Not Currently    Birth control/protection: None  Other Topics Concern   Not on file  Social History Narrative   Not on file   Social Drivers of Health   Financial Resource Strain: Not on file  Food Insecurity: No Food Insecurity (05/22/2023)   Hunger Vital Sign    Worried About Running Out of Food in the Last Year: Never true    Ran Out of Food in the Last Year: Never true  Transportation Needs: No Transportation Needs (05/22/2023)   PRAPARE - Transportation    Lack  of Transportation (Medical): No    Lack of Transportation (Non-Medical): No  Physical Activity: Not on file  Stress: Not on file  Social Connections: Not on file     Family History: The patient's family history is not on file. She was adopted.  ROS:   Please see the history of present illness.     All other systems reviewed and are negative.  EKGs/Labs/Other Studies Reviewed:    The following studies were reviewed today:   Recent Labs: 05/21/2023: ALT 17; Magnesium 2.1; TSH 3.524 06/01/2023: BUN 15; Creatinine, Ser 0.92; Hemoglobin 13.6; Platelets 295; Potassium 4.3; Sodium 141  Recent Lipid Panel No results found for: CHOL, TRIG, HDL, CHOLHDL, VLDL, LDLCALC, LDLDIRECT Dated 04/20/24: Hgb 12.9, TSH normal, A1c 6.1%  Risk Assessment/Calculations:    CHA2DS2-VASc Score = 5   This indicates a 7.2% annual risk of stroke. The patient's score is based upon: CHF History: 0 HTN History: 0 Diabetes History: 0 Stroke History: 2 Vascular Disease History: 1 Age Score: 1 Gender Score: 1               Physical Exam:    VS:  BP 114/70   Pulse 70   Ht 5' 10.5 (1.791 m)   Wt 195 lb 9.6 oz (88.7 kg)   SpO2 99%   BMI 27.67 kg/m     Wt Readings from Last 3 Encounters:  04/21/24 195 lb 9.6 oz  (88.7 kg)  09/24/23 189 lb (85.7 kg)  07/20/23 187 lb 3.2 oz (84.9 kg)     GEN:  Well nourished, well developed in no acute distress HEENT: Normal NECK: No JVD; No carotid bruits LYMPHATICS: No lymphadenopathy CARDIAC: RRR, no murmurs, rubs, gallops RESPIRATORY:  Clear to auscultation without rales, wheezing or rhonchi  ABDOMEN: Soft, non-tender, non-distended MUSCULOSKELETAL:  No edema; No deformity  SKIN: Warm and dry NEUROLOGIC:  Alert and oriented x 3 PSYCHIATRIC:  Normal affect   ASSESSMENT:    1. Paroxysmal atrial fibrillation (HCC)   2. Hypercoagulable state due to paroxysmal atrial fibrillation (HCC)   3. Coronary artery calcification   4. Hypercholesterolemia    PLAN:    In order of problems listed above:  Paroxysmal Afib. Now s/p ablation with minor nonsustained episodes. Metoprolol  PRN. Continue Xarelto  given history of prior CVA Coronary artery calcification. No obstructive disease on CTA. Will focus on risk factor modification Hypercholesterolemia. Initial LDL 169. Last 109. Crestor  upped to 10 mg. Will await recent lab results. Would like to see LDL at least < 70.            Medication Adjustments/Labs and Tests Ordered: Current medicines are reviewed at length with the patient today.  Concerns regarding medicines are outlined above.  No orders of the defined types were placed in this encounter.  No orders of the defined types were placed in this encounter.   There are no Patient Instructions on file for this visit.   Signed, Rosco Harriott, MD  04/21/2024 8:48 AM    [facility]

## 2024-04-18 DIAGNOSIS — M545 Low back pain, unspecified: Secondary | ICD-10-CM | POA: Diagnosis not present

## 2024-04-20 DIAGNOSIS — Z0000 Encounter for general adult medical examination without abnormal findings: Secondary | ICD-10-CM | POA: Diagnosis not present

## 2024-04-20 DIAGNOSIS — E782 Mixed hyperlipidemia: Secondary | ICD-10-CM | POA: Diagnosis not present

## 2024-04-20 DIAGNOSIS — E039 Hypothyroidism, unspecified: Secondary | ICD-10-CM | POA: Diagnosis not present

## 2024-04-20 DIAGNOSIS — I480 Paroxysmal atrial fibrillation: Secondary | ICD-10-CM | POA: Diagnosis not present

## 2024-04-20 DIAGNOSIS — Z131 Encounter for screening for diabetes mellitus: Secondary | ICD-10-CM | POA: Diagnosis not present

## 2024-04-20 DIAGNOSIS — I2510 Atherosclerotic heart disease of native coronary artery without angina pectoris: Secondary | ICD-10-CM | POA: Diagnosis not present

## 2024-04-20 LAB — LAB REPORT - SCANNED
A1c: 6.1
EGFR: 66

## 2024-04-21 ENCOUNTER — Other Ambulatory Visit (HOSPITAL_COMMUNITY): Payer: Self-pay

## 2024-04-21 ENCOUNTER — Encounter: Payer: Self-pay | Admitting: Cardiology

## 2024-04-21 ENCOUNTER — Other Ambulatory Visit: Payer: Self-pay

## 2024-04-21 ENCOUNTER — Ambulatory Visit: Attending: Cardiology | Admitting: Cardiology

## 2024-04-21 VITALS — BP 114/70 | HR 70 | Ht 70.5 in | Wt 195.6 lb

## 2024-04-21 DIAGNOSIS — D6869 Other thrombophilia: Secondary | ICD-10-CM | POA: Diagnosis not present

## 2024-04-21 DIAGNOSIS — E7800 Pure hypercholesterolemia, unspecified: Secondary | ICD-10-CM | POA: Diagnosis not present

## 2024-04-21 DIAGNOSIS — I480 Paroxysmal atrial fibrillation: Secondary | ICD-10-CM

## 2024-04-21 DIAGNOSIS — I2510 Atherosclerotic heart disease of native coronary artery without angina pectoris: Secondary | ICD-10-CM

## 2024-04-21 MED ORDER — METOPROLOL TARTRATE 25 MG PO TABS
12.5000 mg | ORAL_TABLET | Freq: Every day | ORAL | 3 refills | Status: AC | PRN
  Filled 2024-04-21: qty 15, 30d supply, fill #0

## 2024-04-21 NOTE — Patient Instructions (Signed)
 Medication Instructions:  Continue all medications *If you need a refill on your cardiac medications before your next appointment, please call your pharmacy*  Lab Work: None ordered  Testing/Procedures: None ordered  Follow-Up: At [facility], you and your health needs are our priority.  As part of our continuing mission to provide you with exceptional heart care, our providers are all part of one team.  This team includes your primary Cardiologist (physician) and Advanced Practice Providers or APPs (Physician Assistants and Nurse Practitioners) who all work together to provide you with the care you need, when you need it.  Your next appointment:  1 year   Call in August to schedule Nov appointment     Provider:  Dr.Jordan   We recommend signing up for the patient portal called MyChart.  Sign up information is provided on this After Visit Summary.  MyChart is used to connect with patients for Virtual Visits (Telemedicine).  Patients are able to view lab/test results, encounter notes, upcoming appointments, etc.  Non-urgent messages can be sent to your provider as well.   To learn more about what you can do with MyChart, go to [email or web address].

## 2024-04-22 DIAGNOSIS — Z96642 Presence of left artificial hip joint: Secondary | ICD-10-CM | POA: Diagnosis not present

## 2024-04-22 DIAGNOSIS — M7072 Other bursitis of hip, left hip: Secondary | ICD-10-CM | POA: Diagnosis not present

## 2024-04-25 DIAGNOSIS — M545 Low back pain, unspecified: Secondary | ICD-10-CM | POA: Diagnosis not present

## 2024-04-26 ENCOUNTER — Other Ambulatory Visit (HOSPITAL_COMMUNITY): Payer: Self-pay

## 2024-04-26 ENCOUNTER — Other Ambulatory Visit: Payer: Self-pay

## 2024-04-26 DIAGNOSIS — R2243 Localized swelling, mass and lump, lower limb, bilateral: Secondary | ICD-10-CM | POA: Diagnosis not present

## 2024-04-26 DIAGNOSIS — R2241 Localized swelling, mass and lump, right lower limb: Secondary | ICD-10-CM | POA: Diagnosis not present

## 2024-04-26 DIAGNOSIS — R2242 Localized swelling, mass and lump, left lower limb: Secondary | ICD-10-CM | POA: Diagnosis not present

## 2024-04-26 MED ORDER — ROSUVASTATIN CALCIUM 20 MG PO TABS
20.0000 mg | ORAL_TABLET | Freq: Every day | ORAL | 1 refills | Status: AC
  Filled 2024-04-26: qty 30, 30d supply, fill #0
  Filled 2024-05-24: qty 30, 30d supply, fill #1
  Filled 2024-06-22: qty 30, 30d supply, fill #2

## 2024-04-27 DIAGNOSIS — M545 Low back pain, unspecified: Secondary | ICD-10-CM | POA: Diagnosis not present

## 2024-05-02 DIAGNOSIS — M545 Low back pain, unspecified: Secondary | ICD-10-CM | POA: Diagnosis not present

## 2024-05-03 ENCOUNTER — Other Ambulatory Visit: Payer: Self-pay | Admitting: Cardiology

## 2024-05-03 ENCOUNTER — Other Ambulatory Visit: Payer: Self-pay

## 2024-05-03 ENCOUNTER — Other Ambulatory Visit (HOSPITAL_COMMUNITY): Payer: Self-pay

## 2024-05-03 DIAGNOSIS — I480 Paroxysmal atrial fibrillation: Secondary | ICD-10-CM

## 2024-05-03 MED ORDER — RIVAROXABAN 20 MG PO TABS
20.0000 mg | ORAL_TABLET | Freq: Every day | ORAL | 3 refills | Status: AC
  Filled 2024-05-03: qty 30, 30d supply, fill #0
  Filled 2024-05-24 – 2024-05-27 (×2): qty 30, 30d supply, fill #1
  Filled 2024-06-22: qty 30, 30d supply, fill #2

## 2024-05-03 NOTE — Telephone Encounter (Signed)
 Xarelto  20mg  refill request received. Pt is 68 years old, weight-88.7kg, Crea-0.95 on 04/20/24 via scanned labs from [place] PCP, last seen by Dr. Jordan on 04/21/24, Diagnosis-Afib, CrCl-81.57  mL/min; Dose is appropriate based on dosing criteria. Will send in refill to requested pharmacy.

## 2024-05-04 DIAGNOSIS — M545 Low back pain, unspecified: Secondary | ICD-10-CM | POA: Diagnosis not present

## 2024-05-07 DIAGNOSIS — M25552 Pain in left hip: Secondary | ICD-10-CM | POA: Diagnosis not present

## 2024-05-09 DIAGNOSIS — M545 Low back pain, unspecified: Secondary | ICD-10-CM | POA: Diagnosis not present

## 2024-05-11 DIAGNOSIS — M545 Low back pain, unspecified: Secondary | ICD-10-CM | POA: Diagnosis not present

## 2024-05-16 DIAGNOSIS — M545 Low back pain, unspecified: Secondary | ICD-10-CM | POA: Diagnosis not present

## 2024-05-18 DIAGNOSIS — M545 Low back pain, unspecified: Secondary | ICD-10-CM | POA: Diagnosis not present

## 2024-05-19 DIAGNOSIS — M7072 Other bursitis of hip, left hip: Secondary | ICD-10-CM | POA: Diagnosis not present

## 2024-05-20 DIAGNOSIS — Z23 Encounter for immunization: Secondary | ICD-10-CM | POA: Diagnosis not present

## 2024-05-24 ENCOUNTER — Other Ambulatory Visit (HOSPITAL_COMMUNITY): Payer: Self-pay

## 2024-05-24 ENCOUNTER — Other Ambulatory Visit: Payer: Self-pay

## 2024-05-24 DIAGNOSIS — M545 Low back pain, unspecified: Secondary | ICD-10-CM | POA: Diagnosis not present

## 2024-05-30 DIAGNOSIS — M545 Low back pain, unspecified: Secondary | ICD-10-CM | POA: Diagnosis not present

## 2024-06-01 DIAGNOSIS — M545 Low back pain, unspecified: Secondary | ICD-10-CM | POA: Diagnosis not present

## 2024-06-01 DIAGNOSIS — M7072 Other bursitis of hip, left hip: Secondary | ICD-10-CM | POA: Diagnosis not present

## 2024-06-22 ENCOUNTER — Other Ambulatory Visit: Payer: Self-pay

## 2024-06-22 ENCOUNTER — Other Ambulatory Visit (HOSPITAL_COMMUNITY): Payer: Self-pay
